# Patient Record
Sex: Female | Born: 1975 | Race: Black or African American | Hispanic: No | Marital: Single | Smoking: Never smoker
Health system: Southern US, Community
[De-identification: ages and names within clinical notes are randomized; demographics above are authoritative.]

## PROBLEM LIST (undated history)

## (undated) DIAGNOSIS — D219 Benign neoplasm of connective and other soft tissue, unspecified: Secondary | ICD-10-CM

## (undated) DIAGNOSIS — D649 Anemia, unspecified: Secondary | ICD-10-CM

## (undated) DIAGNOSIS — N920 Excessive and frequent menstruation with regular cycle: Secondary | ICD-10-CM

## (undated) DIAGNOSIS — G43909 Migraine, unspecified, not intractable, without status migrainosus: Secondary | ICD-10-CM

## (undated) DIAGNOSIS — R87629 Unspecified abnormal cytological findings in specimens from vagina: Secondary | ICD-10-CM

## (undated) HISTORY — PX: WISDOM TOOTH EXTRACTION: SHX21

## (undated) HISTORY — DX: Excessive and frequent menstruation with regular cycle: N92.0

## (undated) HISTORY — DX: Migraine, unspecified, not intractable, without status migrainosus: G43.909

## (undated) HISTORY — DX: Benign neoplasm of connective and other soft tissue, unspecified: D21.9

## (undated) HISTORY — PX: OTHER SURGICAL HISTORY: SHX169

---

## 1998-05-11 ENCOUNTER — Inpatient Hospital Stay (HOSPITAL_COMMUNITY): Admission: AD | Admit: 1998-05-11 | Discharge: 1998-05-11 | Payer: Self-pay | Admitting: *Deleted

## 1998-06-02 ENCOUNTER — Ambulatory Visit (HOSPITAL_COMMUNITY): Admission: RE | Admit: 1998-06-02 | Discharge: 1998-06-02 | Payer: Self-pay | Admitting: Obstetrics

## 1998-06-12 ENCOUNTER — Encounter: Admission: RE | Admit: 1998-06-12 | Discharge: 1998-09-10 | Payer: Self-pay | Admitting: Obstetrics

## 1998-07-03 ENCOUNTER — Ambulatory Visit (HOSPITAL_COMMUNITY): Admission: RE | Admit: 1998-07-03 | Discharge: 1998-07-03 | Payer: Self-pay | Admitting: Obstetrics & Gynecology

## 1998-08-12 ENCOUNTER — Ambulatory Visit (HOSPITAL_COMMUNITY): Admission: RE | Admit: 1998-08-12 | Discharge: 1998-08-12 | Payer: Self-pay | Admitting: Obstetrics & Gynecology

## 1998-09-01 ENCOUNTER — Ambulatory Visit (HOSPITAL_COMMUNITY): Admission: RE | Admit: 1998-09-01 | Discharge: 1998-09-01 | Payer: Self-pay | Admitting: Obstetrics & Gynecology

## 1998-09-18 ENCOUNTER — Encounter: Admission: RE | Admit: 1998-09-18 | Discharge: 1998-09-18 | Payer: Self-pay | Admitting: Obstetrics

## 1998-09-22 ENCOUNTER — Ambulatory Visit (HOSPITAL_COMMUNITY): Admission: RE | Admit: 1998-09-22 | Discharge: 1998-09-22 | Payer: Self-pay | Admitting: Obstetrics & Gynecology

## 1998-09-25 ENCOUNTER — Encounter: Admission: RE | Admit: 1998-09-25 | Discharge: 1998-09-25 | Payer: Self-pay | Admitting: Obstetrics

## 1998-09-29 ENCOUNTER — Encounter (HOSPITAL_COMMUNITY): Admission: RE | Admit: 1998-09-29 | Discharge: 1998-10-22 | Payer: Self-pay | Admitting: Obstetrics

## 1998-09-30 ENCOUNTER — Inpatient Hospital Stay (HOSPITAL_COMMUNITY): Admission: AD | Admit: 1998-09-30 | Discharge: 1998-09-30 | Payer: Self-pay | Admitting: *Deleted

## 1998-10-02 ENCOUNTER — Encounter: Admission: RE | Admit: 1998-10-02 | Discharge: 1998-10-02 | Payer: Self-pay | Admitting: Obstetrics

## 1998-10-02 ENCOUNTER — Inpatient Hospital Stay (HOSPITAL_COMMUNITY): Admission: AD | Admit: 1998-10-02 | Discharge: 1998-10-02 | Payer: Self-pay | Admitting: Obstetrics & Gynecology

## 1998-10-09 ENCOUNTER — Encounter: Admission: RE | Admit: 1998-10-09 | Discharge: 1998-10-09 | Payer: Self-pay | Admitting: Obstetrics

## 1998-10-16 ENCOUNTER — Encounter: Admission: RE | Admit: 1998-10-16 | Discharge: 1998-10-16 | Payer: Self-pay | Admitting: Obstetrics

## 1998-10-20 ENCOUNTER — Inpatient Hospital Stay (HOSPITAL_COMMUNITY): Admission: AD | Admit: 1998-10-20 | Discharge: 1998-10-24 | Payer: Self-pay | Admitting: *Deleted

## 1999-01-19 ENCOUNTER — Emergency Department (HOSPITAL_COMMUNITY): Admission: EM | Admit: 1999-01-19 | Discharge: 1999-01-19 | Payer: Self-pay | Admitting: Emergency Medicine

## 1999-06-29 ENCOUNTER — Inpatient Hospital Stay (HOSPITAL_COMMUNITY): Admission: AD | Admit: 1999-06-29 | Discharge: 1999-07-02 | Payer: Self-pay | Admitting: Obstetrics

## 1999-06-30 ENCOUNTER — Encounter: Payer: Self-pay | Admitting: Obstetrics

## 1999-07-16 ENCOUNTER — Encounter: Admission: RE | Admit: 1999-07-16 | Discharge: 1999-07-16 | Payer: Self-pay | Admitting: Obstetrics

## 2000-02-19 ENCOUNTER — Emergency Department (HOSPITAL_COMMUNITY): Admission: EM | Admit: 2000-02-19 | Discharge: 2000-02-19 | Payer: Self-pay | Admitting: Emergency Medicine

## 2000-06-16 ENCOUNTER — Inpatient Hospital Stay (HOSPITAL_COMMUNITY): Admission: AD | Admit: 2000-06-16 | Discharge: 2000-06-16 | Payer: Self-pay | Admitting: *Deleted

## 2000-12-21 ENCOUNTER — Inpatient Hospital Stay (HOSPITAL_COMMUNITY): Admission: AD | Admit: 2000-12-21 | Discharge: 2000-12-21 | Payer: Self-pay | Admitting: Obstetrics

## 2000-12-27 ENCOUNTER — Other Ambulatory Visit: Admission: RE | Admit: 2000-12-27 | Discharge: 2000-12-27 | Payer: Self-pay | Admitting: Obstetrics and Gynecology

## 2001-02-22 ENCOUNTER — Other Ambulatory Visit: Admission: RE | Admit: 2001-02-22 | Discharge: 2001-02-22 | Payer: Self-pay | Admitting: Obstetrics and Gynecology

## 2001-02-22 ENCOUNTER — Encounter (INDEPENDENT_AMBULATORY_CARE_PROVIDER_SITE_OTHER): Payer: Self-pay

## 2001-05-30 ENCOUNTER — Encounter (INDEPENDENT_AMBULATORY_CARE_PROVIDER_SITE_OTHER): Payer: Self-pay | Admitting: Specialist

## 2001-05-30 ENCOUNTER — Ambulatory Visit (HOSPITAL_COMMUNITY): Admission: RE | Admit: 2001-05-30 | Discharge: 2001-05-30 | Payer: Self-pay | Admitting: Obstetrics and Gynecology

## 2001-06-19 ENCOUNTER — Emergency Department (HOSPITAL_COMMUNITY): Admission: EM | Admit: 2001-06-19 | Discharge: 2001-06-19 | Payer: Self-pay | Admitting: Emergency Medicine

## 2001-09-06 ENCOUNTER — Other Ambulatory Visit: Admission: RE | Admit: 2001-09-06 | Discharge: 2001-09-06 | Payer: Self-pay | Admitting: Obstetrics and Gynecology

## 2002-03-16 ENCOUNTER — Emergency Department (HOSPITAL_COMMUNITY): Admission: EM | Admit: 2002-03-16 | Discharge: 2002-03-17 | Payer: Self-pay | Admitting: Emergency Medicine

## 2002-08-27 ENCOUNTER — Inpatient Hospital Stay (HOSPITAL_COMMUNITY): Admission: AD | Admit: 2002-08-27 | Discharge: 2002-08-27 | Payer: Self-pay | Admitting: Obstetrics and Gynecology

## 2002-08-27 ENCOUNTER — Encounter: Payer: Self-pay | Admitting: Obstetrics and Gynecology

## 2002-08-31 ENCOUNTER — Encounter (INDEPENDENT_AMBULATORY_CARE_PROVIDER_SITE_OTHER): Payer: Self-pay | Admitting: Specialist

## 2002-08-31 ENCOUNTER — Ambulatory Visit (HOSPITAL_COMMUNITY): Admission: RE | Admit: 2002-08-31 | Discharge: 2002-08-31 | Payer: Self-pay | Admitting: Obstetrics and Gynecology

## 2002-10-12 ENCOUNTER — Other Ambulatory Visit: Admission: RE | Admit: 2002-10-12 | Discharge: 2002-10-12 | Payer: Self-pay | Admitting: Obstetrics and Gynecology

## 2003-02-19 ENCOUNTER — Encounter: Admission: RE | Admit: 2003-02-19 | Discharge: 2003-02-19 | Payer: Self-pay | Admitting: Family Medicine

## 2003-03-12 ENCOUNTER — Encounter: Admission: RE | Admit: 2003-03-12 | Discharge: 2003-03-12 | Payer: Self-pay | Admitting: Sports Medicine

## 2003-04-02 ENCOUNTER — Encounter: Admission: RE | Admit: 2003-04-02 | Discharge: 2003-04-02 | Payer: Self-pay | Admitting: Family Medicine

## 2003-04-10 ENCOUNTER — Emergency Department (HOSPITAL_COMMUNITY): Admission: EM | Admit: 2003-04-10 | Discharge: 2003-04-10 | Payer: Self-pay | Admitting: Emergency Medicine

## 2003-05-12 ENCOUNTER — Inpatient Hospital Stay (HOSPITAL_COMMUNITY): Admission: AD | Admit: 2003-05-12 | Discharge: 2003-05-12 | Payer: Self-pay | Admitting: Obstetrics and Gynecology

## 2003-05-28 ENCOUNTER — Encounter: Admission: RE | Admit: 2003-05-28 | Discharge: 2003-05-28 | Payer: Self-pay | Admitting: Family Medicine

## 2003-07-17 ENCOUNTER — Emergency Department (HOSPITAL_COMMUNITY): Admission: EM | Admit: 2003-07-17 | Discharge: 2003-07-17 | Payer: Self-pay | Admitting: Emergency Medicine

## 2003-09-03 ENCOUNTER — Encounter: Admission: RE | Admit: 2003-09-03 | Discharge: 2003-09-03 | Payer: Self-pay | Admitting: Family Medicine

## 2003-09-03 ENCOUNTER — Other Ambulatory Visit: Admission: RE | Admit: 2003-09-03 | Discharge: 2003-09-03 | Payer: Self-pay | Admitting: Family Medicine

## 2003-10-10 ENCOUNTER — Encounter: Admission: RE | Admit: 2003-10-10 | Discharge: 2003-10-10 | Payer: Self-pay | Admitting: Family Medicine

## 2003-10-29 ENCOUNTER — Encounter: Admission: RE | Admit: 2003-10-29 | Discharge: 2003-10-29 | Payer: Self-pay | Admitting: Family Medicine

## 2004-01-01 ENCOUNTER — Encounter: Admission: RE | Admit: 2004-01-01 | Discharge: 2004-01-01 | Payer: Self-pay | Admitting: Family Medicine

## 2004-02-22 ENCOUNTER — Emergency Department (HOSPITAL_COMMUNITY): Admission: AD | Admit: 2004-02-22 | Discharge: 2004-02-22 | Payer: Self-pay

## 2004-02-25 ENCOUNTER — Encounter: Admission: RE | Admit: 2004-02-25 | Discharge: 2004-02-25 | Payer: Self-pay | Admitting: Family Medicine

## 2004-03-24 ENCOUNTER — Encounter: Admission: RE | Admit: 2004-03-24 | Discharge: 2004-03-24 | Payer: Self-pay | Admitting: Sports Medicine

## 2004-05-04 ENCOUNTER — Encounter: Admission: RE | Admit: 2004-05-04 | Discharge: 2004-05-04 | Payer: Self-pay | Admitting: Family Medicine

## 2004-06-08 ENCOUNTER — Encounter: Admission: RE | Admit: 2004-06-08 | Discharge: 2004-06-08 | Payer: Self-pay | Admitting: Family Medicine

## 2004-12-15 ENCOUNTER — Ambulatory Visit: Payer: Self-pay | Admitting: Family Medicine

## 2004-12-29 ENCOUNTER — Ambulatory Visit: Payer: Self-pay | Admitting: Family Medicine

## 2005-02-19 ENCOUNTER — Other Ambulatory Visit: Admission: RE | Admit: 2005-02-19 | Discharge: 2005-02-19 | Payer: Self-pay | Admitting: Gynecology

## 2005-08-12 ENCOUNTER — Ambulatory Visit: Payer: Self-pay | Admitting: Family Medicine

## 2005-11-20 ENCOUNTER — Emergency Department (HOSPITAL_COMMUNITY): Admission: AD | Admit: 2005-11-20 | Discharge: 2005-11-20 | Payer: Self-pay | Admitting: Family Medicine

## 2006-02-02 ENCOUNTER — Other Ambulatory Visit: Admission: RE | Admit: 2006-02-02 | Discharge: 2006-02-02 | Payer: Self-pay | Admitting: Gynecology

## 2006-03-10 ENCOUNTER — Emergency Department (HOSPITAL_COMMUNITY): Admission: EM | Admit: 2006-03-10 | Discharge: 2006-03-10 | Payer: Self-pay | Admitting: Emergency Medicine

## 2006-09-14 ENCOUNTER — Inpatient Hospital Stay (HOSPITAL_COMMUNITY): Admission: AD | Admit: 2006-09-14 | Discharge: 2006-09-15 | Payer: Self-pay | Admitting: Gynecology

## 2006-10-24 ENCOUNTER — Inpatient Hospital Stay (HOSPITAL_COMMUNITY): Admission: AD | Admit: 2006-10-24 | Discharge: 2006-10-24 | Payer: Self-pay | Admitting: Gynecology

## 2007-02-09 DIAGNOSIS — E669 Obesity, unspecified: Secondary | ICD-10-CM

## 2007-02-09 DIAGNOSIS — G44209 Tension-type headache, unspecified, not intractable: Secondary | ICD-10-CM

## 2007-02-13 ENCOUNTER — Emergency Department (HOSPITAL_COMMUNITY): Admission: EM | Admit: 2007-02-13 | Discharge: 2007-02-13 | Payer: Self-pay | Admitting: Family Medicine

## 2007-05-05 ENCOUNTER — Other Ambulatory Visit: Admission: RE | Admit: 2007-05-05 | Discharge: 2007-05-05 | Payer: Self-pay | Admitting: Gynecology

## 2007-09-27 ENCOUNTER — Inpatient Hospital Stay (HOSPITAL_COMMUNITY): Admission: AD | Admit: 2007-09-27 | Discharge: 2007-09-27 | Payer: Self-pay | Admitting: Gynecology

## 2008-02-01 ENCOUNTER — Emergency Department (HOSPITAL_COMMUNITY): Admission: EM | Admit: 2008-02-01 | Discharge: 2008-02-01 | Payer: Self-pay | Admitting: Emergency Medicine

## 2008-05-08 ENCOUNTER — Other Ambulatory Visit: Admission: RE | Admit: 2008-05-08 | Discharge: 2008-05-08 | Payer: Self-pay | Admitting: Gynecology

## 2008-10-17 ENCOUNTER — Emergency Department (HOSPITAL_COMMUNITY): Admission: EM | Admit: 2008-10-17 | Discharge: 2008-10-17 | Payer: Self-pay | Admitting: Emergency Medicine

## 2009-11-24 ENCOUNTER — Emergency Department (HOSPITAL_COMMUNITY): Admission: EM | Admit: 2009-11-24 | Discharge: 2009-11-24 | Payer: Self-pay | Admitting: Emergency Medicine

## 2010-03-09 ENCOUNTER — Ambulatory Visit: Payer: Self-pay | Admitting: Gynecology

## 2010-05-20 ENCOUNTER — Other Ambulatory Visit: Admission: RE | Admit: 2010-05-20 | Discharge: 2010-05-20 | Payer: Self-pay | Admitting: Gynecology

## 2010-05-20 ENCOUNTER — Ambulatory Visit: Payer: Self-pay | Admitting: Gynecology

## 2010-11-10 ENCOUNTER — Ambulatory Visit: Payer: Self-pay | Admitting: Gynecology

## 2010-12-30 ENCOUNTER — Emergency Department (HOSPITAL_BASED_OUTPATIENT_CLINIC_OR_DEPARTMENT_OTHER)
Admission: EM | Admit: 2010-12-30 | Discharge: 2010-12-30 | Payer: Self-pay | Source: Home / Self Care | Admitting: Emergency Medicine

## 2011-03-16 LAB — BASIC METABOLIC PANEL
CO2: 26 mEq/L (ref 19–32)
Chloride: 102 mEq/L (ref 96–112)
GFR calc Af Amer: >60 mL/min (ref >60)
Potassium: 4.2 mEq/L (ref 3.5–5.1)
Sodium: 135 mEq/L (ref 135–145)

## 2011-03-16 LAB — CBC
HCT: 39.8 % (ref 36.0–46.0)
Hemoglobin: 13.7 g/dL (ref 12.0–15.0)
MCV: 93.2 fL (ref 78.0–100.0)
RBC: 4.27 MIL/uL (ref 3.87–5.11)
WBC: 7.1 10*3/uL (ref 4.0–10.5)

## 2011-03-16 LAB — D-DIMER, QUANTITATIVE: D-Dimer, Quant: 0.45 ug/mL-FEU (ref 0.00–0.48)

## 2011-04-30 NOTE — Op Note (Signed)
   NAME:  Kathy Werner, Kathy Werner                          ACCOUNT NO.:  [id number]   MEDICAL RECORD NO.:  [id number]                   PATIENT TYPE:  AMB   LOCATION:  SDC                                  FACILITY:  [facility]   PHYSICIAN:  Naima A. Dillard, M.D.              DATE OF BIRTH:  Oct 03, 1976   DATE OF PROCEDURE:  08/31/2002  DATE OF DISCHARGE:  08/31/2002                                 OPERATIVE REPORT   PREOPERATIVE DIAGNOSIS:  Retained products of conception first trimester.   POSTOPERATIVE DIAGNOSIS:  Incomplete abortion.   PROCEDURE:  Dilation and evacuation.   SURGEON:  Naima A. Normand Sloop, M.D.   ANESTHESIA:  MAC and cervical block.   INTRAVENOUS FLUIDS:  1000 cc crystalloid.   COMPLICATIONS:  None.   FINDINGS:  Eight week size uterus.  The cervix was found to be fingertip  dilated.  There were scant products of conception obtained.  The patient is  AB positive.  The patient went to the recovery room in stable condition.   DESCRIPTION OF PROCEDURE:  The patient was taken to the operating room where  she was given MAC anesthesia, placed in the dorsal lithotomy position and  prepped and draped in a normal sterile fashion.  A bivalve speculum was  placed into the vagina.  The anterior lip of the cervix was grasped with a  single-tooth tenaculum and 10 cc of 1% lidocaine was placed in the  paracervical block.  The cervix was then dilated further with Platt dilators  up to 21-5/7.  Suction curet was then placed in the uterus.  Suction  curettage was done until a gritty texture was noted.  There was only a scant  amount of products of conception in the uterus obtained.  The suction  curettage was then removed.  Sharp curet was placed in the uterus, and there  was a gritty texture noted in the uterus with a sharp curet.  The suction  curettage was then placed back into the uterus to remove any more products  and blood.  All instruments were then removed from the uterus and vagina.  The tenaculum was removed.  Hemostasis was noted at the cervix.  Sponge and  instrument counts were correct x2.  The patient was taken to the recovery  room in stable condition.                                               Naima A. Normand Sloop, M.D.    NAD/MEDQ  D:  08/31/2002  T:  09/02/2002  Job:  [id number]

## 2011-04-30 NOTE — H&P (Signed)
NAME:  Kathy Werner, Kathy Werner                          ACCOUNT NO.:  [id number]   MEDICAL RECORD NO.:  [id number]                   PATIENT TYPE:  AMB   LOCATION:  SDC                                  [facility]:  [facility]   PHYSICIAN:  Naima A. Dillard, M.D.              DATE OF BIRTH:  1976-11-20   DATE OF ADMISSION:  08/31/2002  DATE OF DISCHARGE:                                HISTORY & PHYSICAL   HISTORY OF PRESENT ILLNESS:  The patient is a 35 year old G 7, P 2-0-4-2.  The patient's last menstrual period was 07/13/02.  She presented on the 15th  to [facility] with some heavy vaginal bleeding.  The patient  denied having any severe pain.  The patient was found to have moderate  vaginal bleeding, had a beta hCG of 8762, and she had an ultrasound which  showed the endometrium being 14.4 mm in thickness with a heterogeneous  appearance.  There was a normal right ovary and a normal left ovary with a  collapsed corpus luteum cyst.  The patient was given the option of having a  D&C that night versus returning for another beta quant. 2 days later.  She  did return for another beta quant. 2 days later, which was found to be  1358.8.  However, the patient continued to have heavy vaginal bleeding and  stated that she desired to have a D&C if possible.   PAST MEDICAL HISTORY:  Significant for sciatica.   PAST GYNECOLOGICAL HISTORY:  She had menarche at age 61, occurring every  month, lasting for 5 days.  She does have a history of both trichomonas and  PID.  She does have a history of an abnormal  Pap smear, when she had a  colposcopy and LEEP.   PAST OBSTETRICAL HISTORY:  Significant for elective abortion x3 in the first  trimester without any complication.  She  had a vaginal delivery of twins  without complication.   PAST SURGICAL HISTORY:  Significant for a LEEP procedure of the cervix, and  wisdom teeth.  Also, elective abortion x3.   ALLERGIES:  The patient has no known drug  allergies.   MEDICATIONS:  Motrin p.r.n.   REVIEW OF SYSTEMS:  The patient has no endocrine abnormalities.  The patient  has no cardiovascular or GI abnormalities.  Her GU abnormalities as listed  above.  The patient has no musculoskeletal or neurological weakness or  abnormalities.   PHYSICAL EXAMINATION:  VITAL SIGNS:  She weighs 221 pounds.  Her blood  pressure is 100/62.  HEENT:  Her head is normocephalic and atraumatic.  Her pupils are equal.  Her hearing is normal.  Her throat is clear.  Her thyroid is not enlarged.  HEART:  Regular rate and rhythm.  CHEST:  Clear to auscultation bilaterally.  BREASTS:  Exam deferred as per patient.  BACK:  No CVA tenderness.  ABDOMEN:  Obese,  soft, and nontender.  EXTREMITIES:  No cyanosis, clubbing or edema.  NEUROLOGICAL:  Within normal limits.  PELVIC:  Her vulva and vaginal exams were both within normal limits.  Her  cervix was nontender without any lesions, it was long and closed, but noted  to have moderate vaginal bleeding.  Internal os was closed.  External os was  slightly open.  The uterus was about 8 weeks size and nontender.  The adnexa  had no masses.   The patient's thickened endometrial stripe following beta hCG quants  consistent with SAB.  Due to her thickened endometrial stripe and heavy  vaginal bleeding, patient asked for a D&C.  She may have probable retained  products of conception.   ASSESSMENT:  Probable retained products of conception with moderate vaginal  bleeding after spontaneous abortion.   PLAN:  The patient was given the option of observation with serial beta  quants every week versus a D&C.  The patient has decided to proceed with a  D&C.  The risks of bleeding, infection, damage to internal organs by  perforation and Asherman syndrome were reviewed with the patient.  The  patient understands and still decided to proceed with the D&C.                                               Naima A. Normand Sloop,  M.D.    NAD/MEDQ  D:  08/31/2002  T:  09/01/2002  Job:  [id number]

## 2011-04-30 NOTE — Op Note (Signed)
[facility]  Patient:    Kathy, Werner                       MRN: [id number] Proc. Date: 05/30/01 Adm. Date:  [id number] Attending:  Leonard Schwartz                           Operative Report  PREOPERATIVE DIAGNOSES:       1. Cervical intraepithelial neoplasia I.                               2. 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                               3. Lesion within the endocervical canal.  OPERATION:                    Loop electrosurgical excision procedure.  SURGEON:                      Janine Limbo, M.D.  ANESTHESIA:                   Local Marcaine.  ESTIMATED BLOOD LOSS:         Less than 2 cc.  INDICATIONS:                  Ms. Bracco is a 35 year old female, para 1-0-3-2, who presents with CIN-I and high-risk HPV.  Her cervical lesion was noted to extend into the endocervical canal.  The patient understands the indications for her procedure, and she accepts the risks of, but not limited, anesthetic complications, bleeding, infection, and possible damage to the surrounding organs.  FINDINGS:                     The patients uterus was normal size and consistency.  No abnormal lesions were noted on the exocervix.  DESCRIPTION OF PROCEDURE:     The patient was taken to the operating room, where she was placed in a lithotomy position.  The perineum was sterilely draped.  A speculum was placed in the vagina.  The vagina and the cervix were coated with acetic acid.  Lugols solution was placed on the cervix.  The cervix was injected with 20 cc of a diluted solution of Pitressin, saline, and Marcaine.  Using the LEEP apparatus, we were able to obtain a cone-like specimen.  The specimen was opened at the 12 oclock position.  The patient tolerated her procedure well.  Hemostasis was achieved using the LEEP apparatus.  The patient tolerated that procedure well.  The estimated blood loss was noted to be less than 2 cc.   Hemostasis was noted to be adequate. All instruments were removed.  Prior to removing the instruments, however, the endocervical canal was sounded, and it was noted to be patent.  The patient was taken to the recovery room in stable condition.  The specimen was sent to pathology for official evaluation.  Sponge and instrument counts were correct.  FOLLOW-UP INSTRUCTIONS:  The patient will return to see Dr. Stefano Gaul in two to three weeks for follow-up examination.  She was given a prescription for Vicodin one to two p.o. q.4h. p.r.n. pain.  She was also given a copy of  the postoperative instruction sheet as prepared by the [facility] for patients who have undergone a dilatation and curettage (modified for a LEEP procedure). DD:  05/30/01 TD:  05/30/01 Job: [id number]

## 2011-05-20 ENCOUNTER — Ambulatory Visit: Payer: Self-pay | Admitting: Family Medicine

## 2011-05-27 ENCOUNTER — Ambulatory Visit: Payer: Self-pay | Admitting: Family Medicine

## 2011-05-27 ENCOUNTER — Other Ambulatory Visit: Payer: Self-pay | Admitting: Women's Health

## 2011-05-27 ENCOUNTER — Encounter (INDEPENDENT_AMBULATORY_CARE_PROVIDER_SITE_OTHER): Payer: Managed Care, Other (non HMO) | Admitting: Women's Health

## 2011-05-27 ENCOUNTER — Other Ambulatory Visit (HOSPITAL_COMMUNITY)
Admission: RE | Admit: 2011-05-27 | Discharge: 2011-05-27 | Disposition: A | Discharge status: Home / Self Care | Payer: Managed Care, Other (non HMO) | Source: Ambulatory Visit | Attending: Gynecology | Admitting: Gynecology

## 2011-05-27 DIAGNOSIS — Z01419 Encounter for gynecological examination (general) (routine) without abnormal findings: Secondary | ICD-10-CM

## 2011-05-27 DIAGNOSIS — Z3041 Encounter for surveillance of contraceptive pills: Secondary | ICD-10-CM

## 2011-05-27 DIAGNOSIS — R823 Hemoglobinuria: Secondary | ICD-10-CM

## 2011-05-27 DIAGNOSIS — Z124 Encounter for screening for malignant neoplasm of cervix: Secondary | ICD-10-CM | POA: Insufficient documentation

## 2011-05-27 DIAGNOSIS — Z833 Family history of diabetes mellitus: Secondary | ICD-10-CM

## 2011-08-04 ENCOUNTER — Ambulatory Visit: Payer: Managed Care, Other (non HMO) | Admitting: Women's Health

## 2011-09-15 LAB — POCT I-STAT, CHEM 8
BUN: 9
Calcium, Ion: 1.24
Chloride: 104
Creatinine, Ser: 1.2
Glucose, Bld: 82

## 2011-09-15 LAB — POCT CARDIAC MARKERS: Myoglobin, poc: 30.6

## 2011-09-22 LAB — URINALYSIS, ROUTINE W REFLEX MICROSCOPIC
Glucose, UA: NEGATIVE
Leukocytes, UA: NEGATIVE
Protein, ur: NEGATIVE
Specific Gravity, Urine: <1.005 — ABNORMAL LOW
Urobilinogen, UA: 0.2

## 2011-09-22 LAB — DIFFERENTIAL
Basophils Relative: 1
Eosinophils Absolute: 0
Monocytes Absolute: 0.5
Monocytes Relative: 6

## 2011-09-22 LAB — CBC
Hemoglobin: 12.8
MCHC: 35
MCV: 91.6
RBC: 4

## 2011-09-22 LAB — URINE MICROSCOPIC-ADD ON

## 2011-09-22 LAB — GC/CHLAMYDIA PROBE AMP, GENITAL: Chlamydia, DNA Probe: NEGATIVE

## 2011-10-01 ENCOUNTER — Ambulatory Visit (INDEPENDENT_AMBULATORY_CARE_PROVIDER_SITE_OTHER): Payer: Managed Care, Other (non HMO) | Admitting: Family Medicine

## 2011-10-01 ENCOUNTER — Encounter: Payer: Self-pay | Admitting: Family Medicine

## 2011-10-01 VITALS — BP 109/75 | HR 75 | Temp 99.0°F | Ht 63.0 in | Wt 238.0 lb

## 2011-10-01 DIAGNOSIS — M79673 Pain in unspecified foot: Secondary | ICD-10-CM

## 2011-10-01 DIAGNOSIS — M79672 Pain in left foot: Secondary | ICD-10-CM

## 2011-10-01 DIAGNOSIS — M79609 Pain in unspecified limb: Secondary | ICD-10-CM

## 2011-10-01 MED ORDER — MELOXICAM 15 MG PO TABS: 15.0000 mg | ORAL_TABLET | Freq: Every day | ORAL | Status: AC

## 2011-10-01 MED ORDER — TRAMADOL HCL 50 MG PO TABS: 50.0000 mg | ORAL_TABLET | Freq: Four times a day (QID) | ORAL | Status: AC | PRN

## 2011-10-01 NOTE — Patient Instructions (Signed)
You have plantar fasciitis Take meloxicam 15mg  daily with food for pain and inflammation. Take tramadol 50mg  every 6 hours as needed for severe pain but no driving on this medicine. Plantar fascia stretch for 20-30 seconds (do 3 of these) in morning Lowering/raise on a step exercises 3 x 15 once or twice a day - this is very important for long term recovery (start with 3 x 6 on a book). Start physical therapy AFTER 10 days. Can add heel walks, toe walks forward and backward as well Ice bucket 10-15 minutes at end of day Avoid flat shoes/barefoot walking as much as possible. Arch straps have been shown to help with pain. Heel lifts also help with pain by avoiding fully stretching the plantar fascia except when doing home exercises. Orthotics with heel lift may be helpful. Steroid injection is a consideration for short term pain relief if you are struggling. Follow up with me in 1 month for a recheck.

## 2011-10-03 ENCOUNTER — Encounter: Payer: Self-pay | Admitting: Family Medicine

## 2011-10-03 DIAGNOSIS — M79673 Pain in unspecified foot: Secondary | ICD-10-CM

## 2011-10-03 HISTORY — DX: Pain in unspecified foot: M79.673

## 2011-10-03 NOTE — Progress Notes (Signed)
  Subjective:    Patient ID: Kathy Werner, female    DOB: May 20, 1976, 35 y.o.   MRN: [id number]  PCP: Dr. Nicholos Johns  HPI 35 yo F here for left foot pain.  Patient reports she has had pain in plantar aspect of left foot for several months. Became severe in February - saw her physician as well as [facility] Diagnosed with plantar fasciitis She had cortisone injection which lasted 1 month. Has not had PT. Iced, taken motrin and elevated. Had arch wrapped and has tried several over the counter inserts. Went to [facility] ED. Had x-rays showing calcaneal bone spur. Pain has severely worsened over past month.  Past Medical History  Diagnosis Date  . Migraines     Current Outpatient Prescriptions on File Prior to Visit  Medication Sig Dispense Refill  . BIOTIN PO Take by mouth.        . etonogestrel-ethinyl estradiol (NUVARING) 0.12-0.015 MG/24HR vaginal ring Place 1 each vaginally every 28 (twenty-eight) days. Insert vaginally and leave in place for 3 consecutive weeks, then remove for 1 week.       . SUMAtriptan (IMITREX) 50 MG tablet Take 50 mg by mouth every 2 (two) hours as needed.          History reviewed. No pertinent past surgical history.  No Known Allergies  History   Social History  . Marital Status: Married    Spouse Name: N/A    Number of Children: N/A  . Years of Education: N/A   Occupational History  . Not on file.   Social History Main Topics  . Smoking status: Never Smoker   . Smokeless tobacco: Not on file  . Alcohol Use: No  . Drug Use: Not on file  . Sexually Active: Not on file   Other Topics Concern  . Not on file   Social History Narrative  . No narrative on file    Family History  Problem Relation Age of Onset  . Hypertension Paternal Grandmother   . Sudden death Neg Hx   . Hyperlipidemia Neg Hx   . Heart attack Neg Hx   . Diabetes Neg Hx     BP 109/75  Pulse 75  Temp(Src) 99 F (37.2 C) (Oral)  Ht 5\' 3"  (1.6 m)  Wt 238  lb (107.956 kg)  BMI 42.16 kg/m2  Review of Systems See HPI above.    Objective:   Physical Exam Gen: NAD  L foot: No gross deformity, swelling, or bruising. Mod pes planus. TTP proximal plantar fascia, distal achilles reproducing her pain.  No other TTP about foot/ankle. FROM ankle - pain with dorsiflexion in these areas. Strength 5/5 all motions. Negative ant drawer/talar tilt. NVI distally.  MSK u/s: Small calcaneal spur.  No evidence of thickening, neovascularity, partial tears of plantar fascia or achilles.    Assessment & Plan:  1. Left plantar fasciitis/achilles tendinopathy - Attempted to rest due to severe pain with cam walker and 2 heel lifts but didn't improve her pain.  Start home exercises after 7-10 days of rest on crutches - also start physical therapy.  Icing, heel lifts, arch straps.  Offered repeat cortisone injection but she declined.  See instructions for further.

## 2011-10-03 NOTE — Assessment & Plan Note (Signed)
Left plantar fasciitis/achilles tendinopathy - Attempted to rest due to severe pain with cam walker and 2 heel lifts but didn't improve her pain.  Start home exercises after 7-10 days of rest on crutches - also start physical therapy.  Icing, heel lifts, arch straps.  Offered repeat cortisone injection but she declined.  See instructions for further.

## 2011-10-12 ENCOUNTER — Ambulatory Visit: Payer: Managed Care, Other (non HMO) | Attending: Family Medicine | Admitting: Physical Therapy

## 2011-10-12 DIAGNOSIS — R262 Difficulty in walking, not elsewhere classified: Secondary | ICD-10-CM | POA: Insufficient documentation

## 2011-10-12 DIAGNOSIS — M25676 Stiffness of unspecified foot, not elsewhere classified: Secondary | ICD-10-CM | POA: Insufficient documentation

## 2011-10-12 DIAGNOSIS — M25579 Pain in unspecified ankle and joints of unspecified foot: Secondary | ICD-10-CM | POA: Insufficient documentation

## 2011-10-12 DIAGNOSIS — M25673 Stiffness of unspecified ankle, not elsewhere classified: Secondary | ICD-10-CM | POA: Insufficient documentation

## 2011-10-12 DIAGNOSIS — IMO0001 Reserved for inherently not codable concepts without codable children: Secondary | ICD-10-CM | POA: Insufficient documentation

## 2011-10-26 ENCOUNTER — Ambulatory Visit: Payer: Managed Care, Other (non HMO) | Admitting: Physical Therapy

## 2011-10-28 ENCOUNTER — Ambulatory Visit: Payer: Managed Care, Other (non HMO) | Attending: Family Medicine | Admitting: Physical Therapy

## 2011-10-28 DIAGNOSIS — M25676 Stiffness of unspecified foot, not elsewhere classified: Secondary | ICD-10-CM | POA: Insufficient documentation

## 2011-10-28 DIAGNOSIS — M25579 Pain in unspecified ankle and joints of unspecified foot: Secondary | ICD-10-CM | POA: Insufficient documentation

## 2011-10-28 DIAGNOSIS — M25673 Stiffness of unspecified ankle, not elsewhere classified: Secondary | ICD-10-CM | POA: Insufficient documentation

## 2011-10-28 DIAGNOSIS — R262 Difficulty in walking, not elsewhere classified: Secondary | ICD-10-CM | POA: Insufficient documentation

## 2011-10-28 DIAGNOSIS — IMO0001 Reserved for inherently not codable concepts without codable children: Secondary | ICD-10-CM | POA: Insufficient documentation

## 2011-11-01 ENCOUNTER — Ambulatory Visit: Payer: Managed Care, Other (non HMO) | Admitting: Physical Therapy

## 2011-11-02 ENCOUNTER — Ambulatory Visit: Payer: Managed Care, Other (non HMO) | Admitting: Physical Therapy

## 2011-11-02 ENCOUNTER — Ambulatory Visit: Payer: Managed Care, Other (non HMO) | Admitting: Family Medicine

## 2011-11-08 ENCOUNTER — Encounter: Payer: Managed Care, Other (non HMO) | Admitting: Physical Therapy

## 2011-11-10 ENCOUNTER — Encounter: Payer: Managed Care, Other (non HMO) | Admitting: Physical Therapy

## 2011-12-17 ENCOUNTER — Telehealth: Payer: Self-pay | Admitting: *Deleted

## 2011-12-17 DIAGNOSIS — Z139 Encounter for screening, unspecified: Secondary | ICD-10-CM

## 2011-12-17 NOTE — Telephone Encounter (Signed)
Did do dermatologist check a thyroid and CBC? If not patient should have checked.

## 2011-12-17 NOTE — Telephone Encounter (Signed)
Pt called stating at her annual in June 2012 you call her and told her that her hgb was low(see lab in paper chart)  and you were going to write rx for iron pills? Pt said that she eating a lot of ice and her hair is falling out in patches. She did see her dermatologist and he told her it could be her iron or thyroid. Pt is wanting rx, I told her she can take OTC if needed but I did what to check with you first. Please advise

## 2011-12-17 NOTE — Telephone Encounter (Signed)
Per nancy pt is to take OTC iron daily and eat dark leafy vegetables. Pt was okay with this and will do so.

## 2011-12-20 NOTE — Telephone Encounter (Signed)
Order in computer, pt will make appointment to have labs drawn.

## 2011-12-20 NOTE — Telephone Encounter (Signed)
Addended by: Aura Camps on: 12/20/2011 12:46 PM   Modules accepted: Orders

## 2011-12-23 ENCOUNTER — Other Ambulatory Visit: Payer: Managed Care, Other (non HMO)

## 2011-12-23 DIAGNOSIS — Z139 Encounter for screening, unspecified: Secondary | ICD-10-CM

## 2011-12-23 LAB — CBC
HCT: 34.4 % — ABNORMAL LOW (ref 36.0–46.0)
Hemoglobin: 10.8 g/dL — ABNORMAL LOW (ref 12.0–15.0)
RBC: 3.84 MIL/uL — ABNORMAL LOW (ref 3.87–5.11)
WBC: 7.1 10*3/uL (ref 4.0–10.5)

## 2012-02-08 ENCOUNTER — Encounter (HOSPITAL_COMMUNITY): Payer: Self-pay

## 2012-02-08 ENCOUNTER — Emergency Department (INDEPENDENT_AMBULATORY_CARE_PROVIDER_SITE_OTHER)
Admission: EM | Admit: 2012-02-08 | Discharge: 2012-02-08 | Disposition: A | Discharge status: Home Health | Payer: Managed Care, Other (non HMO) | Source: Home / Self Care | Attending: Emergency Medicine | Admitting: Emergency Medicine

## 2012-02-08 DIAGNOSIS — J069 Acute upper respiratory infection, unspecified: Secondary | ICD-10-CM

## 2012-02-08 MED ORDER — AMOXICILLIN 500 MG PO CAPS: 1000.0000 mg | ORAL_CAPSULE | Freq: Three times a day (TID) | ORAL | Status: AC

## 2012-02-08 MED ORDER — BENZONATATE 200 MG PO CAPS: 200.0000 mg | ORAL_CAPSULE | Freq: Three times a day (TID) | ORAL | Status: AC | PRN

## 2012-02-08 MED ORDER — PREDNISONE 5 MG PO KIT: 1.0000 | PACK | Freq: Every day | ORAL | Status: DC

## 2012-02-08 NOTE — Discharge Instructions (Signed)

## 2012-02-08 NOTE — ED Notes (Signed)
C/o body aches, dental pain, cough, runny nose, head congestion and dental pain since Friday.

## 2012-02-08 NOTE — ED Provider Notes (Signed)
Chief Complaint  Patient presents with  . Sinusitis  . Generalized Body Aches    History of Present Illness:   The patient has had a five-day history of aching, chills, sore throat, hoarseness, nasal congestion, sneezing, rhinorrhea with green drainage, cough productive of small amounts of green sputum, and aching in the chest. She denies any fever.  Review of Systems:  Other than noted above, the patient denies any of the following symptoms. Systemic:  No fever, chills, sweats, fatigue, myalgias, headache, or anorexia. Eye:  No redness, pain or drainage. ENT:  No earache, nasal congestion, rhinorrhea, sinus pressure, or sore throat. Lungs:  No cough, sputum production, wheezing, shortness of breath. Or chest pain. GI:  No nausea, vomiting, abdominal pain or diarrhea. Skin:  No rash or itching.  PMFSH:  Past medical history, family history, social history, meds, and allergies were reviewed.  Physical Exam:   Vital signs:  BP 115/76  Pulse 85  Temp(Src) 98.6 F (37 C) (Oral)  Resp 12  SpO2 99%  LMP 01/28/2012 General:  Alert, in no distress. Eye:  No conjunctival injection or drainage. ENT:  TMs and canals were normal, without erythema or inflammation.  Nasal mucosa was clear and uncongested, without drainage.  Mucous membranes were moist.  Pharynx was clear, without exudate or drainage.  There were no oral ulcerations or lesions. Neck:  Supple, no adenopathy, tenderness or mass. Lungs:  No respiratory distress.  Lungs were clear to auscultation, without wheezes, rales or rhonchi.  Breath sounds were clear and equal bilaterally. Heart:  Regular rhythm, without gallops, murmers or rubs. Skin:  Clear, warm, and dry, without rash or lesions.   Radiology:  No results found.  Assessment:   Diagnoses that have been ruled out:  None  Diagnoses that are still under consideration:  None  Final diagnoses:  Viral upper respiratory infection      Plan:   1.  The following meds were  prescribed:   New Prescriptions   AMOXICILLIN (AMOXIL) 500 MG CAPSULE    Take 2 capsules (1,000 mg total) by mouth 3 (three) times daily.   BENZONATATE (TESSALON) 200 MG CAPSULE    Take 1 capsule (200 mg total) by mouth 3 (three) times daily as needed for cough.   PREDNISONE 5 MG KIT    Take 1 kit (5 mg total) by mouth daily after breakfast. Prednisone 5 mg 6 day dosepack.  Take as directed.   2.  The patient was instructed in symptomatic care and handouts were given. 3.  The patient was told to return if becoming worse in any way, if no better in 3 or 4 days, and given some red flag symptoms that would indicate earlier return.   Roque Lias, MD 02/08/12 [phone]

## 2012-06-29 ENCOUNTER — Telehealth: Payer: Self-pay | Admitting: Women's Health

## 2012-06-29 ENCOUNTER — Ambulatory Visit (INDEPENDENT_AMBULATORY_CARE_PROVIDER_SITE_OTHER): Payer: Managed Care, Other (non HMO) | Admitting: Women's Health

## 2012-06-29 ENCOUNTER — Encounter: Payer: Self-pay | Admitting: Women's Health

## 2012-06-29 VITALS — BP 110/70 | Ht 65.0 in | Wt 237.0 lb

## 2012-06-29 DIAGNOSIS — E079 Disorder of thyroid, unspecified: Secondary | ICD-10-CM

## 2012-06-29 DIAGNOSIS — Z01419 Encounter for gynecological examination (general) (routine) without abnormal findings: Secondary | ICD-10-CM

## 2012-06-29 DIAGNOSIS — G43909 Migraine, unspecified, not intractable, without status migrainosus: Secondary | ICD-10-CM | POA: Insufficient documentation

## 2012-06-29 DIAGNOSIS — Z833 Family history of diabetes mellitus: Secondary | ICD-10-CM

## 2012-06-29 DIAGNOSIS — Z13220 Encounter for screening for lipoid disorders: Secondary | ICD-10-CM

## 2012-06-29 LAB — CBC WITH DIFFERENTIAL/PLATELET
Eosinophils Relative: 1 % (ref 0–5)
HCT: 31.2 % — ABNORMAL LOW (ref 36.0–46.0)
Lymphocytes Relative: 47 % — ABNORMAL HIGH (ref 12–46)
Lymphs Abs: 2.8 10*3/uL (ref 0.7–4.0)
MCV: 84.1 fL (ref 78.0–100.0)
Monocytes Absolute: 0.5 10*3/uL (ref 0.1–1.0)
Platelets: 258 10*3/uL (ref 150–400)
RBC: 3.71 MIL/uL — ABNORMAL LOW (ref 3.87–5.11)
WBC: 6 10*3/uL (ref 4.0–10.5)

## 2012-06-29 LAB — LIPID PANEL
HDL: 53 mg/dL (ref >39)
LDL Cholesterol: 112 mg/dL — ABNORMAL HIGH (ref 0–99)
Triglycerides: 108 mg/dL (ref <150)

## 2012-06-29 LAB — TSH: TSH: 3.18 u[IU]/mL (ref 0.350–4.500)

## 2012-06-29 MED ORDER — SUMATRIPTAN SUCCINATE 50 MG PO TABS: 50.0000 mg | ORAL_TABLET | ORAL | Status: DC | PRN

## 2012-06-29 NOTE — Telephone Encounter (Signed)
Patient informed that per Vonna Kotyk at [facility] that her insurance covers Mirena IUD, insertion and removal at 100%. She does want to proceed with this so she was instructed to call with menses to schedule appointment for insertion.

## 2012-06-29 NOTE — Progress Notes (Signed)
Kathy Werner 06/05/1976 [id number]    History:    The patient presents for annual exam.  Monthly cycles lasting 4 days/heavy flow first 2 days of cycle/condoms. Hx normal Paps. Complains of hair loss X 1-2 years, TSH 3.2 at last visit, has seen dermatologist, has been taking Biotin and prenatal vitamins X 6 weeks. Requesting placement of Mirena IUD, had in past, worked well, had removed due to questionable relationship with hair loss. Currently doing boot camp for weight loss.   Past medical history, past surgical history, family history and social history were all reviewed and documented in the EPIC chart. Hx migraines without aura, has seen headache specialist in past, last migraine 2 weeks ago, relieved with Imitrex. Twins age 60, doing well. Works at [employer] [place].    ROS:  A  ROS was performed and pertinent positives and negatives are included in the history.  Exam:  Filed Vitals:   06/29/12 0917  BP: 110/70    General appearance:  Normal Head/Neck:  Normal, without cervical or supraclavicular adenopathy. Thyroid:  Symmetrical, normal in size, without palpable masses or nodularity. Respiratory  Effort:  Normal  Auscultation:  Clear without wheezing or rhonchi Cardiovascular  Auscultation:  Regular rate, without rubs, murmurs or gallops  Edema/varicosities:  Not grossly evident Abdominal  Soft,nontender, without masses, guarding or rebound.  Liver/spleen:  No organomegaly noted  Hernia:  None appreciated  Skin  Inspection:  Grossly normal  Palpation:  Grossly normal Neurologic/psychiatric  Orientation:  Normal with appropriate conversation.  Mood/affect:  Normal  Genitourinary    Breasts: Examined lying and sitting.     Right: Without masses, retractions, discharge or axillary adenopathy.     Left: Without masses, retractions, discharge or axillary adenopathy.   Inguinal/mons:  Normal without inguinal adenopathy  External genitalia:  Normal  BUS/Urethra/Skene's  glands:  Normal  Bladder:  Normal  Vagina:  Normal  Cervix:  Normal  Uterus: Normal in size, shape and contour.  Midline and mobile  Adnexa/parametria:     Rt: Without masses or tenderness.   Lt: Without masses or tenderness.  Anus and perineum: Normal  Digital rectal exam: Normal sphincter tone without palpated masses or tenderness  Assessment/Plan:  36 y.o.  MBF G3P2 (twins)  for annual exam with complaints of hair loss with no change after Mirena IUD removed.  Normal gyn exam Hair Loss Contraception counseling Obesity Hx migraines without aura  Plan: CBC, Glu, U/A, lipid panel, TSH. No pap, new screening guidelines reviewed. Contraception options reviewed, will check coverage of Mirena IUD, will schedule with Dr. Audie Box with next cycle. SBE's, Ca rich diet, cutting calories for weight loss, exercise, and daily MVI encouraged. Imitrex 50 mg prn, instructions, proper use, prescription given with refill.     Harrington Challenger [facility], 10:13 AM 06/29/2012

## 2012-06-29 NOTE — Patient Instructions (Addendum)

## 2012-06-30 ENCOUNTER — Other Ambulatory Visit: Payer: Self-pay | Admitting: Women's Health

## 2012-06-30 DIAGNOSIS — D649 Anemia, unspecified: Secondary | ICD-10-CM

## 2012-06-30 LAB — URINALYSIS W MICROSCOPIC + REFLEX CULTURE
Bacteria, UA: NONE SEEN
Bilirubin Urine: NEGATIVE
Casts: NONE SEEN
Hgb urine dipstick: NEGATIVE
Ketones, ur: NEGATIVE mg/dL
Nitrite: NEGATIVE
pH: 6 (ref 5.0–8.0)

## 2012-07-04 ENCOUNTER — Other Ambulatory Visit: Payer: Self-pay | Admitting: Gynecology

## 2012-07-04 DIAGNOSIS — Z3049 Encounter for surveillance of other contraceptives: Secondary | ICD-10-CM

## 2012-07-04 MED ORDER — LEVONORGESTREL 20 MCG/24HR IU IUD: INTRAUTERINE_SYSTEM | Freq: Once | INTRAUTERINE | Status: DC

## 2012-07-12 ENCOUNTER — Telehealth: Payer: Self-pay | Admitting: *Deleted

## 2012-07-12 MED ORDER — IBUPROFEN 600 MG PO TABS: 600.0000 mg | ORAL_TABLET | Freq: Three times a day (TID) | ORAL | Status: AC | PRN

## 2012-07-12 NOTE — Telephone Encounter (Signed)
Pt calling requesting Rx for bad menstrual cramps, pt took OTC Midol but no relief. Please advise

## 2012-07-12 NOTE — Telephone Encounter (Signed)
Left on voicemail rx sent and to call if not relief.

## 2012-07-12 NOTE — Telephone Encounter (Signed)
Please call, and call in Motrin [phone] every 8 hours when necessary #[phone] with no refills. Have her call if no relief of dysmenorrhea.

## 2012-07-19 ENCOUNTER — Ambulatory Visit (INDEPENDENT_AMBULATORY_CARE_PROVIDER_SITE_OTHER): Payer: Managed Care, Other (non HMO) | Admitting: Women's Health

## 2012-07-19 ENCOUNTER — Encounter: Payer: Self-pay | Admitting: Women's Health

## 2012-07-19 DIAGNOSIS — N949 Unspecified condition associated with female genital organs and menstrual cycle: Secondary | ICD-10-CM

## 2012-07-19 DIAGNOSIS — N926 Irregular menstruation, unspecified: Secondary | ICD-10-CM

## 2012-07-19 DIAGNOSIS — R110 Nausea: Secondary | ICD-10-CM

## 2012-07-19 DIAGNOSIS — R102 Pelvic and perineal pain: Secondary | ICD-10-CM

## 2012-07-19 LAB — URINALYSIS W MICROSCOPIC + REFLEX CULTURE
Casts: NONE SEEN
Ketones, ur: NEGATIVE mg/dL
Nitrite: NEGATIVE
Protein, ur: NEGATIVE mg/dL
WBC, UA: NONE SEEN WBC/hpf (ref <3)
pH: 7 (ref 5.0–8.0)

## 2012-07-19 LAB — PREGNANCY, URINE: Preg Test, Ur: NEGATIVE

## 2012-07-19 LAB — WET PREP FOR TRICH, YEAST, CLUE: Yeast Wet Prep HPF POC: NONE SEEN

## 2012-07-19 MED ORDER — PROMETHAZINE HCL 25 MG PO TABS: 25.0000 mg | ORAL_TABLET | Freq: Four times a day (QID) | ORAL | Status: DC | PRN

## 2012-07-19 NOTE — Patient Instructions (Addendum)
Viral Gastroenteritis Viral gastroenteritis is also known as stomach flu. This condition affects the stomach and intestinal tract. It can cause sudden diarrhea and vomiting. The illness typically lasts 3 to 8 days. Most people develop an immune response that eventually gets rid of the virus. While this natural response develops, the virus can make you quite ill. CAUSES  Many different viruses can cause gastroenteritis, such as rotavirus or noroviruses. You can catch one of these viruses by consuming contaminated food or water. You may also catch a virus by sharing utensils or other personal items with an infected person or by touching a contaminated surface. SYMPTOMS  The most common symptoms are diarrhea and vomiting. These problems can cause a severe loss of body fluids (dehydration) and a body salt (electrolyte) imbalance. Other symptoms may include:  Fever.   Headache.   Fatigue.   Abdominal pain.  DIAGNOSIS  Your caregiver can usually diagnose viral gastroenteritis based on your symptoms and a physical exam. A stool sample may also be taken to test for the presence of viruses or other infections. TREATMENT  This illness typically goes away on its own. Treatments are aimed at rehydration. The most serious cases of viral gastroenteritis involve vomiting so severely that you are not able to keep fluids down. In these cases, fluids must be given through an intravenous line (IV). HOME CARE INSTRUCTIONS   Drink enough fluids to keep your urine clear or pale yellow. Drink small amounts of fluids frequently and increase the amounts as tolerated.   Ask your caregiver for specific rehydration instructions.   Avoid:   Foods high in sugar.   Alcohol.   Carbonated drinks.   Tobacco.   Juice.   Caffeine drinks.   Extremely hot or cold fluids.   Fatty, greasy foods.   Too much intake of anything at one time.   Dairy products until 24 to 48 hours after diarrhea stops.   You may  consume probiotics. Probiotics are active cultures of beneficial bacteria. They may lessen the amount and number of diarrheal stools in adults. Probiotics can be found in yogurt with active cultures and in supplements.   Wash your hands well to avoid spreading the virus.   Only take over-the-counter or prescription medicines for pain, discomfort, or fever as directed by your caregiver. Do not give aspirin to children. Antidiarrheal medicines are not recommended.   Ask your caregiver if you should continue to take your regular prescribed and over-the-counter medicines.   Keep all follow-up appointments as directed by your caregiver.  SEEK IMMEDIATE MEDICAL CARE IF:   You are unable to keep fluids down.   You do not urinate at least once every 6 to 8 hours.   You develop shortness of breath.   You notice blood in your stool or vomit. This may look like coffee grounds.   You have abdominal pain that increases or is concentrated in one small area (localized).   You have persistent vomiting or diarrhea.   You have a fever.   The patient is a child younger than 3 months, and he or she has a fever.   The patient is a child older than 3 months, and he or she has a fever and persistent symptoms.   The patient is a child older than 3 months, and he or she has a fever and symptoms suddenly get worse.   The patient is a baby, and he or she has no tears when crying.  MAKE SURE YOU:     Understand these instructions.   Will watch your condition.   Will get help right away if you are not doing well or get worse.  Document Released: 11/29/2005 Document Revised: 11/18/2011 Document Reviewed: 09/15/2011 ExitCare Patient Information 2012 [employer]. 

## 2012-07-19 NOTE — Progress Notes (Signed)
Patient ID: GABRIEL PAULDING, female   DOB: December 13, 1976, 36 y.o.   MRN: [id number] Presents with the complaint of nausea with stomach pain for 24 hours, states had to leave work. Has had no vomiting, denies a fever, cold symptoms, headache or vaginal discharge. Denies eating any unusual food, no other family members ill.  Normal cycle on July 30 for 4 days and then spotting several days later. Uses condoms but not consistently for contraception. Is planning to get Mirena IUD, was away with last cycle.  Exam: UA: WBC none, RBCs 3-6, few bacteria, urine culture pending. U PT negative. External genitalia within normal limits, speculum exam no blood noted, scant white discharge wet prep negative. Bimanual no CMT, tenderness at the suprapubic area causing increased nausea.  Possible viral illness with nausea  Plan: Phenergan 25 every 6 hours for nausea. Reviewed will make drowsy, encouraged light foods, increase rest, clear fluids. Work excuse note written. Return to office with next cycle for Dr. Audie Box to place Mirena IUD, which has been ordered. Condoms until placement. Urine culture pending.

## 2012-07-20 ENCOUNTER — Telehealth: Payer: Self-pay | Admitting: Gynecology

## 2012-07-20 ENCOUNTER — Other Ambulatory Visit: Payer: Self-pay | Admitting: Gynecology

## 2012-07-20 DIAGNOSIS — Z3049 Encounter for surveillance of other contraceptives: Secondary | ICD-10-CM

## 2012-07-20 MED ORDER — LEVONORGESTREL 20 MCG/24HR IU IUD: INTRAUTERINE_SYSTEM | Freq: Once | INTRAUTERINE | Status: DC

## 2012-07-20 NOTE — Telephone Encounter (Signed)
Patient informed that Mirena IUD, insertion and removal covered 100%.

## 2012-07-21 ENCOUNTER — Encounter: Payer: Self-pay | Admitting: *Deleted

## 2012-07-21 ENCOUNTER — Telehealth: Payer: Self-pay | Admitting: *Deleted

## 2012-07-21 NOTE — Telephone Encounter (Signed)
Pt was seen 07/19/12 complaint of nausea with stomach pain for 24 hours note was written for missed worked that day, but pt also missed worked on 07/20/12 as well. She asked if you could write another note for work for her missing that day as well? Please advise

## 2012-07-21 NOTE — Telephone Encounter (Signed)
Left message on pt voicemail this has been done letter up front for pick up.

## 2012-07-21 NOTE — Telephone Encounter (Signed)
Yes, please write a note/excuse for missing work on 07/20/12. Also inform urine culture negative no need for a medication. Ask if she is feeling better.

## 2012-12-10 ENCOUNTER — Emergency Department (HOSPITAL_BASED_OUTPATIENT_CLINIC_OR_DEPARTMENT_OTHER)
Admission: EM | Admit: 2012-12-10 | Discharge: 2012-12-10 | Disposition: A | Discharge status: Home / Self Care | Payer: No Typology Code available for payment source | Attending: Emergency Medicine | Admitting: Emergency Medicine

## 2012-12-10 ENCOUNTER — Emergency Department (HOSPITAL_BASED_OUTPATIENT_CLINIC_OR_DEPARTMENT_OTHER): Payer: No Typology Code available for payment source

## 2012-12-10 ENCOUNTER — Encounter (HOSPITAL_BASED_OUTPATIENT_CLINIC_OR_DEPARTMENT_OTHER): Payer: Self-pay | Admitting: *Deleted

## 2012-12-10 DIAGNOSIS — Y9389 Activity, other specified: Secondary | ICD-10-CM | POA: Insufficient documentation

## 2012-12-10 DIAGNOSIS — M25559 Pain in unspecified hip: Secondary | ICD-10-CM

## 2012-12-10 DIAGNOSIS — Z79899 Other long term (current) drug therapy: Secondary | ICD-10-CM | POA: Insufficient documentation

## 2012-12-10 DIAGNOSIS — G43909 Migraine, unspecified, not intractable, without status migrainosus: Secondary | ICD-10-CM | POA: Insufficient documentation

## 2012-12-10 DIAGNOSIS — S79919A Unspecified injury of unspecified hip, initial encounter: Secondary | ICD-10-CM | POA: Insufficient documentation

## 2012-12-10 DIAGNOSIS — Y9289 Other specified places as the place of occurrence of the external cause: Secondary | ICD-10-CM | POA: Insufficient documentation

## 2012-12-10 MED ORDER — IBUPROFEN 800 MG PO TABS: 800.0000 mg | ORAL_TABLET | Freq: Three times a day (TID) | ORAL | Status: DC

## 2012-12-10 MED ORDER — IBUPROFEN 800 MG PO TABS
800.0000 mg | ORAL_TABLET | Freq: Once | ORAL | Status: AC
  Administered 2012-12-10: 800 mg via ORAL
  Filled 2012-12-10: qty 1

## 2012-12-10 NOTE — Discharge Instructions (Signed)
Kathy Werner he did not have a fracture in your hip. Take ibuprofen 800 mg for pain every 6 hours with food x24 hours then as needed. Use ice on the area as well. Followup with her primary care physician of your choice or one from the list below.  RESOURCE GUIDE  Chronic Pain Problems: Contact [facility]  [phone] Patients need to be referred by their primary care doctor.  Insufficient Money for Medicine: Contact United Way:  call "211."   No Primary Care Doctor: - Call [facility]  [phone] - can help you locate a primary care doctor that  accepts your insurance, provides certain services, etc. - Physician Referral Service- [phone]  Agencies that provide inexpensive medical care: - [facility]  [phone] - [facility]  [phone] - [facility]  [phone] - Women's Clinic  [phone] - Planned Parenthood  [phone] - [facility]  [phone]  Medicaid-accepting [facility] Providers: - [facility]- [address]  [phone], Mon-Fri 9am-7pm, Sat 9am-1pm - [facility]- [address] [place], Suite [place]  [phone] - [facility]- [address] Suite [place]  [phone] [facility]- [address]  [phone] - Renaye Rakers- [address] [place], [address], [phone]  Only accepts [place] patients after they have their name  applied to their card  Self Pay (no insurance) in [place]: - Sickle Cell Patients: Dr Willey Blade, [facility]  [address] [place], [phone] - [facility]- [address] [place]  [phone]       [facility] [place]- [address]       -     [facility]- see information above (Speak to [employer] if you do not have insurance)       -  [facility]- [address],  [phone]       -  [facility]- [address] [phone]       -  Dr Julio Sicks-  [address], [place], [phone]       -  [facility] - [address] [phone]       -  [facility]- [address] [phone], also [address] [phone]       -    [facility]- [address] [place], [phone], 1st & 3rd Saturday        every month, 10am-1pm  1) Find a Doctor and Pay Out of Pocket Although you won't have to find out who is covered by your insurance plan, it is a good idea to ask around and get recommendations. You will then need to call the office and see if the doctor you have chosen will accept you as a new patient and what types of options they offer for patients who are self-pay. Some doctors offer discounts or will set up payment plans for their patients who do not have insurance, but you will need to ask so you aren't surprised when you get to your appointment.  2) Contact Your Local Health Department Not all health departments have doctors that can see patients for sick visits, but many do, so it is worth a call to see if yours does. If  you don't know where your local health department is, you can check in your phone book. The CDC also has a tool to help you locate your state's health department, and many state websites also have listings of all of their local health departments.  3) Find a Walk-in Clinic If your illness is not likely to be very severe or complicated, you may want to try a walk in clinic. These are popping up all over the country in pharmacies, drugstores, and shopping centers. They're usually staffed by nurse practitioners or physician assistants that have been trained to treat common illnesses and complaints. They're usually fairly quick and inexpensive. However, if you have serious medical issues or chronic medical problems, these are probably not your best option  STD Testing - [facility] Department of [facility] [place], STD Clinic, [address] [place], phone [phone] or [phone].  Monday - Friday, call for an appointment. [facility] Department of [employer], STD Clinic, [address], [place], phone [phone] or [phone].  Monday - Friday, call for an appointment.  Abuse/Neglect: [facility] Child Abuse Hotline 519-246-2428 [facility] Child Abuse Hotline 289-073-5667 (After Hours)  Emergency Shelter:  [facility] 9731442817  Maternity Homes: - Room at the [facility] 450-697-7648 - [facility] [phone]  MRSA Hotline #:   (203)638-7160  [facility] Resources [facility]  United Way [facility] Dept. [address]                 [address]         [address] [place] [address]  Blondell Reveal Phone:  [phone]                                  Phone:  [phone]                   Phone:  [phone]  [facility], [phone] - [facility] - [facility]- [phone]       -     [facility] in [place], [address]             [phone], Insurance  [place] Child Abuse Hotline 316-617-6129 or 270-341-9211 (After Hours)  Dental Assistance  If unable to pay or uninsured, contact:  [facility] to become qualified for the adult dental clinic.  Patients with Medicaid: [facility] [phone] [address], [phone] [address] [phone]  If unable to pay, or uninsured, contact [facility] [phone] in [place], [phone] in [facility]) to become qualified for the adult dental clinic  Other Low-Cost Community Dental Services: - [facility]- [address] [place], [place], [place], [address], [phone],  [address], 2nd and 4th Thursday of the month at 6:30am.  10 clients each day  by appointment, can sometimes see walk-in patients if someone does not show for an appointment. [facility]- [address] [place], [place], [address], [phone] - [facility] [address], [place], [place], [address], [phone] - [facility]- [phone] [facility]- [phone] [facility] Health [phone]       Behavioral Health Resources in the [facility]  Intensive Outpatient Programs: [facility]      [address] [place], [place] [phone] Both a day and evening program       [facility] Outpatient     [address]        [place], [place] [address] [phone]         ADS: [facility] [address] [place] [phone]  [facility] Mental Health ACCESS LINE: (279)822-3912 or 4060056223 [address] [place], [place] [address] [email or web address]  Behavioral Health Services  Substance Abuse Resources: - [facility] Drug [facility]  (425)[phone] - [facility] [phone] - [facility] [phone] Floydene Flock [phone] - Residential & Outpatient Substance Abuse Program  [phone]  Psychological Services: [facility]  ([phone] [facility] Services  [phone] - [facility], [phone] [place]. [address], [place], ACCESS LINE: (707) 367-7362 or 917-745-1751, [email or web address]  Mobile Crisis Teams:                                        Therapeutic Alternatives         Mobile Crisis Care Unit [phone]             [facility] [address] [place] [phone]                                         Interventionist [address] [place] [address] [place]  [place] [phone]  Self-Help/Support Groups: Mental Health Assoc. of [employer] of support groups 410-548-7291 (call for more info)   Narcotics Anonymous (NA) Caring Services [address] [place] - 2 meetings at this location  Residential Treatment Programs:  [facility]      [address]        [place]       [phone]         [facility] [address], [place] [address] [place], [place]  [address] [phone]  [facility]  [address] [place], [place] [address] [phone] Admissions: 8am-3pm M-F  [facility]     [address]        [place], [place] [address]       [phone]         [facility] [address] [place], [place] [phone]  [facility] [address] [place], [place] [phone]  [facility] - Intensive Outpatient      [address]     [place], [place]       [phone]         [facility] ([facility]     [address] [place], [place] [phone] or [phone]  Residential Treatment Services (RTS), Medicaid [address] [place], [place] [phone]  [facility] [address]                                               [address] [place] [phone]  [facility] Resources: [place]- [phone]               General Therapy                                                Angie Fava, PhD        [address] [place], [place] [address]         [phone]   Insurance  [facility]   [address] [place], [place] [address] [phone]  [facility] Recovery [address] [place], [place] [address] [phone] Insurance/Medicaid/sponsorship through [facility]                                              [address]                                         [place], [place] [address]    Therapy/tele-psych/case         [phone]          [facility] [address], [place]  [address]  Adolescent/group home/case management [phone]                                           Creola Corn PhD       General therapy       Insurance   [phone]         Dr. Lolly Mustache, Insurance, M-F [phone]
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
## 2012-12-12 NOTE — ED Provider Notes (Signed)
Medical screening examination/treatment/procedure(s) were performed by non-physician practitioner and as supervising physician I was immediately available for consultation/collaboration.  Damaso Laday, MD 12/12/12 0953 

## 2013-01-24 ENCOUNTER — Ambulatory Visit (INDEPENDENT_AMBULATORY_CARE_PROVIDER_SITE_OTHER): Payer: Managed Care, Other (non HMO) | Admitting: Gynecology

## 2013-01-24 ENCOUNTER — Encounter: Payer: Self-pay | Admitting: Gynecology

## 2013-01-24 DIAGNOSIS — Z3049 Encounter for surveillance of other contraceptives: Secondary | ICD-10-CM

## 2013-01-24 DIAGNOSIS — N920 Excessive and frequent menstruation with regular cycle: Secondary | ICD-10-CM

## 2013-01-24 MED ORDER — NEOMYCIN-POLYMYXIN-HC 3.5-10000-1 OP SUSP: 1.0000 [drp] | Freq: Four times a day (QID) | OPHTHALMIC | Status: DC

## 2013-01-24 NOTE — Progress Notes (Addendum)
Patient presents for Mirena IUD placement. She had one previously and did well with that. Did have some hair loss which led to removing it but now they feel that it was due to low iron and not the IUD and she wants to go ahead and have the IUD replaced. Her menses are very heavy without the IUD and she was amenorrheic with the IUD.  She has read through the booklet, has no contraindications and signed the consent form. She currently is on a normal menses.  I reviewed the insertional process with her as well as the risks to include infection either immediate or long-term, uterine perforation or migration requiring surgery to remove, other complications such as pain, hormonal side effects and possibilities of failure with subsequent pregnancy.    Exam with Kim assistant Pelvic: External BUS vagina normal. Cervix normal with heavy menses flow. Uterus axial to anteverted normal size shape contour midline mobile nontender. Adnexa without masses or tenderness.  Procedure: The cervix was cleansed with Betadine, anterior lip grasped with a single-tooth tenaculum, the uterus was sounded and a Mirena IUD was placed according to manufacturer's recommendations without difficulty. The strings were trimmed. The patient tolerated well and will follow up in one month for a postinsertional check.  Lot number: [id number]  Addendum: After visit patient asked me about her left eye which is red. On exam she does have erythematous conjunctiva consistent with a conjunctivitis. Did note that the symptoms started several days ago then cleared and now have returned. She's not having any pain drainage or visual changes. We'll treat with Neosporin ophthalmic drops every 6 hours. If her symptoms do not clear within 1-2 days she knows to follow up with her primary physician.

## 2013-01-24 NOTE — Patient Instructions (Signed)
Intrauterine Device Insertion Most often, an intrauterine device (IUD) is inserted into the uterus to prevent pregnancy. There are 2 types of IUDs available:  Copper IUD. This type of IUD creates an environment that is not favorable to sperm survival. The mechanism of action of the copper IUD is not known for certain. It can stay in place for 10 years.  Hormone IUD. This type of IUD contains the hormone progestin (synthetic progesterone). The progestin thickens the cervical mucus and prevents sperm from entering the uterus, and it also thins the uterine lining. There is no evidence that the hormone IUD prevents implantation. The hormone IUD can stay in place for up to 5 years. An IUD is the most cost-effective birth control if left in place for the full duration. It may be removed at any time. LET YOUR CAREGIVER KNOW ABOUT:  Sensitivity to metals.  Medicines taken including herbs, eyedrops, over-the-counter medicines, and creams.  Use of steroids (by mouth or creams).  Previous problems with anesthetics or numbing medicine.  Previous gynecological surgery.  History of blood clots or clotting disorders.  Possibility of pregnancy.  Menstrual irregularities.  Concerns regarding unusual vaginal discharge or odors.  Previous experience with an IUD.  Other health problems. RISKS AND COMPLICATIONS  Accidental puncture (perforation) of the uterus.  Accidental placement of the IUD either in the muscle layer of the uterus (myometrium) or outside the uterus. If this happen, the IUD can be found essentially floating around the bowels. When this happens, the IUD must be taken out surgically.  The IUD may fall out of the uterus (expulsion). This is more common in women who have recently had a child.   Pregnancy in the fallopian tube (ectopic). BEFORE THE PROCEDURE  Schedule the IUD insertion for when you will have your menstrual period or right after, to make sure you are not pregnant.  Placement of the IUD is better tolerated shortly after a menstrual cycle.  You may need to take tests or be examined to make sure you are not pregnant.  You may be required to take a pregnancy test.  You may be required to get checked for sexually transmitted infections (STIs) prior to placement. Placing an IUD in someone who has an infection can make an infection worse.  You may be given a pain reliever to take 1 or 2 hours before the procedure.  An exam will be performed to determine the size and position of your uterus.  Ask your caregiver about changing or stopping your regular medicines. PROCEDURE   A tool (speculum) is placed in the vagina. This allows your caregiver to see the lower part of the uterus (cervix).  The cervix is prepped with a medicine that lowers the risk of infection.  You may be given a medicine to numb each side of the cervix (intracervical or paracervical block). This is used to block and control any discomfort with insertion.  A tool (uterine sound) is inserted into the uterus to determine the length of the uterine cavity and the direction the uterus may be tilted.  A slim instrument (IUD inserter) is inserted through the cervical canal and into your uterus.  The IUD is placed in the uterine cavity and the insertion device is removed.  The nylon string that is attached to the IUD, and used for eventual IUD removal, is trimmed. It is trimmed so that it lays high in the vagina, just outside the cervix. AFTER THE PROCEDURE  You may have bleeding after the  attached to the IUD, and used for eventual IUD removal, is trimmed. It is trimmed so that it lays high in the vagina, just outside the cervix.  AFTER THE PROCEDURE  · You may have bleeding after the procedure. This is normal. It varies from light spotting for a few days to menstrual-like bleeding.   · You may have mild cramping.  · Practice checking the string coming out of the cervix to make sure the IUD remains in the uterus. If you cannot feel the string, you should schedule a "string check" with your caregiver.  · If you had a hormone IUD inserted, expect that your period may be lighter or nonexistent  within a year's time (though this is not always the case). There may be delayed fertility with the hormone IUD as a result of its progesterone effect. When you are ready to become pregnant, it is suggested to have the IUD removed up to 1 year in advance.  · Yearly exams are advised.  Document Released: 07/28/2011 Document Revised: 02/21/2012 Document Reviewed: 07/28/2011  ExitCare® Patient Information ©2013 [employer].

## 2013-01-24 NOTE — Addendum Note (Signed)
Addended by: Dara Lords on: 01/24/2013 09:07 AM   Modules accepted: Orders

## 2013-02-20 ENCOUNTER — Encounter: Payer: Self-pay | Admitting: Gynecology

## 2013-02-20 ENCOUNTER — Ambulatory Visit (INDEPENDENT_AMBULATORY_CARE_PROVIDER_SITE_OTHER): Payer: Managed Care, Other (non HMO) | Admitting: Gynecology

## 2013-02-20 DIAGNOSIS — N949 Unspecified condition associated with female genital organs and menstrual cycle: Secondary | ICD-10-CM

## 2013-02-20 DIAGNOSIS — N938 Other specified abnormal uterine and vaginal bleeding: Secondary | ICD-10-CM

## 2013-02-20 DIAGNOSIS — Z30431 Encounter for routine checking of intrauterine contraceptive device: Secondary | ICD-10-CM

## 2013-02-20 NOTE — Patient Instructions (Signed)
Follow up for annual exam in July

## 2013-02-20 NOTE — Progress Notes (Signed)
Patient presents for IUD followup exam.  Had a Mirena placed 01/2013. Has done well other than spotting on and off since placement. Had intercourse without difficulty and is finishing up a normal menses.  Exam with Selena Batten assistant External BUS vagina with light menses flow. Cervix normal with IUD string visualized appropriate length. Uterus normal size midline mobile nontender. Adnexa without masses or tenderness.  Assessment and plan: Normal IUD check. Some spotting since placement. We'll monitor and assuming it resolves we'll follow. If continues she knows to call me. Due for annual exam in July and I reminded her to schedule this.

## 2013-02-21 ENCOUNTER — Ambulatory Visit: Payer: Managed Care, Other (non HMO) | Admitting: Gynecology

## 2013-05-23 ENCOUNTER — Ambulatory Visit (INDEPENDENT_AMBULATORY_CARE_PROVIDER_SITE_OTHER): Payer: Managed Care, Other (non HMO)

## 2013-05-23 ENCOUNTER — Encounter: Payer: Self-pay | Admitting: Women's Health

## 2013-05-23 ENCOUNTER — Ambulatory Visit (INDEPENDENT_AMBULATORY_CARE_PROVIDER_SITE_OTHER): Payer: Managed Care, Other (non HMO) | Admitting: Women's Health

## 2013-05-23 ENCOUNTER — Other Ambulatory Visit: Payer: Self-pay | Admitting: Women's Health

## 2013-05-23 VITALS — Wt 236.0 lb

## 2013-05-23 DIAGNOSIS — N938 Other specified abnormal uterine and vaginal bleeding: Secondary | ICD-10-CM

## 2013-05-23 DIAGNOSIS — D259 Leiomyoma of uterus, unspecified: Secondary | ICD-10-CM

## 2013-05-23 DIAGNOSIS — N852 Hypertrophy of uterus: Secondary | ICD-10-CM

## 2013-05-23 DIAGNOSIS — D252 Subserosal leiomyoma of uterus: Secondary | ICD-10-CM

## 2013-05-23 DIAGNOSIS — D251 Intramural leiomyoma of uterus: Secondary | ICD-10-CM

## 2013-05-23 DIAGNOSIS — Z975 Presence of (intrauterine) contraceptive device: Secondary | ICD-10-CM

## 2013-05-23 DIAGNOSIS — N8300 Follicular cyst of ovary, unspecified side: Secondary | ICD-10-CM

## 2013-05-23 DIAGNOSIS — N949 Unspecified condition associated with female genital organs and menstrual cycle: Secondary | ICD-10-CM

## 2013-05-23 DIAGNOSIS — T8389XA Other specified complication of genitourinary prosthetic devices, implants and grafts, initial encounter: Secondary | ICD-10-CM

## 2013-05-23 DIAGNOSIS — N921 Excessive and frequent menstruation with irregular cycle: Secondary | ICD-10-CM

## 2013-05-23 MED ORDER — MEGESTROL ACETATE 20 MG PO TABS: ORAL_TABLET | ORAL | Status: DC

## 2013-05-23 NOTE — Progress Notes (Signed)
Patient ID: Kathy Werner, female   DOB: 13-Aug-1976, 37 y.o.   MRN: [id number] Presents with irregular heavy bleeding with clots for 2 weeks of most months since IUD placed February 2014. Had a Mirena IUD in the past and was amenorrheic. Has 35 year old twins does not desire future pregnancies. Denies urinary symptoms, vaginal discharge, odor, abdominal pain or fever.  Appears well, abdomen obese,nontender, external genitalia within normal limits, speculum exam menses type blood with no odor or erythema noted,  IUD strings visible, no CMT limited exam due to abdominal girth.  Ultrasound: IUD in normal position. Transvaginal and abdominal bandages enlarged uterus with subserous and intramural fibroids 50 x 40 mm, 41 x 35 mm 23 x 30 mm 14 x 12 mm 19 x 15 mm 29 x 33 mm 16 x 12 mm 18 x 60 mm. Fibroids encroach cavity anterior and posterior wall. Endometrium 10.3. Right and left ovaries normal.  DUB with Mirena IUD Multiple fibroids  Plan: Megace 20 mg twice daily until bleeding stops. Options reviewed, discussed hysterectomy versus surveillance. Instructed to call if bleeding does not stop.

## 2013-05-23 NOTE — Patient Instructions (Addendum)
Uterine Fibroid A uterine fibroid is a growth (tumor) that occurs in a woman's uterus. This type of tumor is not cancerous and does not spread out of the uterus. A woman can have one or many fibroids, and the fiboid(s) can become quite large. A fibroid can vary in size, weight, and where it grows in the uterus. Most fibroids do not require medical treatment, but some can cause pain or heavy bleeding during and between periods. CAUSES  A fibroid is the result of a single uterine cell that keeps growing (unregulated), which is different than most cells in the human body. Most cells have a control mechanism that keeps them from reproducing without control.  SYMPTOMS   Bleeding.  Pelvic pain and pressure.  Bladder problems due to the size of the fibroid.  Infertility and miscarriages depending on the size and location of the fibroid. DIAGNOSIS  A diagnosis is made by physical exam. Your caregiver may feel the lumpy tumors during a pelvic exam. Important information regarding size, location, and number of tumors can be gained by having an ultrasound. It is rare that other tests, such as a CT scan or MRI, are needed. TREATMENT   Your caregiver may recommend watchful waiting. This involves getting the fibroid checked by your caregiver to see if the fibroids grow or shrink.   Hormonal treatment or an intrauterine device (IUD) may be prescribed.   Surgery may be needed to remove the fibroids (myomectomy) or the uterus (hysterectomy). This depends on your situation. When fibroids interfere with fertility and a woman wants to become pregnant, a caregiver may recommend having the fibroids removed.  HOME CARE INSTRUCTIONS  Home care depends on how you were treated. In general:   Keep all follow-up appointments with your caregiver.   Only take medicine as told by your caregiver. Do not take aspirin. It can cause bleeding.   If you have excessive periods and soak tampons or pads in a half hour or  less, contact your caregiver immediately. If your periods are troublesome but not so heavy, lie down with your feet raised slightly above your heart. Place cold packs on your lower abdomen.   If your periods are heavy, write down the number of pads or tampons you use per month. Bring this information to your caregiver.   Talk to your caregiver about taking iron pills.   Include green vegetables in your diet.   If you were prescribed a hormonal treatment, take the hormonal medicines as directed.   If you need surgery, ask your caregiver for information on your specific surgery.  SEEK IMMEDIATE MEDICAL CARE IF:  You have pelvic pain or cramps not controlled with medicines.   You have a sudden increase in pelvic pain.   You have an increase of bleeding between and during periods.   You feel lightheaded or have fainting episodes.  MAKE SURE YOU:  Understand these instructions.  Will watch your condition.  Will get help right away if you are not doing well or get worse. Document Released: 11/26/2000 Document Revised: 02/21/2012 Document Reviewed: 12/20/2011 ExitCare Patient Information 2014 [employer].  

## 2013-06-12 HISTORY — PX: IUD REMOVAL: SHX5392

## 2013-07-02 ENCOUNTER — Encounter: Payer: Self-pay | Admitting: Women's Health

## 2013-07-02 ENCOUNTER — Ambulatory Visit (INDEPENDENT_AMBULATORY_CARE_PROVIDER_SITE_OTHER): Payer: Managed Care, Other (non HMO) | Admitting: Women's Health

## 2013-07-02 ENCOUNTER — Other Ambulatory Visit (HOSPITAL_COMMUNITY)
Admission: RE | Admit: 2013-07-02 | Discharge: 2013-07-02 | Disposition: A | Discharge status: Home / Self Care | Payer: Managed Care, Other (non HMO) | Source: Ambulatory Visit | Attending: Gynecology | Admitting: Gynecology

## 2013-07-02 VITALS — BP 118/70 | Ht 64.75 in | Wt 232.0 lb

## 2013-07-02 DIAGNOSIS — Z01419 Encounter for gynecological examination (general) (routine) without abnormal findings: Secondary | ICD-10-CM | POA: Insufficient documentation

## 2013-07-02 DIAGNOSIS — Z309 Encounter for contraceptive management, unspecified: Secondary | ICD-10-CM

## 2013-07-02 DIAGNOSIS — IMO0001 Reserved for inherently not codable concepts without codable children: Secondary | ICD-10-CM

## 2013-07-02 DIAGNOSIS — D259 Leiomyoma of uterus, unspecified: Secondary | ICD-10-CM

## 2013-07-02 DIAGNOSIS — D219 Benign neoplasm of connective and other soft tissue, unspecified: Secondary | ICD-10-CM

## 2013-07-02 DIAGNOSIS — Z833 Family history of diabetes mellitus: Secondary | ICD-10-CM

## 2013-07-02 LAB — CBC WITH DIFFERENTIAL/PLATELET
Basophils Relative: 0 % (ref 0–1)
Eosinophils Absolute: 0.1 10*3/uL (ref 0.0–0.7)
MCH: 27.3 pg (ref 26.0–34.0)
MCHC: 32.7 g/dL (ref 30.0–36.0)
Monocytes Relative: 9 % (ref 3–12)
Neutrophils Relative %: 40 % — ABNORMAL LOW (ref 43–77)
Platelets: 269 10*3/uL (ref 150–400)
RDW: 15.5 % (ref 11.5–15.5)

## 2013-07-02 LAB — GLUCOSE, RANDOM: Glucose, Bld: 95 mg/dL (ref 70–99)

## 2013-07-02 MED ORDER — NORETHIN ACE-ETH ESTRAD-FE 1-20 MG-MCG PO TABS: 1.0000 | ORAL_TABLET | Freq: Every day | ORAL | Status: DC

## 2013-07-02 NOTE — Progress Notes (Signed)
Kathy Werner Apr 26, 1976 [id number]    History:    The patient presents for annual exam.  Irregular bleeding with spotting lasting up to 2 weeks Mirena IUD placed February 2014. States has had increased cramping since Mirena placed. Had in the past with minimal bleeding and cramping. Twins are 18, desires no more children. Multiple fibroids largest 5 cm. Normal Pap history. History of mild anemia. Normal lipid panel 2013.  Past medical history, past surgical history, family history and social history were all reviewed and documented in the EPIC chart. Works for [employer] [place]. Healthy parents. Twins [place].   ROS:  A  ROS was performed and pertinent positives and negatives are included in the history.  Exam:  Filed Vitals:   07/02/13 0816  BP: 118/70    General appearance:  Normal Head/Neck:  Normal, without cervical or supraclavicular adenopathy. Thyroid:  Symmetrical, normal in size, without palpable masses or nodularity. Respiratory  Effort:  Normal  Auscultation:  Clear without wheezing or rhonchi Cardiovascular  Auscultation:  Regular rate, without rubs, murmurs or gallops  Edema/varicosities:  Not grossly evident Abdominal  Soft,nontender, without masses, guarding or rebound.  Liver/spleen:  No organomegaly noted  Hernia:  None appreciated  Skin  Inspection:  Grossly normal  Palpation:  Grossly normal Neurologic/psychiatric  Orientation:  Normal with appropriate conversation.  Mood/affect:  Normal  Genitourinary    Breasts: Examined lying and sitting.     Right: Without masses, retractions, discharge or axillary adenopathy.     Left: Without masses, retractions, discharge or axillary adenopathy.   Inguinal/mons:  Normal without inguinal adenopathy  External genitalia:  Normal  BUS/Urethra/Skene's glands:  Normal  Bladder:  Normal  Vagina:  Normal  Cervix:  Normal IUD strings visible,  Uterus:   normal in size, shape and contour.  Midline and  mobile  Adnexa/parametria:     Rt: Without masses or tenderness.   Lt: Without masses or tenderness.  Anus and perineum: Normal  Digital rectal exam: Normal sphincter tone without palpated masses or tenderness  Assessment/Plan:  37 y.o. MBF G1 P2 for annual exam.    DUB and dysmenorrhea with Mirena IUD Fibroids Obesity  Plan: IUD removed intact shown to patient and discarded. Options reviewed, Loestrin 1/20 prescription, proper use given and reviewed slight risk for blood clots and strokes. Has used in the past. Condoms first month. Start up instructions reviewed. Instructed to call if no relief of DUB and dysmenorrhea. Hysterectomy discussed do to age and multiple fibroids. SBE's, increase regular exercise and decrease calories for weight loss, calcium rich diet, vitamin D 1000 daily And MVI with iron encouraged. CBC, glucose, UA, Pap. Pap normal 2012, new screening guidelines reviewed.    Harrington Challenger WHNP, 1:15 PM 07/02/2013

## 2013-07-02 NOTE — Patient Instructions (Signed)

## 2013-07-03 LAB — URINALYSIS W MICROSCOPIC + REFLEX CULTURE
Casts: NONE SEEN
Ketones, ur: NEGATIVE mg/dL
Nitrite: NEGATIVE
pH: 6 (ref 5.0–8.0)

## 2013-07-04 ENCOUNTER — Other Ambulatory Visit: Payer: Self-pay | Admitting: Women's Health

## 2013-07-04 DIAGNOSIS — D649 Anemia, unspecified: Secondary | ICD-10-CM

## 2013-07-04 LAB — URINE CULTURE: Colony Count: 8000

## 2013-12-18 ENCOUNTER — Emergency Department (INDEPENDENT_AMBULATORY_CARE_PROVIDER_SITE_OTHER): Payer: Managed Care, Other (non HMO)

## 2013-12-18 ENCOUNTER — Emergency Department (HOSPITAL_COMMUNITY)
Admission: EM | Admit: 2013-12-18 | Discharge: 2013-12-18 | Disposition: A | Discharge status: Home / Self Care | Payer: Managed Care, Other (non HMO) | Source: Home / Self Care | Attending: Family Medicine | Admitting: Family Medicine

## 2013-12-18 ENCOUNTER — Encounter (HOSPITAL_COMMUNITY): Payer: Self-pay | Admitting: Emergency Medicine

## 2013-12-18 ENCOUNTER — Emergency Department (HOSPITAL_COMMUNITY): Payer: Managed Care, Other (non HMO)

## 2013-12-18 DIAGNOSIS — B9789 Other viral agents as the cause of diseases classified elsewhere: Principal | ICD-10-CM

## 2013-12-18 DIAGNOSIS — J069 Acute upper respiratory infection, unspecified: Secondary | ICD-10-CM

## 2013-12-18 MED ORDER — HYDROCOD POLST-CHLORPHEN POLST 10-8 MG/5ML PO LQCR: 5.0000 mL | Freq: Two times a day (BID) | ORAL | Status: DC | PRN

## 2013-12-18 MED ORDER — BENZONATATE 200 MG PO CAPS: 200.0000 mg | ORAL_CAPSULE | Freq: Three times a day (TID) | ORAL | Status: DC | PRN

## 2013-12-18 MED ORDER — AEROCHAMBER PLUS FLO-VU LARGE MISC: 1.0000 | Freq: Once | Status: DC

## 2013-12-18 MED ORDER — ALBUTEROL SULFATE HFA 108 (90 BASE) MCG/ACT IN AERS: 1.0000 | INHALATION_SPRAY | Freq: Four times a day (QID) | RESPIRATORY_TRACT | Status: DC | PRN

## 2013-12-18 NOTE — Discharge Instructions (Signed)

## 2013-12-18 NOTE — ED Notes (Signed)
Pt c/o cold sxs onset 4 days w/sxs that include: HA, chest d/c due to productive cough, BA, SOB Denies: f/v/n/d, wheezing... Has taken Nyquil and Mucinex OTC w/no relief She is alert w/no signs of acute distress.

## 2013-12-18 NOTE — ED Provider Notes (Signed)
CSN: [id number]     Arrival date & time 12/18/13  1017 History   First MD Initiated Contact with Patient 12/18/13 1054     Chief Complaint  Patient presents with  . URI   (Consider location/radiation/quality/duration/timing/severity/associated sxs/prior Treatment) HPI Comments: 38 year old female presents complaining of cough and cold symptoms for 4 days. She is experiencing headache, chest pain with coughing, shortness of breath, loss of her voice. She's been taking over-the-counter medications but her cough has been severe, keeping her awake most of the night for the past couple of nights. Initially, the chest pain was only with coughing but has become constant. It is substernal, nonradiating. There is no nausea, vomiting, diaphoresis. She does have very mild shortness of breath, described as a sense of breathlessness.   Past Medical History  Diagnosis Date  . Migraines   . Fibroid    Past Surgical History  Procedure Laterality Date  . Mirena      Inserted 01-24-13   Family History  Problem Relation Age of Onset  . Hypertension Paternal Grandmother   . Sudden death Neg Hx   . Hyperlipidemia Neg Hx   . Heart attack Neg Hx   . Diabetes Neg Hx    History  Substance Use Topics  . Smoking status: Never Smoker   . Smokeless tobacco: Never Used  . Alcohol Use: No   OB History   Grav Para Term Preterm Abortions TAB SAB Ect Mult Living   3    1  1  1 2      Review of Systems  Constitutional: Negative for fever and chills.  HENT: Positive for sore throat.   Eyes: Negative for visual disturbance.  Respiratory: Positive for cough, chest tightness and shortness of breath.   Cardiovascular: Positive for chest pain. Negative for palpitations and leg swelling.  Gastrointestinal: Negative for nausea, vomiting and abdominal pain.  Endocrine: Negative for polydipsia and polyuria.  Genitourinary: Negative for dysuria, urgency and frequency.  Musculoskeletal: Positive for arthralgias and  myalgias.  Skin: Negative for rash.  Neurological: Positive for headaches. Negative for dizziness, weakness and light-headedness.    Allergies  Review of patient's allergies indicates no known allergies.  Home Medications   Current Outpatient Rx  Name  Route  Sig  Dispense  Refill  . albuterol (PROVENTIL HFA;VENTOLIN HFA) 108 (90 BASE) MCG/ACT inhaler   Inhalation   Inhale 1-2 puffs into the lungs every 6 (six) hours as needed for wheezing or shortness of breath.   1 Inhaler   0   . benzonatate (TESSALON) 200 MG capsule   Oral   Take 1 capsule (200 mg total) by mouth 3 (three) times daily as needed for cough.   20 capsule   0   . chlorpheniramine-HYDROcodone (TUSSIONEX PENNKINETIC ER) 10-8 MG/5ML LQCR   Oral   Take 5 mLs by mouth every 12 (twelve) hours as needed for cough.   115 mL   0   . ibuprofen (ADVIL,MOTRIN) 800 MG tablet   Oral   Take 1 tablet (800 mg total) by mouth 3 (three) times daily.   21 tablet   0   . megestrol (MEGACE) 20 MG tablet      Take twice daily until bleeding stops   30 tablet   0   . norethindrone-ethinyl estradiol (JUNEL FE,GILDESS FE,LOESTRIN FE) 1-20 MG-MCG tablet   Oral   Take 1 tablet by mouth daily.   1 Package   12   . Spacer/Aero-Holding Chambers (AEROCHAMBER PLUS FLO-VU  LARGE) MISC   Other   1 each by Other route once.   1 each   0   . SUMAtriptan (IMITREX) 50 MG tablet   Oral   Take 1 tablet (50 mg total) by mouth every 2 (two) hours as needed.   10 tablet   6    BP 115/69  Pulse 86  Temp(Src) 98.6 F (37 C) (Oral)  Resp 18  SpO2 98%  LMP 12/06/2013 Physical Exam  Nursing note and vitals reviewed. Constitutional: She is oriented to person, place, and time. Vital signs are normal. She appears well-developed and well-nourished. No distress.  HENT:  Head: Normocephalic and atraumatic.  Nose: Nose normal.  Mouth/Throat: Oropharynx is clear and moist. No oropharyngeal exudate.  Eyes: Conjunctivae are normal.  Right eye exhibits no discharge. Left eye exhibits no discharge.  Neck: Normal range of motion.  Cardiovascular: Normal rate, regular rhythm and normal heart sounds.  Exam reveals no gallop.   No murmur heard. Pulmonary/Chest: Effort normal and breath sounds normal. No respiratory distress. She has no wheezes. She has no rales.  Lymphadenopathy:    She has no cervical adenopathy.  Neurological: She is alert and oriented to person, place, and time. She has normal strength. Coordination normal.  Skin: Skin is warm and dry. No rash noted. She is not diaphoretic.  Psychiatric: She has a normal mood and affect. Judgment normal.    ED Course  Procedures (including critical care time) Labs Review Labs Reviewed - No data to display Imaging Review Dg Chest 2 View  12/18/2013   CLINICAL DATA:  Cough.  Mid chest pain.  EXAM: CHEST  2 VIEW  COMPARISON:  DG CHEST 2 VIEW dated 11/24/2009; DG CHEST 2 VIEW dated 10/17/2008; DG CHEST 2 VIEW dated 02/01/2008  FINDINGS: The heart size and mediastinal contours are within normal limits. Both lungs are clear. The visualized skeletal structures are unremarkable.  IMPRESSION: No active cardiopulmonary disease.   Electronically Signed   By: Sherryl Barters M.D.   On: 12/18/2013 12:17      MDM   1. Viral URI with cough    CXR normal.  Treat symptomatically, f/u if any worsening    Meds ordered this encounter  Medications  . chlorpheniramine-HYDROcodone (TUSSIONEX PENNKINETIC ER) 10-8 MG/5ML LQCR    Sig: Take 5 mLs by mouth every 12 (twelve) hours as needed for cough.    Dispense:  115 mL    Refill:  0    Order Specific Question:  Supervising Provider    Answer:  Jake Michaelis, DAVID C [id number]  . benzonatate (TESSALON) 200 MG capsule    Sig: Take 1 capsule (200 mg total) by mouth 3 (three) times daily as needed for cough.    Dispense:  20 capsule    Refill:  0    Order Specific Question:  Supervising Provider    Answer:  Jake Michaelis, DAVID C [id number]  . albuterol  (PROVENTIL HFA;VENTOLIN HFA) 108 (90 BASE) MCG/ACT inhaler    Sig: Inhale 1-2 puffs into the lungs every 6 (six) hours as needed for wheezing or shortness of breath.    Dispense:  1 Inhaler    Refill:  0    Order Specific Question:  Supervising Provider    Answer:  Jake Michaelis, DAVID C [id number]  . Spacer/Aero-Holding Chambers (AEROCHAMBER PLUS FLO-VU LARGE) MISC    Sig: 1 each by Other route once.    Dispense:  1 each    Refill:  0    Order Specific  Question:  Supervising Provider    Answer:  Harden Mo [[id number]]   \    Liam Graham, PA-C 12/18/13 1231

## 2013-12-19 NOTE — ED Provider Notes (Signed)
Medical screening examination/treatment/procedure(s) were performed by a resident physician or non-physician practitioner and as the supervising physician I was immediately available for consultation/collaboration.  Lynne Leader, MD    Gregor Hams, MD 12/19/13 [id number]

## 2014-03-13 DIAGNOSIS — N920 Excessive and frequent menstruation with regular cycle: Secondary | ICD-10-CM

## 2014-03-13 HISTORY — DX: Excessive and frequent menstruation with regular cycle: N92.0

## 2014-04-16 ENCOUNTER — Ambulatory Visit (INDEPENDENT_AMBULATORY_CARE_PROVIDER_SITE_OTHER): Payer: Managed Care, Other (non HMO) | Admitting: Women's Health

## 2014-04-16 ENCOUNTER — Encounter: Payer: Self-pay | Admitting: Women's Health

## 2014-04-16 DIAGNOSIS — B3731 Acute candidiasis of vulva and vagina: Secondary | ICD-10-CM

## 2014-04-16 DIAGNOSIS — D649 Anemia, unspecified: Secondary | ICD-10-CM

## 2014-04-16 DIAGNOSIS — B373 Candidiasis of vulva and vagina: Secondary | ICD-10-CM

## 2014-04-16 MED ORDER — FLUCONAZOLE 100 MG PO TABS: ORAL_TABLET | ORAL | Status: DC

## 2014-04-16 MED ORDER — FERROUS FUMARATE-FOLIC ACID 324-1 MG PO TABS: ORAL_TABLET | ORAL | Status: DC

## 2014-04-16 NOTE — Patient Instructions (Signed)

## 2014-04-16 NOTE — Progress Notes (Signed)
Patient ID: Kathy Werner, female   DOB: 1976-08-14, 38 y.o.   MRN: [id number] Presents with complaint of burning and itching most months at the onset of menses with minimal discharge. Cycle started today, heavy. Denies vaginal odor, pain or burning with urination, or fever. Monthly cycle for 5 days 3 of which are heavy, history of numerous fibroids ranging from 2-5 cm. Mother and numerous other family members history of fibroids.  Had Mirena IUD  February 2014 removed 06/2013 - menorrhagia and dysmenorrhea. Tried Loestrin, reported nausea and stopped. Condoms. States craves ice all day, history anemia. Twins are 24 doing well.  Exam: Appears well. External genitalia slightly erythematous, speculum exam copious menses, no odor noted, wet prep not done.  Symptomatic yeast Fibroid uterus with menorrhagia  Plan: Diflucan 100  2 tablets today and one tablet monthly with cycle if needed. Instructed to call if no relief of symptoms. Options reviewed, declines OC's or NuvaRing, will continue condoms. Motrin 600 every 8 hours today. Discussed ablation, her option information given, reviewed sonohysterogram with Dr. Phineas Real prior to procedure if would like to proceed. CBC, iron supplement daily, increase iron rich foods and diet.

## 2014-04-17 ENCOUNTER — Telehealth: Payer: Self-pay

## 2014-04-17 ENCOUNTER — Other Ambulatory Visit: Payer: Self-pay | Admitting: Women's Health

## 2014-04-17 DIAGNOSIS — D500 Iron deficiency anemia secondary to blood loss (chronic): Secondary | ICD-10-CM

## 2014-04-17 LAB — CBC WITH DIFFERENTIAL/PLATELET
Basophils Absolute: 0 10*3/uL (ref 0.0–0.1)
Basophils Relative: 0 % (ref 0–1)
Eosinophils Absolute: 0.1 10*3/uL (ref 0.0–0.7)
Eosinophils Relative: 1 % (ref 0–5)
HCT: 24.9 % — ABNORMAL LOW (ref 36.0–46.0)
HEMOGLOBIN: 7.4 g/dL — AB (ref 12.0–15.0)
Lymphocytes Relative: 43 % (ref 12–46)
Lymphs Abs: 3 10*3/uL (ref 0.7–4.0)
MCH: 20.1 pg — ABNORMAL LOW (ref 26.0–34.0)
MCHC: 29.7 g/dL — AB (ref 30.0–36.0)
MCV: 67.7 fL — ABNORMAL LOW (ref 78.0–100.0)
MONOS PCT: 7 % (ref 3–12)
Monocytes Absolute: 0.5 10*3/uL (ref 0.1–1.0)
NEUTROS PCT: 49 % (ref 43–77)
Neutro Abs: 3.4 10*3/uL (ref 1.7–7.7)
PLATELETS: 282 10*3/uL (ref 150–400)
RBC: 3.68 MIL/uL — ABNORMAL LOW (ref 3.87–5.11)
RDW: 19 % — ABNORMAL HIGH (ref 11.5–15.5)
WBC: 7 10*3/uL (ref 4.0–10.5)

## 2014-04-17 MED ORDER — NORETHIN ACE-ETH ESTRAD-FE 1-20 MG-MCG PO TABS: 1.0000 | ORAL_TABLET | Freq: Every day | ORAL | Status: DC

## 2014-04-17 NOTE — Telephone Encounter (Signed)
Patient was very alarmed by her low hgb.  She asked if you thought OC's might help.  She said she is willing to take them and deal with side affects (nausea) in order to keep her hgb from dropping any lower.

## 2014-04-17 NOTE — Telephone Encounter (Signed)
Patient advised of all below.  Rx sent. She was reminded will still need to recheck CBC in 8 weeks.

## 2014-04-17 NOTE — Telephone Encounter (Signed)
Message copied by Ramond Craver on Wed Apr 17, 2014 11:01 AM ------      Message from: [place], [place] J      Created: Wed Apr 17, 2014  8:08 AM       Please call and inform CBC is dangerously low, hemoglobin and hematocrit 7.4 and 24.9. She had suspected anemia has had in the past. Iron supplements twice daily, increase iron rich foods, most likely for menorrhagia, fibroid uterus. We need to think about treatment to prevent, either ablation or hysterectomy. Unable to tolerate IUD or OCs. Recheck CBC in 2 months to make sure coming up. I have given her iron supplement prescription yesterday. ------

## 2014-04-17 NOTE — Telephone Encounter (Signed)
Ok, loestrin 1/20 daily, start today, cycle started yesterday. Review sl risk for clots/strokes, has been on in the past. Take daily with food, maybe dinner time best time to try to take. Call if problems  Thanks, still need to recheck CBC in 2 months.

## 2014-07-11 ENCOUNTER — Other Ambulatory Visit: Payer: Self-pay

## 2014-07-11 MED ORDER — NORETHIN ACE-ETH ESTRAD-FE 1-20 MG-MCG PO TABS: 1.0000 | ORAL_TABLET | Freq: Every day | ORAL | Status: DC

## 2014-07-11 NOTE — Telephone Encounter (Signed)
Pharmacy requesting 90 day supply per ins.

## 2014-08-12 ENCOUNTER — Telehealth: Payer: Self-pay | Admitting: Oncology

## 2014-08-12 NOTE — Telephone Encounter (Signed)
LEFT MESSAGE FOR PATIENT TO RETURN CALL TO SCHEDULE NP APPT.  °

## 2014-08-13 ENCOUNTER — Telehealth: Payer: Self-pay | Admitting: Oncology

## 2014-08-13 NOTE — Telephone Encounter (Signed)
LEFT MESSAGE FOR PATIENT TO RETURN CALL TO SCHEDULE NP APPT.  °

## 2014-08-13 NOTE — Telephone Encounter (Signed)
C/D 08/13/14 for appt. 08/22/14

## 2014-08-13 NOTE — Telephone Encounter (Signed)
S/W PATIENT AND GAVE NP APPT FOR 09/10 @ 1:30 W/DR. SHADAD.  REFERRING DR. Anderson Malta BROWN DX- IDA

## 2014-08-19 ENCOUNTER — Other Ambulatory Visit: Payer: Self-pay | Admitting: Oncology

## 2014-08-19 DIAGNOSIS — D539 Nutritional anemia, unspecified: Secondary | ICD-10-CM

## 2014-08-21 ENCOUNTER — Telehealth: Payer: Self-pay | Admitting: Medical Oncology

## 2014-08-21 NOTE — Telephone Encounter (Signed)
Confirmation call to patient regarding tomorrow's appts. Directions to [facility] given. Patient states not taking any medications except Iron pills. Denies further questions at this time.

## 2014-08-22 ENCOUNTER — Telehealth: Payer: Self-pay | Admitting: Oncology

## 2014-08-22 ENCOUNTER — Encounter: Payer: Self-pay | Admitting: Oncology

## 2014-08-22 ENCOUNTER — Other Ambulatory Visit (HOSPITAL_BASED_OUTPATIENT_CLINIC_OR_DEPARTMENT_OTHER): Payer: Managed Care, Other (non HMO)

## 2014-08-22 ENCOUNTER — Ambulatory Visit (HOSPITAL_BASED_OUTPATIENT_CLINIC_OR_DEPARTMENT_OTHER): Payer: Managed Care, Other (non HMO) | Admitting: Oncology

## 2014-08-22 ENCOUNTER — Ambulatory Visit: Payer: Managed Care, Other (non HMO)

## 2014-08-22 VITALS — BP 103/43 | HR 69 | Temp 98.6°F | Resp 19 | Ht 64.75 in | Wt 242.0 lb

## 2014-08-22 DIAGNOSIS — N920 Excessive and frequent menstruation with regular cycle: Secondary | ICD-10-CM

## 2014-08-22 DIAGNOSIS — D259 Leiomyoma of uterus, unspecified: Secondary | ICD-10-CM

## 2014-08-22 DIAGNOSIS — D539 Nutritional anemia, unspecified: Secondary | ICD-10-CM

## 2014-08-22 DIAGNOSIS — D509 Iron deficiency anemia, unspecified: Secondary | ICD-10-CM

## 2014-08-22 LAB — CBC WITH DIFFERENTIAL/PLATELET
BASO%: 1 % (ref 0.0–2.0)
Basophils Absolute: 0.1 10*3/uL (ref 0.0–0.1)
EOS%: 1.3 % (ref 0.0–7.0)
Eosinophils Absolute: 0.1 10*3/uL (ref 0.0–0.5)
HCT: 25.2 % — ABNORMAL LOW (ref 34.8–46.6)
HGB: 7.3 g/dL — ABNORMAL LOW (ref 11.6–15.9)
LYMPH%: 35.8 % (ref 14.0–49.7)
MCH: 20.9 pg — ABNORMAL LOW (ref 25.1–34.0)
MCHC: 29.2 g/dL — ABNORMAL LOW (ref 31.5–36.0)
MCV: 71.6 fL — AB (ref 79.5–101.0)
MONO#: 0.7 10*3/uL (ref 0.1–0.9)
MONO%: 9.4 % (ref 0.0–14.0)
NEUT#: 4.2 10*3/uL (ref 1.5–6.5)
NEUT%: 52.5 % (ref 38.4–76.8)
Platelets: 400 10*3/uL (ref 145–400)
RBC: 3.52 10*6/uL — ABNORMAL LOW (ref 3.70–5.45)
RDW: 23 % — ABNORMAL HIGH (ref 11.2–14.5)
WBC: 7.9 10*3/uL (ref 3.9–10.3)
lymph#: 2.8 10*3/uL (ref 0.9–3.3)

## 2014-08-22 LAB — COMPREHENSIVE METABOLIC PANEL (CC13)
ALT: 16 U/L (ref 0–55)
ANION GAP: 7 meq/L (ref 3–11)
AST: 19 U/L (ref 5–34)
Albumin: 3.4 g/dL — ABNORMAL LOW (ref 3.5–5.0)
Alkaline Phosphatase: 83 U/L (ref 40–150)
BILIRUBIN TOTAL: 0.57 mg/dL (ref 0.20–1.20)
BUN: 10.6 mg/dL (ref 7.0–26.0)
CO2: 25 mEq/L (ref 22–29)
CREATININE: 1 mg/dL (ref 0.6–1.1)
Calcium: 9 mg/dL (ref 8.4–10.4)
Chloride: 109 mEq/L (ref 98–109)
GLUCOSE: 82 mg/dL (ref 70–140)
Potassium: 4.1 mEq/L (ref 3.5–5.1)
Sodium: 141 mEq/L (ref 136–145)
Total Protein: 7.3 g/dL (ref 6.4–8.3)

## 2014-08-22 LAB — IRON AND TIBC CHCC
%SAT: 31 % (ref 21–57)
IRON: 125 ug/dL (ref 41–142)
TIBC: 404 ug/dL (ref 236–444)
UIBC: 279 ug/dL (ref 120–384)

## 2014-08-22 LAB — FERRITIN CHCC: FERRITIN: 13 ng/mL (ref 9–269)

## 2014-08-22 NOTE — Telephone Encounter (Signed)
gv pt appt schedule for dec.  °

## 2014-08-22 NOTE — Progress Notes (Signed)
Please see consult note.  

## 2014-08-22 NOTE — Consult Note (Signed)
Reason for Referral: Iron deficiency anemia.   HPI: 38 year old African American woman native of [place] currently living in this area for the last 16 years. She is rather healthy woman without any significant comorbid conditions. She was diagnosed with a uterine fibroids and followed by OB/GYN regarding that. She developed a menorrhagia that led to the diagnosis of fibroids. Most recently, she started developing symptoms of excessive fatigue and tiredness as well as headache and dizziness. She started developing ice cravings and most recently had a CBC done in May of 2015 showed a hemoglobin of 7.4 MCV 67 and platelet count of 282. She was started on over-the-counter iron without any significant improvement in her hemoglobin and most recently was started on prescription iron 3 times a day. She tolerated it well with some occasional nausea and abdominal pain. No significant improvement in her symptoms at this point. She denies any hematochezia or melena. Clinically, she does not report any blurred vision or syncope. She did not report any seizures. She did not report any chest pain or palpitation but does report exertional dyspnea. She did not report any cough or wheezing. He did not report any nausea or vomiting but does report some occasional abdominal pain and dyspepsia related to the iron. She has not reported any frequency, urgency or hematuria. Did not report any skeletal complaints of arthralgias or myalgias. Despite anemia, continues to work full-time and attends to her activities of daily living. Rest of the review of systems unremarkable.   Past Medical History  Diagnosis Date  . Migraines   . Fibroid   . Menorrhagia 03/2014  :  Past Surgical History  Procedure Laterality Date  . Mirena      Inserted 01-24-13  :  Current outpatient prescriptions:Ferrous Fumarate-Folic Acid (HEMOCYTE-F) 324-1 MG TABS, Take 1 daily, Disp: 90 each, Rfl: 4:  No Known Allergies:  Family History  Problem  Relation Age of Onset  . Hypertension Paternal Grandmother   . Sudden death Neg Hx   . Hyperlipidemia Neg Hx   . Heart attack Neg Hx   . Diabetes Neg Hx   : No history of any blood disorders or thalassemia.   History   Social History  . Marital Status: Married    Spouse Name: N/A    Number of Children: N/A  . Years of Education: N/A   Occupational History  . Not on file.   Social History Main Topics  . Smoking status: Never Smoker   . Smokeless tobacco: Never Used  . Alcohol Use: No  . Drug Use: No  . Sexual Activity: Yes    Partners: Male    Birth Control/ Protection: Condom     Comment: Mirena inserted 01-24-13 removed 2014   Other Topics Concern  . Not on file   Social History Narrative  . No narrative on file  :  Pertinent items are noted in HPI.  Exam: ECOG 0 Blood pressure 103/43, pulse 69, temperature 98.6 F (37 C), temperature source Oral, resp. rate 19, height 5' 4.75" (1.645 m), weight 242 lb (109.77 kg). General appearance: alert and cooperative Head: Normocephalic, without obvious abnormality Neck: no adenopathy Back: symmetric, no curvature. ROM normal. No CVA tenderness. Resp: clear to auscultation bilaterally Chest wall: no tenderness Cardio: regular rate and rhythm, S1, S2 normal, no murmur, click, rub or gallop GI: soft, non-tender; bowel sounds normal; no masses,  no organomegaly Extremities: extremities normal, atraumatic, no cyanosis or edema Pulses: 2+ and symmetric Skin: Skin color, texture,  turgor normal. No rashes or lesions Lymph nodes: Cervical, supraclavicular, and axillary nodes normal.   Recent Labs  08/22/14 1353  WBC 7.9  HGB 7.3*  HCT 25.2*  PLT 400    Recent Labs  08/22/14 1353  NA 141  K 4.1  CO2 25  GLUCOSE 82  BUN 10.6  CREATININE 1.0  CALCIUM 9.0       Assessment and Plan:    38 year old woman with the following issues:  1. Microcytic anemia presented with a hemoglobin of 7.3 and an MCV of 71.6.  Differential diagnosis was discussed with the patient today extensively. Most likely she has iron deficiency related to menorrhagia. This is related to her uterine fibroids at this time. She did also have a hemoglobinopathy such as sickle cell or thalassemia. She did have a completely normal hemoglobin on December 2010 with a perfectly normal MCV which goes against having a hemoglobinopathy.  Options of treatment were discussed today these including continuing oral iron as she is doing right now. I believe this will improve her iron stores over time but might take up to 3 months for full replacement of her iron stores. Alternatively, she may benefit from IV iron in the form of Feraheme. Risks and benefits of this medication was discussed. Complications include infusion related toxicities, arthralgias, myalgias and rarely anaphylaxis was discussed and the benefit would be rapid improvement in her iron stores, hemoglobin level and resolution of her symptoms.  For the time being she will continue oral iron and she will consider IV iron for the time being. If she agrees to proceed with IV iron, she will contact us directly and will set that up for her in the near future.  2. Menorrhagia related to uterine fibroids: I explained to her the reason she has developed iron deficiency is related to that. Unless this is fixed permanently, she will continue to have recurrent iron deficiency and she will require continuous iron supplements. She is following up with her OB/GYN regarding this issue to address different ways to permanently resolve this issue. She understands that she might end up needing a hysterectomy as a definitive treatment modality.  3. Followup: Will be in 3 months to recheck her iron stores in any case.

## 2014-08-22 NOTE — Progress Notes (Signed)
Checked in new pt with no financial concerns.  Pt is here for a hematology concern so no financial assistance is available at this time.  I gave pt my card for any questions or concerns.

## 2014-08-23 ENCOUNTER — Telehealth: Payer: Self-pay | Admitting: Medical Oncology

## 2014-08-23 ENCOUNTER — Telehealth: Payer: Self-pay | Admitting: Oncology

## 2014-08-23 ENCOUNTER — Other Ambulatory Visit: Payer: Self-pay | Admitting: Oncology

## 2014-08-23 DIAGNOSIS — D509 Iron deficiency anemia, unspecified: Secondary | ICD-10-CM | POA: Insufficient documentation

## 2014-08-23 NOTE — Telephone Encounter (Signed)
VMOM: Patient had appt with MD 09/10 and is has decided to have IV iron and asking to set appt for this.  Mssg routed to MD for orders.

## 2014-08-23 NOTE — Telephone Encounter (Signed)
Ordered POF sent.

## 2014-08-23 NOTE — Telephone Encounter (Signed)
s.w. pt and advised on Sept appt....ok adn aware

## 2014-08-28 ENCOUNTER — Ambulatory Visit (HOSPITAL_BASED_OUTPATIENT_CLINIC_OR_DEPARTMENT_OTHER): Payer: Managed Care, Other (non HMO)

## 2014-08-28 VITALS — BP 95/57 | HR 69 | Temp 98.5°F | Resp 20

## 2014-08-28 DIAGNOSIS — D509 Iron deficiency anemia, unspecified: Secondary | ICD-10-CM

## 2014-08-28 MED ORDER — SODIUM CHLORIDE 0.9 % IV SOLN
1020.0000 mg | Freq: Once | INTRAVENOUS | Status: AC
  Administered 2014-08-28: 1020 mg via INTRAVENOUS
  Filled 2014-08-28: qty 34

## 2014-08-28 MED ORDER — SODIUM CHLORIDE 0.9 % IV SOLN
Freq: Once | INTRAVENOUS | Status: AC
  Administered 2014-08-28: 09:00:00 via INTRAVENOUS

## 2014-08-28 NOTE — Patient Instructions (Signed)

## 2014-08-28 NOTE — Progress Notes (Signed)
Late entry: 1000: Pt tolerated first time feraheme infusion without any difficulty.

## 2014-10-14 ENCOUNTER — Encounter: Payer: Self-pay | Admitting: Oncology

## 2014-11-12 ENCOUNTER — Ambulatory Visit (INDEPENDENT_AMBULATORY_CARE_PROVIDER_SITE_OTHER): Payer: Managed Care, Other (non HMO) | Admitting: Women's Health

## 2014-11-12 ENCOUNTER — Encounter: Payer: Self-pay | Admitting: Women's Health

## 2014-11-12 VITALS — BP 128/80 | Ht 63.0 in | Wt 237.0 lb

## 2014-11-12 DIAGNOSIS — N920 Excessive and frequent menstruation with regular cycle: Secondary | ICD-10-CM

## 2014-11-12 DIAGNOSIS — N898 Other specified noninflammatory disorders of vagina: Secondary | ICD-10-CM

## 2014-11-12 DIAGNOSIS — N946 Dysmenorrhea, unspecified: Secondary | ICD-10-CM

## 2014-11-12 DIAGNOSIS — D259 Leiomyoma of uterus, unspecified: Secondary | ICD-10-CM

## 2014-11-12 LAB — WET PREP FOR TRICH, YEAST, CLUE
Clue Cells Wet Prep HPF POC: NONE SEEN
Trich, Wet Prep: NONE SEEN

## 2014-11-12 MED ORDER — IBUPROFEN 600 MG PO TABS: 600.0000 mg | ORAL_TABLET | Freq: Three times a day (TID) | ORAL | Status: DC | PRN

## 2014-11-12 NOTE — Patient Instructions (Signed)
Hysterectomy Information  A hysterectomy is a surgery in which your uterus is removed. This surgery may be done to treat various medical problems. After the surgery, you will no longer have menstrual periods. The surgery will also make you unable to become pregnant (sterile). The fallopian tubes and ovaries can be removed (bilateral salpingo-oophorectomy) during this surgery as well.  REASONS FOR A HYSTERECTOMY  Persistent, abnormal bleeding.  Lasting (chronic) pelvic pain or infection.  The lining of the uterus (endometrium) starts growing outside the uterus (endometriosis).  The endometrium starts growing in the muscle of the uterus (adenomyosis).  The uterus falls down into the vagina (pelvic organ prolapse).  Noncancerous growths in the uterus (uterine fibroids) that cause symptoms.  Precancerous cells.  Cervical cancer or uterine cancer. TYPES OF HYSTERECTOMIES  Supracervical hysterectomy--In this type, the top part of the uterus is removed, but not the cervix.  Total hysterectomy--The uterus and cervix are removed.  Radical hysterectomy--The uterus, the cervix, and the fibrous tissue that holds the uterus in place in the pelvis (parametrium) are removed. WAYS A HYSTERECTOMY CAN BE PERFORMED  Abdominal hysterectomy--A large surgical cut (incision) is made in the abdomen. The uterus is removed through this incision.  Vaginal hysterectomy--An incision is made in the vagina. The uterus is removed through this incision. There are no abdominal incisions.  Conventional laparoscopic hysterectomy--Three or four small incisions are made in the abdomen. A thin, lighted tube with a camera (laparoscope) is inserted into one of the incisions. Other tools are put through the other incisions. The uterus is cut into small pieces. The small pieces are removed through the incisions, or they are removed through the vagina.  Laparoscopically assisted vaginal hysterectomy (LAVH)--Three or four  small incisions are made in the abdomen. Part of the surgery is performed laparoscopically and part vaginally. The uterus is removed through the vagina.  Robot-assisted laparoscopic hysterectomy--A laparoscope and other tools are inserted into 3 or 4 small incisions in the abdomen. A computer-controlled device is used to give the surgeon a 3D image and to help control the surgical instruments. This allows for more precise movements of surgical instruments. The uterus is cut into small pieces and removed through the incisions or removed through the vagina. RISKS AND COMPLICATIONS  Possible complications associated with this procedure include:  Bleeding and risk of blood transfusion. Tell your health care provider if you do not want to receive any blood products.  Blood clots in the legs or lung.  Infection.  Injury to surrounding organs.  Problems or side effects related to anesthesia.  Conversion to an abdominal hysterectomy from one of the other techniques. WHAT TO EXPECT AFTER A HYSTERECTOMY  You will be given pain medicine.  You will need to have someone with you for the first 3-5 days after you go home.  You will need to follow up with your surgeon in 2-4 weeks after surgery to evaluate your progress.  You may have early menopause symptoms such as hot flashes, night sweats, and insomnia.  If you had a hysterectomy for a problem that was not cancer or not a condition that could lead to cancer, then you no longer need Pap tests. However, even if you no longer need a Pap test, a regular exam is a good idea to make sure no other problems are starting. Document Released: 05/25/2001 Document Revised: 09/19/2013 Document Reviewed: 08/06/2013 Exit[employer] Patient Information 2015 [employer]. This information is not intended to replace advice given to you by your health care   provider. Make sure you discuss any questions you have with your health care provider.

## 2014-11-12 NOTE — Addendum Note (Signed)
Addended by: Huel Cote on: 11/12/2014 11:08 AM   Modules accepted: Level of Service

## 2014-11-12 NOTE — Progress Notes (Signed)
Patient ID: Kathy Werner, female   DOB: 12-10-1976, 38 y.o.   MRN: [id number] Presents with complaint of heavy, long cycles bleeding through more than 1 pad an hour with severe cramping. Had to leave work due to pressure/cramping/back pain. Cycles monthly for usually 4-5 days, past cycle lasted almost 2 weeks. History of anemia, (H/H 7.3/25.2) had an iron infusion October 2015. History of multiple fibroids largest being greater than 5 cm. Contraceptives with condoms. Twins are 34 both are doing well, delivered vaginally. Had an IUD in 2014 removed shortly after painful.  Exam: Appears well, obese. External genitalia +1 cystocele, speculum exam scant clear discharge no erythema noted. Wet prep positive for few yeast. Bimanual uterus enlarged 16 week size.  Menorrhagia Fibroid uterus Yeast  Plan: Repeat ultrasound, will schedule, Diflucan 150 by mouth 1 dose, options reviewed, ablation, reviewed may not be as successful due to fibroids. Hysterectomy, will schedule ultrasound with Dr. Phineas Real and discuss possible surgical intervention. Keep scheduled follow-up with hematologist. Motrin 600 every 8 hours as needed for pain, prescription, proper use given and reviewed.

## 2014-11-26 ENCOUNTER — Ambulatory Visit: Payer: Managed Care, Other (non HMO) | Admitting: Oncology

## 2014-11-26 ENCOUNTER — Other Ambulatory Visit: Payer: Managed Care, Other (non HMO)

## 2014-11-27 ENCOUNTER — Encounter: Payer: Managed Care, Other (non HMO) | Admitting: Women's Health

## 2014-12-16 ENCOUNTER — Other Ambulatory Visit: Payer: Self-pay | Admitting: Gynecology

## 2014-12-16 DIAGNOSIS — D259 Leiomyoma of uterus, unspecified: Secondary | ICD-10-CM

## 2014-12-18 ENCOUNTER — Other Ambulatory Visit: Payer: Self-pay | Admitting: Gynecology

## 2014-12-18 ENCOUNTER — Ambulatory Visit (INDEPENDENT_AMBULATORY_CARE_PROVIDER_SITE_OTHER): Payer: Managed Care, Other (non HMO) | Admitting: Gynecology

## 2014-12-18 ENCOUNTER — Ambulatory Visit (INDEPENDENT_AMBULATORY_CARE_PROVIDER_SITE_OTHER): Payer: Managed Care, Other (non HMO)

## 2014-12-18 ENCOUNTER — Encounter: Payer: Self-pay | Admitting: Gynecology

## 2014-12-18 DIAGNOSIS — N852 Hypertrophy of uterus: Secondary | ICD-10-CM

## 2014-12-18 DIAGNOSIS — D251 Intramural leiomyoma of uterus: Secondary | ICD-10-CM

## 2014-12-18 DIAGNOSIS — N920 Excessive and frequent menstruation with regular cycle: Secondary | ICD-10-CM

## 2014-12-18 DIAGNOSIS — D252 Subserosal leiomyoma of uterus: Secondary | ICD-10-CM

## 2014-12-18 DIAGNOSIS — D259 Leiomyoma of uterus, unspecified: Secondary | ICD-10-CM

## 2014-12-18 LAB — CBC WITH DIFFERENTIAL/PLATELET
Basophils Absolute: 0 10*3/uL (ref 0.0–0.1)
Basophils Relative: 0 % (ref 0–1)
EOS ABS: 0.1 10*3/uL (ref 0.0–0.7)
Eosinophils Relative: 1 % (ref 0–5)
HEMATOCRIT: 27.9 % — AB (ref 36.0–46.0)
HEMOGLOBIN: 8.9 g/dL — AB (ref 12.0–15.0)
LYMPHS ABS: 2.5 10*3/uL (ref 0.7–4.0)
Lymphocytes Relative: 39 % (ref 12–46)
MCH: 26.1 pg (ref 26.0–34.0)
MCHC: 31.9 g/dL (ref 30.0–36.0)
MCV: 81.8 fL (ref 78.0–100.0)
MPV: 10.1 fL (ref 8.6–12.4)
Monocytes Absolute: 0.6 10*3/uL (ref 0.1–1.0)
Monocytes Relative: 9 % (ref 3–12)
NEUTROS PCT: 51 % (ref 43–77)
Neutro Abs: 3.2 10*3/uL (ref 1.7–7.7)
PLATELETS: 274 10*3/uL (ref 150–400)
RBC: 3.41 MIL/uL — ABNORMAL LOW (ref 3.87–5.11)
RDW: 14.8 % (ref 11.5–15.5)
WBC: 6.3 10*3/uL (ref 4.0–10.5)

## 2014-12-18 NOTE — Patient Instructions (Signed)
Follow up for sonohysterogram as scheduled.

## 2014-12-18 NOTE — Progress Notes (Signed)
Kathy Werner 1976-10-23 [id number]        39 y.o.  [id number] follows up for ultrasound was scheduled by Izora Gala due to menorrhagia and leiomyoma. Received IV iron through hematology due to significant anemia with hemoglobin in the 7 range. Reports being told that her last hemoglobin check was normal but I do not have a record of this. Having regular heavy menses. Trial of Mirena IUD unsuccessful being removed last year due to worsening dysmenorrhea.  Past medical history,surgical history, problem list, medications, allergies, family history and social history were all reviewed and documented in the EPIC chart.  Directed ROS with pertinent positives and negatives documented in the history of present illness/assessment and plan.  Ultrasound shows uterus enlarged with multiple myomas the largest measuring 66 mm. Endometrial echo 6.1 mm displaced by the leiomyoma. Right and left ovaries normal. Cul-de-sac negative.  Assessment/Plan:  39 y.o. [id number] with menorrhagia, iron deficiency anemia and multiple myomas. Patient states that she is not interested in future childbearing. Using condom contraception at this point. Has no aversion to transfusion. Reviewed management options to include observation, hormonal suppression such as oral contraceptives, retrial of Mirena IUD, endometrial ablation, myomectomy, hysterectomy. Feel endometrial assessment warranted to assess for submucous myomas. If present possible hysteroscopic resection to lighten her bleeding also reviewed. We'll start with sonohysterogram for better cavity assessment. She will think about all the other options above. As she is not interested in future childbearing I think hysterectomy would probably be her best choice as far as long term relief of her bleeding given the number of myomas that she has.  Possible vaginal/LAVH depending on exam and size of the uterus. I did not examine her today as I discussed my preference if we are moving toward surgery  to suppress her with Depo-Lupron for 3 months to maximize hemoglobin recovery and possibly shrink her uterus to allow for vaginal delivery. She does have a history of delivering twins vaginally in the past. Patient will follow up for the sonohysterogram and her ultimate decision as far as management. Will go ahead and check a baseline CBC today.     Anastasio Auerbach MD, 12:28 PM 12/18/2014

## 2015-01-06 ENCOUNTER — Telehealth: Payer: Self-pay | Admitting: Gynecology

## 2015-01-06 NOTE — Telephone Encounter (Signed)
01/06/15-I LM VM for pt that her [employer] ins will cover the sonohystogram with a $15.00 copay.wl

## 2015-01-08 ENCOUNTER — Ambulatory Visit: Payer: Managed Care, Other (non HMO) | Admitting: Gynecology

## 2015-01-08 ENCOUNTER — Other Ambulatory Visit: Payer: Managed Care, Other (non HMO)

## 2015-02-07 ENCOUNTER — Encounter (HOSPITAL_COMMUNITY): Payer: Self-pay

## 2015-02-12 ENCOUNTER — Other Ambulatory Visit: Payer: Self-pay | Admitting: Obstetrics and Gynecology

## 2015-02-16 ENCOUNTER — Encounter (HOSPITAL_COMMUNITY): Payer: Self-pay | Admitting: Obstetrics and Gynecology

## 2015-02-16 DIAGNOSIS — N920 Excessive and frequent menstruation with regular cycle: Secondary | ICD-10-CM | POA: Diagnosis present

## 2015-02-16 NOTE — H&P (Signed)
Kathy Werner is an 39 y.o. female. Who presents for management of menorrhagia with fibroids, one of which appreas to be almost completely intracavitary.  She has significant anemia and has had no improvement in menorrhagia withwith birth control pills or Mirena IUD.  She wants to avoid hysterectomy and would prefer not to use Lupron.  Pertinent Gynecological History: Menses: flow is excessive with use of 24 pads or tampons on heaviest days Bleeding: heavy for 7 days Contraception: condoms DES exposure: unknown Blood transfusions: none Sexually transmitted diseases: no past history Previous GYN Procedures: Mirena IUD placed and removed a few mos later  Last mammogram: n/a  Last pap: normal Date: 2014 OB History: G3, P2   Menstrual History: Menarche age: 57 Patient's last menstrual period was 01/29/2015 (exact date).    Past Medical History  Diagnosis Date  . Migraines   . Fibroid   . Menorrhagia 03/2014  . Anemia     Past Surgical History  Procedure Laterality Date  . Mirena      Inserted 01-24-13  . Iud removal  06/2013    due to worsening dysmenorrhea  . Wisdom tooth extraction      Family History  Problem Relation Age of Onset  . Hypertension Paternal Grandmother   . Sudden death Neg Hx   . Hyperlipidemia Neg Hx   . Heart attack Neg Hx   . Diabetes Neg Hx     Social History:  reports that she has never smoked. She has never used smokeless tobacco. She reports that she does not drink alcohol or use illicit drugs.  Allergies: No Known Allergies  Prescriptions prior to admission  Medication Sig Dispense Refill Last Dose  . acetaminophen (TYLENOL) 325 MG tablet Take 650 mg by mouth every 6 (six) hours as needed for moderate pain or headache.   Past Month at Unknown time  . ibuprofen (ADVIL,MOTRIN) 600 MG tablet Take 1 tablet (600 mg total) by mouth every 8 (eight) hours as needed. 60 tablet 1 Past Month at Unknown time  . oseltamivir (TAMIFLU) 30 MG capsule Take 30  mg by mouth.   Past Month at Unknown time    Review of Systems  Constitutional: Negative.   HENT: Negative.   Eyes: Negative.   Respiratory: Negative.        Had influenza about 3 weeks ago treated with Tamiflu and no sx for abouut 2 weeks now.  Cardiovascular: Negative.   Gastrointestinal: Positive for abdominal pain.       Related to menses  Genitourinary: Negative.   Musculoskeletal: Negative.   Skin: Negative.   Neurological: Negative.   Endo/Heme/Allergies: Negative.   Psychiatric/Behavioral: Negative.     Blood pressure 110/54, pulse 85, temperature 97.5 F (36.4 C), temperature source Oral, resp. rate 18, height 5\' 3"  (1.6 m), weight 236 lb (107.049 kg), last menstrual period 01/29/2015, SpO2 99 %. Physical Exam  Constitutional: She is oriented to person, place, and time.  Morbidly obese with BMI=41  HENT:  Head: Normocephalic and atraumatic.  Eyes: EOM are normal.  Neck: Normal range of motion. Neck supple.  Cardiovascular: Normal rate and regular rhythm.   Respiratory: Effort normal and breath sounds normal.  GI: Soft. Bowel sounds are normal. She exhibits mass.  Suprapubic mass to umbilicus  Genitourinary:  Pelvic exam:  VULVA: normal appearing vulva with no masses, tenderness or lesions,  VAGINA: normal appearing vagina with normal color and discharge, no lesions,  CERVIX: normal appearing cervix without discharge or lesions,  UTERUS:  enlarged to 20 week's size, mobile, ADNEXA: no masses, exam compromised by enlarged uterus,  RECTAL: normal rectal, no masses.  Musculoskeletal: Normal range of motion.  Neurological: She is alert and oriented to person, place, and time.  Skin: Skin is warm and dry.  Psychiatric: She has a normal mood and affect.   Office sonohysterogram:  Numerous intramural fibroids.  Probable intracavitary fibroid measuring 4 cm  Results for orders placed or performed during the hospital encounter of 02/17/15 (from the past 24 hour(s))   Pregnancy, urine     Status: None   Collection Time: 02/17/15  7:30 AM  Result Value Ref Range   Preg Test, Ur NEGATIVE NEGATIVE  CBC     Status: Abnormal   Collection Time: 02/17/15  7:33 AM  Result Value Ref Range   WBC 6.2 4.0 - 10.5 K/uL   RBC 3.52 (L) 3.87 - 5.11 MIL/uL   Hemoglobin 7.7 (L) 12.0 - 15.0 g/dL   HCT 26.2 (L) 36.0 - 46.0 %   MCV 74.4 (L) 78.0 - 100.0 fL   MCH 21.9 (L) 26.0 - 34.0 pg   MCHC 29.4 (L) 30.0 - 36.0 g/dL   RDW 15.4 11.5 - 15.5 %   Platelets 274 150 - 400 K/uL    Assessment/Plan: Options for management of menorrhagia in the setting of large fibroids were reviewed.  Hysteroscopy with resection of intra-cavity fibroid was recommended and accepted.  The risks of anesthesia, bleeding, infection, and damage to adjacent organs were reviewed.  The specific risk of uterine perforation was reviewed.  I also reviewed the need to discontinue the procedure immediately should perforation occur.  Perforation in rare cases may require hysterectomy to control bleeding. The large size of the intracavitary fibroid also makes the risk of fluid overload a concern.  The patient understands that should the safe limit of fluid deficit be reached prior to complete resection , that the procedure would be discontinued at that point, and that she still may benefit from the procedure even in the absence of complete resection of the intracavitary lesion.  Kathy Werner P 02/17/2015, 8:59 AM

## 2015-02-17 ENCOUNTER — Encounter (HOSPITAL_COMMUNITY)
Admission: RE | Disposition: A | Discharge status: Home / Self Care | Payer: Self-pay | Source: Ambulatory Visit | Attending: Obstetrics and Gynecology

## 2015-02-17 ENCOUNTER — Ambulatory Visit (HOSPITAL_COMMUNITY): Payer: Managed Care, Other (non HMO) | Admitting: Anesthesiology

## 2015-02-17 ENCOUNTER — Encounter (HOSPITAL_COMMUNITY): Payer: Self-pay | Admitting: Anesthesiology

## 2015-02-17 ENCOUNTER — Ambulatory Visit (HOSPITAL_COMMUNITY)
Admission: RE | Admit: 2015-02-17 | Discharge: 2015-02-17 | Disposition: A | Discharge status: Home / Self Care | Payer: Managed Care, Other (non HMO) | Source: Ambulatory Visit | Attending: Obstetrics and Gynecology | Admitting: Obstetrics and Gynecology

## 2015-02-17 DIAGNOSIS — D259 Leiomyoma of uterus, unspecified: Secondary | ICD-10-CM | POA: Diagnosis present

## 2015-02-17 DIAGNOSIS — N946 Dysmenorrhea, unspecified: Secondary | ICD-10-CM

## 2015-02-17 DIAGNOSIS — Z79899 Other long term (current) drug therapy: Secondary | ICD-10-CM | POA: Diagnosis not present

## 2015-02-17 DIAGNOSIS — Z6841 Body Mass Index (BMI) 40.0 and over, adult: Secondary | ICD-10-CM | POA: Diagnosis not present

## 2015-02-17 DIAGNOSIS — Z862 Personal history of diseases of the blood and blood-forming organs and certain disorders involving the immune mechanism: Secondary | ICD-10-CM | POA: Insufficient documentation

## 2015-02-17 DIAGNOSIS — N920 Excessive and frequent menstruation with regular cycle: Secondary | ICD-10-CM | POA: Diagnosis present

## 2015-02-17 DIAGNOSIS — Z791 Long term (current) use of non-steroidal anti-inflammatories (NSAID): Secondary | ICD-10-CM | POA: Diagnosis not present

## 2015-02-17 DIAGNOSIS — G43909 Migraine, unspecified, not intractable, without status migrainosus: Secondary | ICD-10-CM | POA: Diagnosis not present

## 2015-02-17 DIAGNOSIS — D250 Submucous leiomyoma of uterus: Secondary | ICD-10-CM | POA: Diagnosis not present

## 2015-02-17 DIAGNOSIS — D509 Iron deficiency anemia, unspecified: Secondary | ICD-10-CM | POA: Diagnosis present

## 2015-02-17 HISTORY — DX: Anemia, unspecified: D64.9

## 2015-02-17 HISTORY — PX: DILATATION & CURETTAGE/HYSTEROSCOPY WITH MYOSURE: SHX6511

## 2015-02-17 LAB — COMPREHENSIVE METABOLIC PANEL
ALT: 13 U/L (ref 0–35)
AST: 14 U/L (ref 0–37)
Albumin: 3 g/dL — ABNORMAL LOW (ref 3.5–5.2)
Alkaline Phosphatase: 54 U/L (ref 39–117)
Anion gap: 6 (ref 5–15)
BUN: 12 mg/dL (ref 6–23)
CHLORIDE: 110 mmol/L (ref 96–112)
CO2: 23 mmol/L (ref 19–32)
Calcium: 8 mg/dL — ABNORMAL LOW (ref 8.4–10.5)
Creatinine, Ser: 0.85 mg/dL (ref 0.50–1.10)
GFR, EST NON AFRICAN AMERICAN: 87 mL/min — AB (ref >90)
Glucose, Bld: 100 mg/dL — ABNORMAL HIGH (ref 70–99)
POTASSIUM: 4.2 mmol/L (ref 3.5–5.1)
SODIUM: 139 mmol/L (ref 135–145)
Total Bilirubin: 0.5 mg/dL (ref 0.3–1.2)
Total Protein: 5.8 g/dL — ABNORMAL LOW (ref 6.0–8.3)

## 2015-02-17 LAB — CBC
HCT: 26.2 % — ABNORMAL LOW (ref 36.0–46.0)
Hemoglobin: 7.7 g/dL — ABNORMAL LOW (ref 12.0–15.0)
MCH: 21.9 pg — AB (ref 26.0–34.0)
MCHC: 29.4 g/dL — AB (ref 30.0–36.0)
MCV: 74.4 fL — ABNORMAL LOW (ref 78.0–100.0)
Platelets: 274 10*3/uL (ref 150–400)
RBC: 3.52 MIL/uL — ABNORMAL LOW (ref 3.87–5.11)
RDW: 15.4 % (ref 11.5–15.5)
WBC: 6.2 10*3/uL (ref 4.0–10.5)

## 2015-02-17 LAB — TYPE AND SCREEN
ABO/RH(D): AB POS
Antibody Screen: NEGATIVE

## 2015-02-17 LAB — ABO/RH: ABO/RH(D): AB POS

## 2015-02-17 LAB — PREGNANCY, URINE: Preg Test, Ur: NEGATIVE

## 2015-02-17 SURGERY — DILATATION & CURETTAGE/HYSTEROSCOPY WITH MYOSURE
Anesthesia: General | Site: Uterus

## 2015-02-17 MED ORDER — FENTANYL CITRATE 0.05 MG/ML IJ SOLN
25.0000 ug | INTRAMUSCULAR | Status: DC | PRN
  Administered 2015-02-17: 50 ug via INTRAVENOUS

## 2015-02-17 MED ORDER — ACETAMINOPHEN 160 MG/5ML PO SOLN: 325.0000 mg | ORAL | Status: DC | PRN

## 2015-02-17 MED ORDER — SCOPOLAMINE 1 MG/3DAYS TD PT72
MEDICATED_PATCH | TRANSDERMAL | Status: AC
  Administered 2015-02-17: 1.5 mg via TRANSDERMAL
  Filled 2015-02-17: qty 1

## 2015-02-17 MED ORDER — FENTANYL CITRATE 0.05 MG/ML IJ SOLN
INTRAMUSCULAR | Status: AC
  Filled 2015-02-17: qty 2

## 2015-02-17 MED ORDER — LIDOCAINE HCL 2 % IJ SOLN
INTRAMUSCULAR | Status: AC
  Filled 2015-02-17: qty 20

## 2015-02-17 MED ORDER — ONDANSETRON HCL 4 MG/2ML IJ SOLN
INTRAMUSCULAR | Status: DC | PRN
  Administered 2015-02-17: 4 mg via INTRAVENOUS

## 2015-02-17 MED ORDER — DEXAMETHASONE SODIUM PHOSPHATE 10 MG/ML IJ SOLN
INTRAMUSCULAR | Status: DC | PRN
  Administered 2015-02-17: 4 mg via INTRAVENOUS

## 2015-02-17 MED ORDER — LIDOCAINE HCL 2 % IJ SOLN
INTRAMUSCULAR | Status: DC | PRN
  Administered 2015-02-17: 10 mL

## 2015-02-17 MED ORDER — MIDAZOLAM HCL 2 MG/2ML IJ SOLN: 0.5000 mg | Freq: Once | INTRAMUSCULAR | Status: DC | PRN

## 2015-02-17 MED ORDER — ACETAMINOPHEN 325 MG PO TABS: 325.0000 mg | ORAL_TABLET | ORAL | Status: DC | PRN

## 2015-02-17 MED ORDER — KETOROLAC TROMETHAMINE 30 MG/ML IJ SOLN
INTRAMUSCULAR | Status: AC
  Filled 2015-02-17: qty 1

## 2015-02-17 MED ORDER — FENTANYL CITRATE 0.05 MG/ML IJ SOLN
INTRAMUSCULAR | Status: DC | PRN
  Administered 2015-02-17 (×2): 50 ug via INTRAVENOUS
  Administered 2015-02-17 (×3): 25 ug via INTRAVENOUS

## 2015-02-17 MED ORDER — IBUPROFEN 600 MG PO TABS: ORAL_TABLET | ORAL | Status: DC

## 2015-02-17 MED ORDER — CEFAZOLIN SODIUM-DEXTROSE 2-3 GM-% IV SOLR
INTRAVENOUS | Status: AC
  Filled 2015-02-17: qty 50

## 2015-02-17 MED ORDER — PROMETHAZINE HCL 25 MG/ML IJ SOLN: 6.2500 mg | INTRAMUSCULAR | Status: DC | PRN

## 2015-02-17 MED ORDER — PROPOFOL 10 MG/ML IV BOLUS
INTRAVENOUS | Status: DC | PRN
  Administered 2015-02-17: 200 mg via INTRAVENOUS

## 2015-02-17 MED ORDER — LIDOCAINE HCL (CARDIAC) 20 MG/ML IV SOLN
INTRAVENOUS | Status: DC | PRN
  Administered 2015-02-17: 80 mg via INTRAVENOUS

## 2015-02-17 MED ORDER — MIDAZOLAM HCL 2 MG/2ML IJ SOLN
INTRAMUSCULAR | Status: AC
  Filled 2015-02-17: qty 2

## 2015-02-17 MED ORDER — KETOROLAC TROMETHAMINE 30 MG/ML IJ SOLN
INTRAMUSCULAR | Status: DC | PRN
  Administered 2015-02-17: 30 mg via INTRAMUSCULAR
  Administered 2015-02-17: 30 mg via INTRAVENOUS

## 2015-02-17 MED ORDER — CEFAZOLIN SODIUM-DEXTROSE 2-3 GM-% IV SOLR
INTRAVENOUS | Status: DC | PRN
  Administered 2015-02-17: 2 g via INTRAVENOUS

## 2015-02-17 MED ORDER — SODIUM CHLORIDE 0.9 % IR SOLN
Status: DC | PRN
  Administered 2015-02-17 (×2): 3000 mL

## 2015-02-17 MED ORDER — ONDANSETRON HCL 4 MG/2ML IJ SOLN
INTRAMUSCULAR | Status: AC
  Filled 2015-02-17: qty 2

## 2015-02-17 MED ORDER — PROPOFOL 10 MG/ML IV BOLUS
INTRAVENOUS | Status: AC
  Filled 2015-02-17: qty 20

## 2015-02-17 MED ORDER — DEXAMETHASONE SODIUM PHOSPHATE 4 MG/ML IJ SOLN
INTRAMUSCULAR | Status: AC
  Filled 2015-02-17: qty 1

## 2015-02-17 MED ORDER — MEPERIDINE HCL 25 MG/ML IJ SOLN: 6.2500 mg | INTRAMUSCULAR | Status: DC | PRN

## 2015-02-17 MED ORDER — KETOROLAC TROMETHAMINE 30 MG/ML IJ SOLN: 30.0000 mg | Freq: Once | INTRAMUSCULAR | Status: DC | PRN

## 2015-02-17 MED ORDER — LACTATED RINGERS IV SOLN
INTRAVENOUS | Status: DC
  Administered 2015-02-17 (×2): via INTRAVENOUS

## 2015-02-17 MED ORDER — HYDROCODONE-ACETAMINOPHEN 5-325 MG PO TABS: 1.0000 | ORAL_TABLET | Freq: Four times a day (QID) | ORAL | Status: DC | PRN

## 2015-02-17 MED ORDER — SCOPOLAMINE 1 MG/3DAYS TD PT72
1.0000 | MEDICATED_PATCH | Freq: Once | TRANSDERMAL | Status: DC
  Administered 2015-02-17: 1.5 mg via TRANSDERMAL

## 2015-02-17 MED ORDER — MIDAZOLAM HCL 2 MG/2ML IJ SOLN
INTRAMUSCULAR | Status: DC | PRN
  Administered 2015-02-17: 2 mg via INTRAVENOUS

## 2015-02-17 SURGICAL SUPPLY — 24 items
BOOTIES KNEE HIGH SLOAN (MISCELLANEOUS) ×6 IMPLANT
CANISTER SUCT 3000ML (MISCELLANEOUS) ×6 IMPLANT
CATH ROBINSON RED A/P 16FR (CATHETERS) ×3 IMPLANT
CLOTH BEACON ORANGE TIMEOUT ST (SAFETY) ×3 IMPLANT
CONTAINER PREFILL 10% NBF 60ML (FORM) ×6 IMPLANT
DEVICE MYOSURE CLASSIC (MISCELLANEOUS) IMPLANT
DEVICE MYOSURE LITE (MISCELLANEOUS) IMPLANT
DILATOR CANAL MILEX (MISCELLANEOUS) IMPLANT
ELECT REM PT RETURN 9FT ADLT (ELECTROSURGICAL) ×3
ELECTRODE REM PT RTRN 9FT ADLT (ELECTROSURGICAL) ×1 IMPLANT
FILTER ARTHROSCOPY CONVERTOR (FILTER) IMPLANT
GLOVE BIOGEL PI IND STRL 7.0 (GLOVE) ×3 IMPLANT
GLOVE BIOGEL PI INDICATOR 7.0 (GLOVE) ×6
GLOVE ECLIPSE 7.0 STRL STRAW (GLOVE) ×3 IMPLANT
GLOVE SURG SS PI 6.5 STRL IVOR (GLOVE) ×6 IMPLANT
GOWN STRL REUS W/TWL LRG LVL3 (GOWN DISPOSABLE) ×6 IMPLANT
MYOSURE XL FIBROID REM (MISCELLANEOUS) ×3
PACK VAGINAL MINOR WOMEN LF (CUSTOM PROCEDURE TRAY) ×3 IMPLANT
PAD OB MATERNITY 4.3X12.25 (PERSONAL CARE ITEMS) ×3 IMPLANT
SEAL ROD LENS SCOPE MYOSURE (ABLATOR) ×3 IMPLANT
SYSTEM TISS REMOVAL MYSR XL RM (MISCELLANEOUS) ×1 IMPLANT
TOWEL OR 17X24 6PK STRL BLUE (TOWEL DISPOSABLE) ×6 IMPLANT
TUBING AQUILEX INFLOW (TUBING) ×3 IMPLANT
TUBING AQUILEX OUTFLOW (TUBING) ×3 IMPLANT

## 2015-02-17 NOTE — Anesthesia Procedure Notes (Signed)
Procedure Name: LMA Insertion Date/Time: 02/17/2015 9:19 AM Performed by: Raenette Rover Pre-anesthesia Checklist: Patient identified, Patient being monitored, Emergency Drugs available and Suction available Patient Re-evaluated:Patient Re-evaluated prior to inductionOxygen Delivery Method: Circle system utilized Preoxygenation: Pre-oxygenation with 100% oxygen Intubation Type: IV induction Ventilation: Mask ventilation without difficulty LMA: LMA inserted LMA Size: 4.0 Number of attempts: 1 Placement Confirmation: breath sounds checked- equal and bilateral,  positive ETCO2 and CO2 detector Tube secured with: Tape Dental Injury: Teeth and Oropharynx as per pre-operative assessment

## 2015-02-17 NOTE — Discharge Instructions (Signed)
Hysteroscopy Hysteroscopy is a procedure used for looking inside the womb (uterus). It may be done for various reasons, including:  To evaluate abnormal bleeding, fibroid (benign, noncancerous) tumors, polyps, scar tissue (adhesions), and possibly cancer of the uterus.  To look for lumps (tumors) and other uterine growths.  To look for causes of why a woman cannot get pregnant (infertility), causes of recurrent loss of pregnancy (miscarriages), or a lost intrauterine device (IUD).  To perform a sterilization by blocking the fallopian tubes from inside the uterus. In this procedure, a thin, flexible tube with a tiny light and camera on the end of it (hysteroscope) is used to look inside the uterus. A hysteroscopy should be done right after a menstrual period to be sure you are not pregnant. LET [facility] CARE PROVIDER KNOW ABOUT:   Any allergies you have.  All medicines you are taking, including vitamins, herbs, eye drops, creams, and over-the-counter medicines.  Previous problems you or members of your family have had with the use of anesthetics.  Any blood disorders you have.  Previous surgeries you have had.  Medical conditions you have. RISKS AND COMPLICATIONS  Generally, this is a safe procedure. However, as with any procedure, complications can occur. Possible complications include:  Putting a hole in the uterus.  Excessive bleeding.  Infection.  Damage to the cervix.  Injury to other organs.  Allergic reaction to medicines.  Too much fluid used in the uterus for the procedure. BEFORE THE PROCEDURE   Ask your health care provider about changing or stopping any regular medicines.  Do not take aspirin or blood thinners for 1 week before the procedure, or as directed by your health care provider. These can cause bleeding.  If you smoke, do not smoke for 2 weeks before the procedure.  In some cases, a medicine is placed in the cervix the day before the procedure.  This medicine makes the cervix have a larger opening (dilate). This makes it easier for the instrument to be inserted into the uterus during the procedure.  Do not eat or drink anything for at least 8 hours before the surgery.  Arrange for someone to take you home after the procedure. PROCEDURE   You may be given a medicine to relax you (sedative). You may also be given one of the following:  A medicine that numbs the area around the cervix (local anesthetic).  A medicine that makes you sleep through the procedure (general anesthetic).  The hysteroscope is inserted through the vagina into the uterus. The camera on the hysteroscope sends a picture to a TV screen. This gives the surgeon a good view inside the uterus.  During the procedure, air or a liquid is put into the uterus, which allows the surgeon to see better.  Sometimes, tissue is gently scraped from inside the uterus. These tissue samples are sent to a lab for testing. AFTER THE PROCEDURE   If you had a general anesthetic, you may be groggy for a couple hours after the procedure.  If you had a local anesthetic, you will be able to go home as soon as you are stable and feel ready.  You may have some cramping. This normally lasts for a couple days.  You may have bleeding, which varies from light spotting for a few days to menstrual-like bleeding for 3-7 days. This is normal.  Your post op appointment is on 03/05/15 at 315 pm    Hysteroscopy, Care After Refer to this sheet in the  next few weeks. These instructions provide you with information on caring for yourself after your procedure. Your health care provider may also give you more specific instructions. Your treatment has been planned according to current medical practices, but problems sometimes occur. Call your health care provider if you have any problems or questions after your procedure.  WHAT TO EXPECT AFTER THE PROCEDURE After your procedure, it is typical to have  the following:  You may have some cramping. This normally lasts for a couple days.  You may have bleeding. This can vary from light spotting for a few days to menstrual-like bleeding for 3-7 days. HOME CARE INSTRUCTIONS  Rest for the first 1-2 days after the procedure.  Only take over-the-counter or prescription medicines as directed by your health care provider. Do not take aspirin. It can increase the chances of bleeding.  Take showers instead of baths for 2 weeks or as directed by your health care provider.  Do not drive for 24 hours or as directed.  Do not drink alcohol while taking pain medicine.  Do not use tampons, douche, or have sexual intercourse for 2 weeks or until your health care provider says it is okay.  Take your temperature twice a day for 4-5 days. Write it down each time.  Follow your health care provider's advice about diet, exercise, and lifting.  If you develop constipation, you may:  Take a mild laxative if your health care provider approves.  Add bran foods to your diet.  Drink enough fluids to keep your urine clear or pale yellow.  Try to have someone with you or available to you for the first 24-48 hours, especially if you were given a general anesthetic.  Follow up with your health care provider as directed:  Your post op appointment is on 03/05/15 at 315 pm   Agency IF:  You feel dizzy or lightheaded.  You feel sick to your stomach (nauseous).  You have abnormal vaginal discharge.  You have a rash.  You have pain that is not controlled with medicine. SEEK IMMEDIATE MEDICAL CARE IF:  You have bleeding that is heavier than a normal menstrual period.  You have a fever.  You have increasing cramps or pain, not controlled with medicine.  You have new belly (abdominal) pain.  You pass out.  You have pain in the tops of your shoulders (shoulder strap areas).  You have shortness of breath.   Document Released: 09/19/2013  Document Reviewed: 09/19/2013 [facility] Patient Information 2015 [place], [place]. This information is not intended to replace advice given to you by your health care provider. Make sure you discuss any questions you have with your health care provider.  Document Released: 03/07/2001 Document Revised: 09/19/2013 Document Reviewed: 06/28/2013 [facility] Patient Information 2015 [place], [place]. This information is not intended to replace advice given to you by your health care provider. Make sure you discuss any questions you have with your health care provider.    DISCHARGE INSTRUCTIONS: D&C  The following instructions have been prepared to help you care for yourself upon your return home.  MAY TAKE IBUPROFEN (MOTRIN, ADVIL) OR ALEVE AFTER 4:00 PM FOR CRAMPS!! MAY TAKE OFF THE PATCH BEHIND YOUR EAR IN 48 HOURS!!   Personal hygiene:  Use sanitary pads for vaginal drainage, not tampons.  Shower the day after your procedure.  NO tub baths, pools or Jacuzzis for 2-3 weeks.  Wipe front to back after using the bathroom.  Activity and limitations:  Do NOT  drive or operate any equipment for 24 hours. The effects of anesthesia are still present and drowsiness may result.  Do NOT rest in bed all day.  Walking is encouraged.  Walk up and down stairs slowly.  You may resume your normal activity in one to two days or as indicated by your physician.  Sexual activity: NO intercourse for at least 2 weeks after the procedure, or as indicated by your physician.  Diet: Eat a light meal as desired this evening. You may resume your usual diet tomorrow.  Return to work: You may resume your work activities in one to two days or as indicated by your doctor.  What to expect after your surgery: Expect to have vaginal bleeding/discharge for 2-3 days and spotting for up to 10 days. It is not unusual to have soreness for up to 1-2 weeks. You may have a slight burning sensation when you urinate for the first  day. Mild cramps may continue for a couple of days. You may have a regular period in 2-6 weeks.  Call your doctor for any of the following:  Excessive vaginal bleeding, saturating and changing one pad every hour.  Inability to urinate 6 hours after discharge from hospital.  Pain not relieved by pain medication.  Fever of 100.4 F or greater.  Unusual vaginal discharge or odor.   Call for an appointment:    Patients signature: ______________________  Nurses signature ________________________  Support person's signature_______________________

## 2015-02-17 NOTE — Op Note (Signed)
02/17/2015  10:20 AM  PATIENT:  Kathy Werner  39 y.o. female  PRE-OPERATIVE DIAGNOSIS:  Uterine Fibroids  POST-OPERATIVE DIAGNOSIS:  Uterine Fibroids  PROCEDURE:  Procedure(s): DILATATION & CURETTAGE/HYSTEROSCOPY WITH MYOSURE  SURGEON:  Nizar Cutler P, MD  ASSISTANTS: none  ANESTHESIA:   general and paracervical block  ESTIMATED BLOOD LOSS: Less than 10cc  BLOOD ADMINISTERED:none  COMPLICATIONS:  none  FINDINGS: The uterus sounded to 11cm.  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 There was a subcentimeter fibroid originating from the left anterior uterine wall with minimal intracavitary position. The tubal ostia were visualized bilaterally.  FLUID DEFICIT: 2300 cc  LOCAL MEDICATIONS USED:  LIDOCAINE  and Amount: 10 ml  OTHER INTRAOPERATIVE MEDICATIONS:  Ketorolac 30 mg IV and 30 mg IM         Cefazolin 2 g IV  SPECIMEN:  Source of Specimen:  Uterine fibroid  DISPOSITION OF SPECIMEN:  PATHOLOGY  COUNTS:  YES  DESCRIPTION OF PROCEDURE:the patient was taken to the operating room after appropriate identification and placed on the operating table. After the attainment of adequate general anesthesia she was placed in the lithotomy position. The perineum and vagina were prepped with multiple layers of Betadine. The bladder was emptied with a an in and out catheter. The perineum was draped in sterile field. A gray speculum was placed in the vagina. The cervix was grasped with a single-tooth tenaculum. A paracervical block was achieved with a total of 10 cc of 2% Xylocaine and the 5 and 7:00 positions. The uterus was sounded.  The cervix was then dilated to accommodate the diagnostic hysteroscope. The hysteroscope was used to evaluate all quadrants of the uterus with the above-noted findings.. The minor sure apparatus was placed through the outflow tract and used to resect the larger of the fibroids down to the level that the cavity appeared  uniform. At this point, the deficit was 2300 cc and the procedure was completed. The patient was given ketorolac 30 mg IV and 30 mg IM All instruments were then removed from the vagina and the patient was awakened from general anesthesia and taken to the recovery room in satisfactory condition having tolerated the procedure well sponge and instrument counts correct.  PLAN OF CARE: Discharge home after postanesthesia care  PATIENT DISPOSITION:  PACU - hemodynamically stable.   Delay start of Pharmacological VTE agent (>24hrs) due to surgical blood loss or risk of bleeding:  Yes. SCD hose were used during the entire procedure. Marland Kitchen     Anyela Napierkowski P, MD 10:20 AM

## 2015-02-17 NOTE — Transfer of Care (Signed)
Immediate Anesthesia Transfer of Care Note  Patient: Kathy Werner  Procedure(s) Performed: Procedure(s): DILATATION & CURETTAGE/HYSTEROSCOPY WITH MYOSURE (N/A)  Patient Location: PACU  Anesthesia Type:General  Level of Consciousness: awake, alert , oriented and patient cooperative  Airway & Oxygen Therapy: Patient Spontanous Breathing and Patient connected to nasal cannula oxygen  Post-op Assessment: Report given to RN and Post -op Vital signs reviewed and stable  Post vital signs: Reviewed and stable  Last Vitals:  Filed Vitals:   02/17/15 0740  BP: 110/54  Pulse: 85  Temp: 36.4 C  Resp: 18    Complications: No apparent anesthesia complications

## 2015-02-17 NOTE — Anesthesia Preprocedure Evaluation (Signed)
Anesthesia Evaluation  Patient identified by MRN, date of birth, ID band Patient awake    Reviewed: Allergy & Precautions, H&P , Patient's Chart, lab work & pertinent test results, reviewed documented beta blocker date and time   History of Anesthesia Complications Negative for: history of anesthetic complications  Airway Mallampati: III  TM Distance: >3 FB Neck ROM: full    Dental   Pulmonary  breath sounds clear to auscultation        Cardiovascular Exercise Tolerance: Good Rhythm:regular Rate:Normal     Neuro/Psych  Headaches, negative psych ROS   GI/Hepatic   Endo/Other  Morbid obesity  Renal/GU      Musculoskeletal   Abdominal   Peds  Hematology  (+) anemia ,   Anesthesia Other Findings   Reproductive/Obstetrics                             Anesthesia Physical Anesthesia Plan  ASA: III  Anesthesia Plan: General LMA   Post-op Pain Management:    Induction:   Airway Management Planned:   Additional Equipment:   Intra-op Plan:   Post-operative Plan:   Informed Consent: I have reviewed the patients History and Physical, chart, labs and discussed the procedure including the risks, benefits and alternatives for the proposed anesthesia with the patient or authorized representative who has indicated his/her understanding and acceptance.   Dental Advisory Given  Plan Discussed with: CRNA, Surgeon and Anesthesiologist  Anesthesia Plan Comments:         Anesthesia Quick Evaluation

## 2015-02-17 NOTE — Anesthesia Postprocedure Evaluation (Signed)
  Anesthesia Post Note  Patient: Kathy Werner  Procedure(s) Performed: Procedure(s) (LRB): DILATATION & CURETTAGE/HYSTEROSCOPY WITH MYOSURE (N/A)  Anesthesia type: GA  Patient location: PACU  Post pain: Pain level controlled  Post assessment: Post-op Vital signs reviewed  Last Vitals:  Filed Vitals:   02/17/15 1045  BP: 118/70  Pulse: 83  Temp:   Resp: 23    Post vital signs: Reviewed  Level of consciousness: sedated  Complications: No apparent anesthesia complications

## 2015-02-18 ENCOUNTER — Encounter (HOSPITAL_COMMUNITY): Payer: Self-pay | Admitting: Obstetrics and Gynecology

## 2015-03-11 ENCOUNTER — Other Ambulatory Visit: Payer: Self-pay | Admitting: Oncology

## 2015-03-11 ENCOUNTER — Other Ambulatory Visit: Payer: Self-pay | Admitting: *Deleted

## 2015-03-11 ENCOUNTER — Telehealth: Payer: Self-pay | Admitting: *Deleted

## 2015-03-11 DIAGNOSIS — D509 Iron deficiency anemia, unspecified: Secondary | ICD-10-CM

## 2015-03-11 NOTE — Telephone Encounter (Signed)
Per staff message and POF I have scheduled appts. Advised scheduler of appts. JMW  

## 2015-03-12 ENCOUNTER — Telehealth: Payer: Self-pay | Admitting: Oncology

## 2015-03-12 NOTE — Telephone Encounter (Signed)
Lft msg for pt confirming labs/infusion iron per pt's request, advised of D/T and to call our office if she can't make this time.... KJ

## 2015-03-14 ENCOUNTER — Other Ambulatory Visit: Payer: Self-pay | Admitting: Medical Oncology

## 2015-03-14 ENCOUNTER — Ambulatory Visit (HOSPITAL_BASED_OUTPATIENT_CLINIC_OR_DEPARTMENT_OTHER): Payer: Managed Care, Other (non HMO)

## 2015-03-14 ENCOUNTER — Other Ambulatory Visit (HOSPITAL_BASED_OUTPATIENT_CLINIC_OR_DEPARTMENT_OTHER): Payer: Managed Care, Other (non HMO)

## 2015-03-14 ENCOUNTER — Other Ambulatory Visit: Payer: Self-pay | Admitting: Oncology

## 2015-03-14 DIAGNOSIS — D509 Iron deficiency anemia, unspecified: Secondary | ICD-10-CM

## 2015-03-14 LAB — FERRITIN CHCC: Ferritin: 7 ng/ml — ABNORMAL LOW (ref 9–269)

## 2015-03-14 LAB — CBC WITH DIFFERENTIAL/PLATELET
BASO%: 0.6 % (ref 0.0–2.0)
BASOS ABS: 0 10*3/uL (ref 0.0–0.1)
EOS%: 1.7 % (ref 0.0–7.0)
Eosinophils Absolute: 0.1 10*3/uL (ref 0.0–0.5)
HCT: 25.7 % — ABNORMAL LOW (ref 34.8–46.6)
HEMOGLOBIN: 7.3 g/dL — AB (ref 11.6–15.9)
LYMPH%: 49.5 % (ref 14.0–49.7)
MCH: 21 pg — ABNORMAL LOW (ref 25.1–34.0)
MCHC: 28.4 g/dL — ABNORMAL LOW (ref 31.5–36.0)
MCV: 73.9 fL — AB (ref 79.5–101.0)
MONO#: 0.5 10*3/uL (ref 0.1–0.9)
MONO%: 9.3 % (ref 0.0–14.0)
NEUT#: 2.1 10*3/uL (ref 1.5–6.5)
NEUT%: 38.9 % (ref 38.4–76.8)
PLATELETS: 306 10*3/uL (ref 145–400)
RBC: 3.48 10*6/uL — ABNORMAL LOW (ref 3.70–5.45)
RDW: 16.6 % — ABNORMAL HIGH (ref 11.2–14.5)
WBC: 5.3 10*3/uL (ref 3.9–10.3)
lymph#: 2.6 10*3/uL (ref 0.9–3.3)

## 2015-03-14 LAB — IRON AND TIBC CHCC
%SAT: 3 % — AB (ref 21–57)
IRON: 13 ug/dL — AB (ref 41–142)
TIBC: 414 ug/dL (ref 236–444)
UIBC: 401 ug/dL — AB (ref 120–384)

## 2015-03-14 MED ORDER — SODIUM CHLORIDE 0.9 % IV SOLN
Freq: Once | INTRAVENOUS | Status: AC
  Administered 2015-03-14: 09:00:00 via INTRAVENOUS

## 2015-03-14 MED ORDER — SODIUM CHLORIDE 0.9 % IV SOLN
510.0000 mg | Freq: Once | INTRAVENOUS | Status: AC
  Administered 2015-03-14: 510 mg via INTRAVENOUS
  Filled 2015-03-14: qty 17

## 2015-03-14 NOTE — Patient Instructions (Signed)

## 2015-03-19 NOTE — Telephone Encounter (Signed)
Lft another msg confirming IVF added to 04/08.Marland Kitchen... KJ

## 2015-03-21 ENCOUNTER — Ambulatory Visit (HOSPITAL_BASED_OUTPATIENT_CLINIC_OR_DEPARTMENT_OTHER): Payer: Managed Care, Other (non HMO)

## 2015-03-21 DIAGNOSIS — D509 Iron deficiency anemia, unspecified: Secondary | ICD-10-CM | POA: Diagnosis not present

## 2015-03-21 MED ORDER — SODIUM CHLORIDE 0.9 % IV SOLN
510.0000 mg | Freq: Once | INTRAVENOUS | Status: AC
  Administered 2015-03-21: 510 mg via INTRAVENOUS
  Filled 2015-03-21: qty 17

## 2015-03-21 MED ORDER — SODIUM CHLORIDE 0.9 % IV SOLN
Freq: Once | INTRAVENOUS | Status: AC
  Administered 2015-03-21: 09:00:00 via INTRAVENOUS

## 2015-03-21 NOTE — Patient Instructions (Signed)

## 2015-09-09 ENCOUNTER — Telehealth: Payer: Self-pay | Admitting: *Deleted

## 2015-09-09 ENCOUNTER — Other Ambulatory Visit: Payer: Self-pay | Admitting: Oncology

## 2015-09-09 ENCOUNTER — Encounter: Payer: Self-pay | Admitting: *Deleted

## 2015-09-09 DIAGNOSIS — N924 Excessive bleeding in the premenopausal period: Secondary | ICD-10-CM

## 2015-09-09 NOTE — Telephone Encounter (Signed)
Pt states she needs another Iron infusion. Is requesting appt.

## 2015-09-09 NOTE — Telephone Encounter (Signed)
Lab and IV iron orders are in. POF sent to schedulers.

## 2015-09-11 ENCOUNTER — Telehealth: Payer: Self-pay | Admitting: Oncology

## 2015-09-11 ENCOUNTER — Ambulatory Visit: Payer: Managed Care, Other (non HMO)

## 2015-09-11 ENCOUNTER — Encounter: Payer: Self-pay | Admitting: *Deleted

## 2015-09-11 ENCOUNTER — Other Ambulatory Visit: Payer: Managed Care, Other (non HMO)

## 2015-09-11 NOTE — Telephone Encounter (Signed)
Pt returned my call she can't do Friday, she missed today's apt I forgot to contact pt I apologized and she was fine with it. We got hung up and I r/s and called her back with new D/T.... Cherylann Banas

## 2015-09-15 ENCOUNTER — Ambulatory Visit: Payer: Managed Care, Other (non HMO)

## 2015-09-15 ENCOUNTER — Other Ambulatory Visit: Payer: Managed Care, Other (non HMO)

## 2015-09-15 ENCOUNTER — Telehealth: Payer: Self-pay | Admitting: *Deleted

## 2015-09-15 NOTE — Telephone Encounter (Signed)
Per staff message  I have scheduled appts. Advised scheduler of appts. JMW  

## 2015-09-18 ENCOUNTER — Ambulatory Visit: Payer: Managed Care, Other (non HMO)

## 2015-09-22 ENCOUNTER — Ambulatory Visit: Payer: Managed Care, Other (non HMO)

## 2015-09-23 ENCOUNTER — Telehealth: Payer: Self-pay | Admitting: Oncology

## 2015-09-23 NOTE — Telephone Encounter (Signed)
Returned patient call re appointments. Left message asking that patient call back re appointments.

## 2015-09-24 ENCOUNTER — Telehealth: Payer: Self-pay | Admitting: *Deleted

## 2015-09-24 NOTE — Telephone Encounter (Signed)
TC from patient to clarify appt times for next 2 visits for iron infusion. No other needs identified.

## 2015-09-26 ENCOUNTER — Other Ambulatory Visit (HOSPITAL_BASED_OUTPATIENT_CLINIC_OR_DEPARTMENT_OTHER): Payer: Managed Care, Other (non HMO)

## 2015-09-26 ENCOUNTER — Ambulatory Visit (HOSPITAL_BASED_OUTPATIENT_CLINIC_OR_DEPARTMENT_OTHER): Payer: Managed Care, Other (non HMO)

## 2015-09-26 VITALS — BP 108/43 | HR 67 | Temp 98.7°F | Resp 18

## 2015-09-26 DIAGNOSIS — N924 Excessive bleeding in the premenopausal period: Secondary | ICD-10-CM

## 2015-09-26 DIAGNOSIS — D509 Iron deficiency anemia, unspecified: Secondary | ICD-10-CM

## 2015-09-26 LAB — CBC WITH DIFFERENTIAL/PLATELET
BASO%: 0.8 % (ref 0.0–2.0)
Basophils Absolute: 0 10*3/uL (ref 0.0–0.1)
EOS ABS: 0.1 10*3/uL (ref 0.0–0.5)
EOS%: 1 % (ref 0.0–7.0)
HCT: 27.2 % — ABNORMAL LOW (ref 34.8–46.6)
HEMOGLOBIN: 8.1 g/dL — AB (ref 11.6–15.9)
LYMPH%: 38.9 % (ref 14.0–49.7)
MCH: 20.7 pg — ABNORMAL LOW (ref 25.1–34.0)
MCHC: 29.6 g/dL — ABNORMAL LOW (ref 31.5–36.0)
MCV: 70 fL — AB (ref 79.5–101.0)
MONO#: 0.6 10*3/uL (ref 0.1–0.9)
MONO%: 10.2 % (ref 0.0–14.0)
NEUT%: 49.1 % (ref 38.4–76.8)
NEUTROS ABS: 3 10*3/uL (ref 1.5–6.5)
Platelets: 239 10*3/uL (ref 145–400)
RBC: 3.89 10*6/uL (ref 3.70–5.45)
RDW: 17.5 % — AB (ref 11.2–14.5)
WBC: 6.1 10*3/uL (ref 3.9–10.3)
lymph#: 2.4 10*3/uL (ref 0.9–3.3)

## 2015-09-26 LAB — IRON AND TIBC CHCC
%SAT: 5 % — AB (ref 21–57)
Iron: 21 ug/dL — ABNORMAL LOW (ref 41–142)
TIBC: 405 ug/dL (ref 236–444)
UIBC: 384 ug/dL (ref 120–384)

## 2015-09-26 LAB — FERRITIN CHCC: FERRITIN: 5 ng/mL — AB (ref 9–269)

## 2015-09-26 MED ORDER — SODIUM CHLORIDE 0.9 % IV SOLN
510.0000 mg | Freq: Once | INTRAVENOUS | Status: AC
  Administered 2015-09-26: 510 mg via INTRAVENOUS
  Filled 2015-09-26: qty 17

## 2015-09-26 MED ORDER — SODIUM CHLORIDE 0.9 % IV SOLN
Freq: Once | INTRAVENOUS | Status: AC
  Administered 2015-09-26: 12:00:00 via INTRAVENOUS

## 2015-09-26 NOTE — Progress Notes (Signed)
Okay to proceed with treatment without iron and ferritin per Dr. Alen Blew.  Pt monitored 30 minutes post Feraheme infusion. Pt and VS stable at time of discharge.

## 2015-09-26 NOTE — Patient Instructions (Signed)

## 2015-10-03 ENCOUNTER — Ambulatory Visit (HOSPITAL_BASED_OUTPATIENT_CLINIC_OR_DEPARTMENT_OTHER): Payer: Managed Care, Other (non HMO)

## 2015-10-03 VITALS — BP 104/56 | HR 74 | Temp 98.6°F | Resp 18

## 2015-10-03 DIAGNOSIS — D509 Iron deficiency anemia, unspecified: Secondary | ICD-10-CM

## 2015-10-03 MED ORDER — SODIUM CHLORIDE 0.9 % IV SOLN
510.0000 mg | Freq: Once | INTRAVENOUS | Status: AC
  Administered 2015-10-03: 510 mg via INTRAVENOUS
  Filled 2015-10-03: qty 17

## 2015-10-03 MED ORDER — SODIUM CHLORIDE 0.9 % IV SOLN
Freq: Once | INTRAVENOUS | Status: AC
  Administered 2015-10-03: 14:00:00 via INTRAVENOUS

## 2015-10-03 NOTE — Progress Notes (Signed)
Per Stanford Scotland, Dr. Hazeline Junker nurse, pt is to follow up with GYN MD whom referred her for the Feraheme infusions regarding plans for further monitor iron/ferritin levels. Pt aware and verbalizes understanding stating she has an appt with her GYN MD on this coming up Tuesday.  Pt monitored for 30 minutes post-infusion. Pt and VS stable at time of discharge.

## 2015-10-03 NOTE — Patient Instructions (Signed)

## 2015-12-25 ENCOUNTER — Other Ambulatory Visit: Payer: Self-pay | Admitting: Obstetrics and Gynecology

## 2015-12-25 ENCOUNTER — Ambulatory Visit
Admission: RE | Admit: 2015-12-25 | Discharge: 2015-12-25 | Disposition: A | Discharge status: Home / Self Care | Payer: Managed Care, Other (non HMO) | Source: Ambulatory Visit | Attending: Obstetrics and Gynecology | Admitting: Obstetrics and Gynecology

## 2015-12-25 DIAGNOSIS — Z30431 Encounter for routine checking of intrauterine contraceptive device: Secondary | ICD-10-CM

## 2016-01-22 ENCOUNTER — Encounter (HOSPITAL_COMMUNITY): Payer: Self-pay | Admitting: Emergency Medicine

## 2016-01-22 ENCOUNTER — Emergency Department (HOSPITAL_COMMUNITY)
Admission: EM | Admit: 2016-01-22 | Discharge: 2016-01-22 | Disposition: A | Discharge status: Home / Self Care | Payer: Managed Care, Other (non HMO)

## 2016-01-22 ENCOUNTER — Emergency Department (HOSPITAL_COMMUNITY)
Admission: EM | Admit: 2016-01-22 | Discharge: 2016-01-22 | Discharge status: Against Medical Advice | Payer: Managed Care, Other (non HMO) | Attending: Emergency Medicine | Admitting: Emergency Medicine

## 2016-01-22 ENCOUNTER — Emergency Department (HOSPITAL_COMMUNITY): Payer: Managed Care, Other (non HMO)

## 2016-01-22 DIAGNOSIS — R079 Chest pain, unspecified: Secondary | ICD-10-CM | POA: Diagnosis present

## 2016-01-22 DIAGNOSIS — N939 Abnormal uterine and vaginal bleeding, unspecified: Secondary | ICD-10-CM | POA: Diagnosis not present

## 2016-01-22 DIAGNOSIS — N946 Dysmenorrhea, unspecified: Secondary | ICD-10-CM | POA: Insufficient documentation

## 2016-01-22 LAB — BASIC METABOLIC PANEL
ANION GAP: 11 (ref 5–15)
BUN: 12 mg/dL (ref 6–20)
CALCIUM: 9.2 mg/dL (ref 8.9–10.3)
CO2: 22 mmol/L (ref 22–32)
Chloride: 107 mmol/L (ref 101–111)
Creatinine, Ser: 0.98 mg/dL (ref 0.44–1.00)
GFR calc non Af Amer: >60 mL/min (ref >60)
Glucose, Bld: 158 mg/dL — ABNORMAL HIGH (ref 65–99)
Potassium: 4 mmol/L (ref 3.5–5.1)
SODIUM: 140 mmol/L (ref 135–145)

## 2016-01-22 LAB — CBC
HCT: 28.1 % — ABNORMAL LOW (ref 36.0–46.0)
HEMOGLOBIN: 8.2 g/dL — AB (ref 12.0–15.0)
MCH: 24.6 pg — AB (ref 26.0–34.0)
MCHC: 29.2 g/dL — ABNORMAL LOW (ref 30.0–36.0)
MCV: 84.1 fL (ref 78.0–100.0)
Platelets: 274 10*3/uL (ref 150–400)
RBC: 3.34 MIL/uL — AB (ref 3.87–5.11)
RDW: 15.2 % (ref 11.5–15.5)
WBC: 11.8 10*3/uL — AB (ref 4.0–10.5)

## 2016-01-22 LAB — I-STAT TROPONIN, ED: TROPONIN I, POC: 0 ng/mL (ref 0.00–0.08)

## 2016-01-22 MED ORDER — ONDANSETRON 4 MG PO TBDP
4.0000 mg | ORAL_TABLET | Freq: Once | ORAL | Status: AC
  Administered 2016-01-22: 4 mg via ORAL

## 2016-01-22 MED ORDER — OXYCODONE-ACETAMINOPHEN 5-325 MG PO TABS
ORAL_TABLET | ORAL | Status: AC
  Filled 2016-01-22: qty 1

## 2016-01-22 MED ORDER — OXYCODONE-ACETAMINOPHEN 5-325 MG PO TABS
1.0000 | ORAL_TABLET | Freq: Once | ORAL | Status: AC
  Administered 2016-01-22: 1 via ORAL

## 2016-01-22 MED ORDER — ONDANSETRON 4 MG PO TBDP
ORAL_TABLET | ORAL | Status: AC
  Filled 2016-01-22: qty 1

## 2016-01-22 NOTE — ED Notes (Signed)
Patient stated she cannot wait any longer and needs to lay down.  Instructed her that we are trying to get empty rooms and she really needs to stay.  Stated if she does not get feeling better she will be back and is going to call her MD int he morning.  Family present

## 2016-01-22 NOTE — ED Notes (Signed)
Patient requesting something for her "real bad menstrual cramps".  Has been vomiting in the waiting area

## 2016-01-22 NOTE — ED Notes (Signed)
Pt reports history of fibroids and started having menstrual cramps and vaginal bleeding today and around 5pm while sitting she broke out into a sweat and started having mid back pain going into her chest that was a sharp pain.

## 2016-04-08 ENCOUNTER — Other Ambulatory Visit: Payer: Self-pay | Admitting: Oncology

## 2016-04-08 ENCOUNTER — Telehealth: Payer: Self-pay | Admitting: *Deleted

## 2016-04-08 ENCOUNTER — Telehealth: Payer: Self-pay | Admitting: Oncology

## 2016-04-08 DIAGNOSIS — N924 Excessive bleeding in the premenopausal period: Secondary | ICD-10-CM

## 2016-04-08 DIAGNOSIS — D509 Iron deficiency anemia, unspecified: Secondary | ICD-10-CM

## 2016-04-08 NOTE — Telephone Encounter (Signed)
Per staff message and POF I have scheduled appts. Advised scheduler of appts. JMW  

## 2016-04-08 NOTE — Telephone Encounter (Signed)
POF sent to scheduling 

## 2016-04-08 NOTE — Telephone Encounter (Signed)
left msg for added lab/tx tommoro, and added may/july appt...requested [place] sched was not availble in july.. pt sched the next day w/ MD

## 2016-04-08 NOTE — Telephone Encounter (Signed)
"  I need an appointment for Feraheme.  I feel like I can pass out walking from the door to my car and from car to work.  The oral Iron isn't working fast enough.  Birth control pills didn't work so I'm on oral iron.  I have fibroids and will have surgery when I get my iron level up.  Return number [phone].  I will F/U again with my OB/GYN on Apr 14, 2016.

## 2016-04-09 ENCOUNTER — Ambulatory Visit (HOSPITAL_BASED_OUTPATIENT_CLINIC_OR_DEPARTMENT_OTHER): Payer: Managed Care, Other (non HMO)

## 2016-04-09 ENCOUNTER — Other Ambulatory Visit (HOSPITAL_BASED_OUTPATIENT_CLINIC_OR_DEPARTMENT_OTHER): Payer: Managed Care, Other (non HMO)

## 2016-04-09 VITALS — BP 117/49 | HR 78 | Temp 98.7°F | Resp 18

## 2016-04-09 DIAGNOSIS — D509 Iron deficiency anemia, unspecified: Secondary | ICD-10-CM

## 2016-04-09 DIAGNOSIS — N924 Excessive bleeding in the premenopausal period: Secondary | ICD-10-CM

## 2016-04-09 LAB — CBC WITH DIFFERENTIAL/PLATELET
BASO%: 0.4 % (ref 0.0–2.0)
BASOS ABS: 0 10*3/uL (ref 0.0–0.1)
EOS%: 1.1 % (ref 0.0–7.0)
Eosinophils Absolute: 0.1 10*3/uL (ref 0.0–0.5)
HCT: 20.6 % — ABNORMAL LOW (ref 34.8–46.6)
HEMOGLOBIN: 5.7 g/dL — AB (ref 11.6–15.9)
LYMPH#: 2.6 10*3/uL (ref 0.9–3.3)
LYMPH%: 34.4 % (ref 14.0–49.7)
MCH: 18.9 pg — AB (ref 25.1–34.0)
MCHC: 27.7 g/dL — ABNORMAL LOW (ref 31.5–36.0)
MCV: 68.2 fL — AB (ref 79.5–101.0)
MONO#: 0.7 10*3/uL (ref 0.1–0.9)
MONO%: 9.4 % (ref 0.0–14.0)
NEUT#: 4.1 10*3/uL (ref 1.5–6.5)
NEUT%: 54.7 % (ref 38.4–76.8)
NRBC: 1 % — AB (ref 0–0)
Platelets: 292 10*3/uL (ref 145–400)
RBC: 3.02 10*6/uL — AB (ref 3.70–5.45)
RDW: 17.5 % — AB (ref 11.2–14.5)
WBC: 7.4 10*3/uL (ref 3.9–10.3)

## 2016-04-09 LAB — IRON AND TIBC: TIBC: 436 ug/dL (ref 236–444)

## 2016-04-09 LAB — FERRITIN

## 2016-04-09 MED ORDER — SODIUM CHLORIDE 0.9 % IJ SOLN
10.0000 mL | INTRAMUSCULAR | Status: DC | PRN
  Filled 2016-04-09: qty 10

## 2016-04-09 MED ORDER — SODIUM CHLORIDE 0.9 % IV SOLN
510.0000 mg | Freq: Once | INTRAVENOUS | Status: AC
  Administered 2016-04-09: 510 mg via INTRAVENOUS
  Filled 2016-04-09: qty 17

## 2016-04-09 MED ORDER — SODIUM CHLORIDE 0.9 % IV SOLN
Freq: Once | INTRAVENOUS | Status: AC
  Administered 2016-04-09: 11:00:00 via INTRAVENOUS

## 2016-04-09 MED ORDER — HEPARIN SOD (PORK) LOCK FLUSH 100 UNIT/ML IV SOLN
500.0000 [IU] | Freq: Once | INTRAVENOUS | Status: DC | PRN
  Filled 2016-04-09: qty 5

## 2016-04-09 NOTE — Patient Instructions (Addendum)
Ferumoxytol injection What is this medicine? FERUMOXYTOL is an iron complex. Iron is used to make healthy red blood cells, which carry oxygen and nutrients throughout the body. This medicine is used to treat iron deficiency anemia in people with chronic kidney disease. This medicine may be used for other purposes; ask your health care provider or pharmacist if you have questions. What should I tell my health care provider before I take this medicine? They need to know if you have any of these conditions: -anemia not caused by low iron levels -high levels of iron in the blood -magnetic resonance imaging (MRI) test scheduled -an unusual or allergic reaction to iron, other medicines, foods, dyes, or preservatives -pregnant or trying to get pregnant -breast-feeding How should I use this medicine? This medicine is for injection into a vein. It is given by a health care professional in a hospital or clinic setting. Talk to your pediatrician regarding the use of this medicine in children. Special care may be needed. Overdosage: If you think you have taken too much of this medicine contact a poison control center or emergency room at once. NOTE: This medicine is only for you. Do not share this medicine with others. What if I miss a dose? It is important not to miss your dose. Call your doctor or health care professional if you are unable to keep an appointment. What may interact with this medicine? This medicine may interact with the following medications: -other iron products This list may not describe all possible interactions. Give your health care provider a list of all the medicines, herbs, non-prescription drugs, or dietary supplements you use. Also tell them if you smoke, drink alcohol, or use illegal drugs. Some items may interact with your medicine. What should I watch for while using this medicine? Visit your doctor or healthcare professional regularly. Tell your doctor or healthcare  professional if your symptoms do not start to get better or if they get worse. You may need blood work done while you are taking this medicine. You may need to follow a special diet. Talk to your doctor. Foods that contain iron include: whole grains/cereals, dried fruits, beans, or peas, leafy green vegetables, and organ meats (liver, kidney). What side effects may I notice from receiving this medicine? Side effects that you should report to your doctor or health care professional as soon as possible: -allergic reactions like skin rash, itching or hives, swelling of the face, lips, or tongue -breathing problems -changes in blood pressure -feeling faint or lightheaded, falls -fever or chills -flushing, sweating, or hot feelings -swelling of the ankles or feet Side effects that usually do not require medical attention (Report these to your doctor or health care professional if they continue or are bothersome.): -diarrhea -headache -nausea, vomiting -stomach pain This list may not describe all possible side effects. Call your doctor for medical advice about side effects. You may report side effects to FDA at 1-800-FDA-1088. Where should I keep my medicine? This drug is given in a hospital or clinic and will not be stored at home. NOTE: This sheet is a summary. It may not cover all possible information. If you have questions about this medicine, talk to your doctor, pharmacist, or health care provider.    2016, Elsevier/Gold Standard. (2012-07-14 15:23:36) Iron-Rich Diet Iron is a mineral that helps your body to produce hemoglobin. Hemoglobin is a protein in your red blood cells that carries oxygen to your body's tissues. Eating too little iron may cause you to  feel weak and tired, and it can increase your risk for infection. Eating enough iron is necessary for your body's metabolism, muscle function, and nervous system. Iron is naturally found in many foods. It can also be added to foods or  fortified in foods. There are two types of dietary iron:  Heme iron. Heme iron is absorbed by the body more easily than nonheme iron. Heme iron is found in meat, poultry, and fish.  Nonheme iron. Nonheme iron is found in dietary supplements, iron-fortified grains, beans, and vegetables. You may need to follow an iron-rich diet if:  You have been diagnosed with iron deficiency or iron-deficiency anemia.  You have a condition that prevents you from absorbing dietary iron, such as:  Infection in your intestines.  Celiac disease. This involves long-lasting (chronic) inflammation of your intestines.  You do not eat enough iron.  You eat a diet that is high in foods that impair iron absorption.  You have lost a lot of blood.  You have heavy bleeding during your menstrual cycle.  You are pregnant. WHAT IS MY PLAN? Your health care provider may help you to determine how much iron you need per day based on your condition. Generally, when a person consumes sufficient amounts of iron in the diet, the following iron needs are met:  Men.  43-59 years old: 11 mg per day.  64-53 years old: 8 mg per day.  Women.   80-36 years old: 15 mg per day.  65-31 years old: 18 mg per day.  Over 61 years old: 8 mg per day.  Pregnant women: 27 mg per day.  Breastfeeding women: 9 mg per day. WHAT DO I NEED TO KNOW ABOUT AN IRON-RICH DIET?  Eat fresh fruits and vegetables that are high in vitamin C along with foods that are high in iron. This will help increase the amount of iron that your body absorbs from food, especially with foods containing nonheme iron. Foods that are high in vitamin C include oranges, peppers, tomatoes, and mango.  Take iron supplements only as directed by your health care provider. Overdose of iron can be life-threatening. If you were prescribed iron supplements, take them with orange juice or a vitamin C supplement.  Cook foods in pots and pans that are made from iron.    Eat nonheme iron-containing foods alongside foods that are high in heme iron. This helps to improve your iron absorption.   Certain foods and drinks contain compounds that impair iron absorption. Avoid eating these foods in the same meal as iron-rich foods or with iron supplements. These include:  Coffee, black tea, and red wine.  Milk, dairy products, and foods that are high in calcium.  Beans, soybeans, and peas.  Whole grains.  When eating foods that contain both nonheme iron and compounds that impair iron absorption, follow these tips to absorb iron better.   Soak beans overnight before cooking.  Soak whole grains overnight and drain them before using.  Ferment flours before baking, such as using yeast in bread dough. WHAT FOODS CAN I EAT? Grains Iron-fortified breakfast cereal. Iron-fortified whole-wheat bread. Enriched rice. Sprouted grains. Vegetables Spinach. Potatoes with skin. Green peas. Broccoli. Red and green bell peppers. Fermented vegetables. Fruits Prunes. Raisins. Oranges. Strawberries. Mango. Grapefruit. Meats and Other Protein Sources Beef liver. Oysters. Beef. Shrimp. [place]. Chicken. Ray. Sardines. Chickpeas. Nuts. Tofu. Beverages Tomato juice. Fresh orange juice. Prune juice. Hibiscus tea. Fortified instant breakfast shakes. Condiments Tahini. Fermented soy sauce. Sweets and Desserts Black-strap  molasses.  Other Wheat germ. The items listed above may not be a complete list of recommended foods or beverages. Contact your dietitian for more options. WHAT FOODS ARE NOT RECOMMENDED? Grains Whole grains. Bran cereal. Bran flour. Oats. Vegetables Artichokes. Brussels sprouts. Kale. Fruits Blueberries. Raspberries. Strawberries. Figs. Meats and Other Protein Sources Soybeans. Products made from soy protein. Dairy Milk. Cream. Cheese. Yogurt. Cottage cheese. Beverages Coffee. Black tea. Red wine. Sweets and Desserts Cocoa.  Chocolate. Ice cream. Other Basil. Oregano. Parsley. The items listed above may not be a complete list of foods and beverages to avoid. Contact your dietitian for more information.   This information is not intended to replace advice given to you by your health care provider. Make sure you discuss any questions you have with your health care provider.   Document Released: 07/13/2005 Document Revised: 12/20/2014 Document Reviewed: 06/26/2014 Elsevier Interactive Patient Education [employer].

## 2016-04-13 ENCOUNTER — Telehealth: Payer: Self-pay | Admitting: Oncology

## 2016-04-13 NOTE — Telephone Encounter (Signed)
pt called to r/s appt....pt ok and aware

## 2016-04-16 ENCOUNTER — Ambulatory Visit: Payer: Managed Care, Other (non HMO)

## 2016-04-19 ENCOUNTER — Ambulatory Visit (HOSPITAL_BASED_OUTPATIENT_CLINIC_OR_DEPARTMENT_OTHER): Payer: Managed Care, Other (non HMO)

## 2016-04-19 VITALS — BP 112/55 | HR 75 | Temp 98.5°F | Resp 18

## 2016-04-19 DIAGNOSIS — D509 Iron deficiency anemia, unspecified: Secondary | ICD-10-CM

## 2016-04-19 MED ORDER — SODIUM CHLORIDE 0.9 % IV SOLN
510.0000 mg | Freq: Once | INTRAVENOUS | Status: AC
  Administered 2016-04-19: 510 mg via INTRAVENOUS
  Filled 2016-04-19: qty 17

## 2016-04-19 MED ORDER — SODIUM CHLORIDE 0.9 % IV SOLN
Freq: Once | INTRAVENOUS | Status: AC
  Administered 2016-04-19: 09:00:00 via INTRAVENOUS

## 2016-04-19 NOTE — Patient Instructions (Signed)

## 2016-07-05 ENCOUNTER — Other Ambulatory Visit: Payer: Managed Care, Other (non HMO)

## 2016-07-06 ENCOUNTER — Ambulatory Visit: Payer: Managed Care, Other (non HMO) | Admitting: Oncology

## 2016-07-06 ENCOUNTER — Other Ambulatory Visit: Payer: Managed Care, Other (non HMO)

## 2016-07-08 ENCOUNTER — Telehealth: Payer: Self-pay | Admitting: *Deleted

## 2016-07-08 ENCOUNTER — Telehealth: Payer: Self-pay | Admitting: Oncology

## 2016-07-08 NOTE — Telephone Encounter (Signed)
pt called to resched miss apt to 2 nd week of august, pt resched for 8/11 @ 9

## 2016-07-08 NOTE — Telephone Encounter (Signed)
Patient called and canceled appts on 8/11. She will call back to reschedule

## 2016-07-23 ENCOUNTER — Other Ambulatory Visit: Payer: Managed Care, Other (non HMO)

## 2016-07-23 ENCOUNTER — Ambulatory Visit: Payer: Managed Care, Other (non HMO) | Admitting: Oncology

## 2016-08-13 ENCOUNTER — Encounter: Payer: Self-pay | Admitting: *Deleted

## 2016-08-13 ENCOUNTER — Telehealth: Payer: Self-pay | Admitting: *Deleted

## 2016-08-13 ENCOUNTER — Other Ambulatory Visit: Payer: Self-pay | Admitting: *Deleted

## 2016-08-13 DIAGNOSIS — N924 Excessive bleeding in the premenopausal period: Secondary | ICD-10-CM

## 2016-08-13 NOTE — Telephone Encounter (Signed)
"   May I speak with nurse.  Awaiting return call."  Collaborative nurse reports she has sent scheduling communication and a scheduler will call patient.  No further questions.

## 2016-08-13 NOTE — Telephone Encounter (Signed)
She needs to see Mid level + with labs (CBC) in the next available slot.

## 2016-08-13 NOTE — Telephone Encounter (Signed)
Patient calling to say she is having headaches again and would like to make an appt with dr Alen Blew.

## 2016-08-15 ENCOUNTER — Telehealth: Payer: Self-pay | Admitting: Oncology

## 2016-08-15 NOTE — Telephone Encounter (Signed)
Lvm advising anext appt 9/11 @ 2.15pm.

## 2016-08-17 ENCOUNTER — Telehealth: Payer: Self-pay | Admitting: Oncology

## 2016-08-17 NOTE — Telephone Encounter (Signed)
PT AWARE OF APPT ON 08/23/16. 08/17/16

## 2016-08-20 ENCOUNTER — Telehealth: Payer: Self-pay

## 2016-08-23 ENCOUNTER — Other Ambulatory Visit: Payer: Managed Care, Other (non HMO)

## 2016-08-23 ENCOUNTER — Ambulatory Visit: Payer: Managed Care, Other (non HMO) | Admitting: Oncology

## 2016-09-02 ENCOUNTER — Other Ambulatory Visit (HOSPITAL_BASED_OUTPATIENT_CLINIC_OR_DEPARTMENT_OTHER): Payer: Managed Care, Other (non HMO)

## 2016-09-02 ENCOUNTER — Telehealth: Payer: Self-pay | Admitting: Oncology

## 2016-09-02 ENCOUNTER — Ambulatory Visit (HOSPITAL_BASED_OUTPATIENT_CLINIC_OR_DEPARTMENT_OTHER): Payer: Managed Care, Other (non HMO) | Admitting: Oncology

## 2016-09-02 VITALS — BP 126/58 | HR 97 | Temp 98.8°F | Resp 20 | Ht 63.0 in | Wt 245.8 lb

## 2016-09-02 DIAGNOSIS — D509 Iron deficiency anemia, unspecified: Secondary | ICD-10-CM

## 2016-09-02 DIAGNOSIS — D259 Leiomyoma of uterus, unspecified: Secondary | ICD-10-CM

## 2016-09-02 DIAGNOSIS — N920 Excessive and frequent menstruation with regular cycle: Secondary | ICD-10-CM

## 2016-09-02 DIAGNOSIS — N924 Excessive bleeding in the premenopausal period: Secondary | ICD-10-CM

## 2016-09-02 LAB — CBC WITH DIFFERENTIAL/PLATELET
BASO%: 1 % (ref 0.0–2.0)
BASOS ABS: 0.1 10*3/uL (ref 0.0–0.1)
EOS%: 1.9 % (ref 0.0–7.0)
Eosinophils Absolute: 0.2 10*3/uL (ref 0.0–0.5)
HEMATOCRIT: 23.5 % — AB (ref 34.8–46.6)
HGB: 6.9 g/dL — CL (ref 11.6–15.9)
LYMPH#: 2.7 10*3/uL (ref 0.9–3.3)
LYMPH%: 32.9 % (ref 14.0–49.7)
MCH: 20.6 pg — AB (ref 25.1–34.0)
MCHC: 29.3 g/dL — AB (ref 31.5–36.0)
MCV: 70.3 fL — ABNORMAL LOW (ref 79.5–101.0)
MONO#: 0.7 10*3/uL (ref 0.1–0.9)
MONO%: 8.5 % (ref 0.0–14.0)
NEUT#: 4.6 10*3/uL (ref 1.5–6.5)
NEUT%: 55.7 % (ref 38.4–76.8)
Platelets: 310 10*3/uL (ref 145–400)
RBC: 3.34 10*6/uL — ABNORMAL LOW (ref 3.70–5.45)
RDW: 17.2 % — AB (ref 11.2–14.5)
WBC: 8.2 10*3/uL (ref 3.9–10.3)

## 2016-09-02 NOTE — Telephone Encounter (Signed)
Message sent to infusion scheduler to add infusion. Patient requested 09/07/16 for 1st infusion, due to work schedule. Lab and MD appt scheduled. Avs report and appointment schedule given to patient, per 09/02/16 los.

## 2016-09-02 NOTE — Progress Notes (Signed)
Hematology and Oncology Follow Up Visit  RAYSSA STRAMEL [id number] 20-Mar-1976 40 y.o. 09/02/2016 3:57 PM READE,ROBERT Sheppard Coil, MDReade, [name], MD   Principle Diagnosis: 40 year old woman with iron deficiency anemia related to menstrual blood losses dating back to 2015. She presented with a hemoglobin of 7.4, MCV of 67 and ferritin of less than 7.   Prior Therapy: She is status post Feraheme infusion on multiple occasions most recent of which in May 2017.  Current therapy: Supportive care only and repeat IV iron as needed. She declined oral iron replacement.  Interim History: Ms. Defiore presents today for a follow-up visit. She is a pleasant woman with recurrent iron deficiency anemia related to menorrhagia from uterine fibroids. She had multiple interventions in the past regarding this issue and she is under consideration for embolization. She received intravenous iron in May 2017 with excellent improvement in her symptoms however she is reporting recurrence of her iron deficiency symptoms. These include headaches, excessive fatigue and tiredness and ice cravings. She denied any chest pain or shortness of breath she denied any dizziness or syncope.  She does not report any blurred vision or syncope. She did not report any seizures. She did not report any chest pain or palpitation but does report exertional dyspnea. She did not report any cough or wheezing. He did not report any nausea or vomiting but does report some occasional abdominal pain and dyspepsia related to the iron. She has not reported any frequency, urgency or hematuria. Did not report any skeletal complaints of arthralgias or myalgias.  Rest of the review of systems unremarkable.   Medications: I have reviewed the patient's current medications.  Current Outpatient Prescriptions  Medication Sig Dispense Refill  . acetaminophen (TYLENOL) 325 MG tablet Take 650 mg by mouth every 6 (six) hours as needed for moderate pain or headache.     Marland Kitchen HYDROcodone-acetaminophen (NORCO/VICODIN) 5-325 MG per tablet Take 1 tablet by mouth every 6 (six) hours as needed for moderate pain. 30 tablet 0  . ibuprofen (ADVIL,MOTRIN) 600 MG tablet Ibuprofen 600 mg every 6 hours for 3 days, then every 6 hours as needed for pain 60 tablet 1   No current facility-administered medications for this visit.      Allergies: No Known Allergies  Past Medical History, Surgical history, Social history, and Family History were reviewed and updated.  Physical Exam: Blood pressure (!) 126/58, pulse 97, temperature 98.8 F (37.1 C), temperature source Oral, resp. rate 20, height 5\' 3"  (1.6 m), weight 245 lb 12.8 oz (111.5 kg), SpO2 100 %. ECOG: 0 General appearance: alert and cooperative Head: Normocephalic, without obvious abnormality Neck: no adenopathy Lymph nodes: Cervical, supraclavicular, and axillary nodes normal. Heart:regular rate and rhythm, S1, S2 normal, no murmur, click, rub or gallop Lung:chest clear, no wheezing, rales, normal symmetric air entry Abdomin: soft, non-tender, without masses or organomegaly EXT:no erythema, induration, or nodules   Lab Results: Lab Results  Component Value Date   WBC 8.2 09/02/2016   HGB 6.9 (LL) 09/02/2016   HCT 23.5 (L) 09/02/2016   MCV 70.3 (L) 09/02/2016   PLT 310 09/02/2016     Chemistry      Component Value Date/Time   NA 140 01/22/2016 1940   NA 141 08/22/2014 1353   K 4.0 01/22/2016 1940   K 4.1 08/22/2014 1353   CL 107 01/22/2016 1940   CO2 22 01/22/2016 1940   CO2 25 08/22/2014 1353   BUN 12 01/22/2016 1940   BUN 10.6 08/22/2014 1353  CREATININE 0.98 01/22/2016 1940   CREATININE 1.0 08/22/2014 1353      Component Value Date/Time   CALCIUM 9.2 01/22/2016 1940   CALCIUM 9.0 08/22/2014 1353   ALKPHOS 54 02/17/2015 1035   ALKPHOS 83 08/22/2014 1353   AST 14 02/17/2015 1035   AST 19 08/22/2014 1353   ALT 13 02/17/2015 1035   ALT 16 08/22/2014 1353   BILITOT 0.5 02/17/2015 1035    BILITOT 0.57 08/22/2014 1353        Impression and Plan:  40 year old woman with the following issues:  1. Iron deficiency anemia diagnosed in 2015 and has been recurrent because of menorrhagia. She is status post intravenous iron because of intolerance to oral iron with excellent response in the past.   Her hemoglobin is low at this point at 6.9 with MCV at 70. Her ferritin level was less than 4 in April 2017. Risks and benefits of retreatment with Feraheme were discussed again and she is agreeable to proceed. I plan on repeating her iron studies in short duration to ensure adequate stores in the near future.  2. Menorrhagia related to uterine fibroids: She is following up with her OB/GYN regarding this issue to address different ways to permanently resolve this issue. Until this issue resolves, she will continue require intravenous iron periodically.  3. Follow-up: Will be in the immediate future to receive intravenous iron and recheck her iron stores and 6-8 weeks.   Zola Button, MD 9/21/20173:57 PM

## 2016-09-03 ENCOUNTER — Telehealth: Payer: Self-pay | Admitting: *Deleted

## 2016-09-03 LAB — IRON AND TIBC
%SAT: 3 % — AB (ref 21–57)
IRON: 14 ug/dL — AB (ref 41–142)
TIBC: 456 ug/dL — AB (ref 236–444)
UIBC: 443 ug/dL — ABNORMAL HIGH (ref 120–384)

## 2016-09-03 LAB — FERRITIN: FERRITIN: 4 ng/mL — AB (ref 9–269)

## 2016-09-03 NOTE — Telephone Encounter (Signed)
Per LOS I have scheduled appts and notified the scheduler 

## 2016-09-07 ENCOUNTER — Ambulatory Visit (HOSPITAL_BASED_OUTPATIENT_CLINIC_OR_DEPARTMENT_OTHER): Payer: Managed Care, Other (non HMO)

## 2016-09-07 ENCOUNTER — Other Ambulatory Visit: Payer: Self-pay | Admitting: Oncology

## 2016-09-07 ENCOUNTER — Ambulatory Visit (HOSPITAL_BASED_OUTPATIENT_CLINIC_OR_DEPARTMENT_OTHER): Payer: Managed Care, Other (non HMO) | Admitting: Nurse Practitioner

## 2016-09-07 VITALS — BP 115/60 | HR 79 | Temp 98.9°F | Resp 18

## 2016-09-07 DIAGNOSIS — D509 Iron deficiency anemia, unspecified: Secondary | ICD-10-CM

## 2016-09-07 DIAGNOSIS — T7840XA Allergy, unspecified, initial encounter: Secondary | ICD-10-CM

## 2016-09-07 MED ORDER — METHYLPREDNISOLONE SODIUM SUCC 125 MG IJ SOLR
60.0000 mg | Freq: Once | INTRAMUSCULAR | Status: AC
  Administered 2016-09-07: 60 mg via INTRAVENOUS

## 2016-09-07 MED ORDER — SODIUM CHLORIDE 0.9 % IV SOLN
Freq: Once | INTRAVENOUS | Status: AC
  Administered 2016-09-07: 15:00:00 via INTRAVENOUS

## 2016-09-07 MED ORDER — DIPHENHYDRAMINE HCL 50 MG/ML IJ SOLN
INTRAMUSCULAR | Status: AC
Start: 2016-09-07 — End: 2016-09-07
  Filled 2016-09-07: qty 1

## 2016-09-07 MED ORDER — METHYLPREDNISOLONE SODIUM SUCC 125 MG IJ SOLR
INTRAMUSCULAR | Status: AC
  Filled 2016-09-07: qty 2

## 2016-09-07 MED ORDER — SODIUM CHLORIDE 0.9 % IV SOLN
510.0000 mg | Freq: Once | INTRAVENOUS | Status: AC
  Administered 2016-09-07: 510 mg via INTRAVENOUS
  Filled 2016-09-07: qty 17

## 2016-09-07 MED ORDER — DIPHENHYDRAMINE HCL 50 MG/ML IJ SOLN
25.0000 mg | Freq: Once | INTRAMUSCULAR | Status: AC
  Administered 2016-09-07: 25 mg via INTRAVENOUS

## 2016-09-07 MED ORDER — FAMOTIDINE IN NACL 20-0.9 MG/50ML-% IV SOLN
20.0000 mg | Freq: Two times a day (BID) | INTRAVENOUS | Status: DC
  Administered 2016-09-07: 20 mg via INTRAVENOUS

## 2016-09-07 MED ORDER — FAMOTIDINE IN NACL 20-0.9 MG/50ML-% IV SOLN
INTRAVENOUS | Status: AC
  Filled 2016-09-07: qty 50

## 2016-09-07 NOTE — Patient Instructions (Signed)

## 2016-09-07 NOTE — Progress Notes (Signed)
During feraheme adminstration pt began to experience itching between legs and lower back. Pt also complained of nausea. Medication stopped and pt flushing with NS. Called Selena Lesser, NP and told to give 25mg  benadryl IV and 20mg  pepcid IVPB. After visit to pt, Cyndee also added 60mg  solu-medrol. Symptoms resolved and feraheme restarted at 464mL/hr per Cyndee, NP.

## 2016-09-07 NOTE — Progress Notes (Signed)
verified with Selena Lesser, RNP, vss, no further reaction from ferhemme after infusion restarted. Pt stable at time of discharge.

## 2016-09-08 ENCOUNTER — Encounter: Payer: Self-pay | Admitting: Nurse Practitioner

## 2016-09-08 DIAGNOSIS — T7840XA Allergy, unspecified, initial encounter: Secondary | ICD-10-CM | POA: Insufficient documentation

## 2016-09-08 NOTE — Progress Notes (Signed)
SY[facility]OM [facility]NA[facility]EMENT [facility]    Chief Complaint: Hypersensitivity reaction  HPI:  Kathy Werner 40 y.o. female diagnosed with deficiency anemia.  Currently undergoing Feraheme infusions on an as-needed basis    No history exists.    Review of Systems  Gastrointestinal: Positive for nausea.  Skin: Positive for itching. Negative for rash.  All other systems reviewed and are negative.   Past Medical History:  Diagnosis Date  . Anemia   . Fibroid   . Menorrhagia 03/2014  . Migraines     Past Surgical History:  Procedure Laterality Date  . DILATATION & CURETTAGE/HYSTEROSCOPY WITH MYOSURE N/A 02/17/2015   Procedure: DILATATION & CURETTAGE/HYSTEROSCOPY WITH MYOSURE;  Surgeon: Eldred Manges, MD;  Location: [facility];  Service: Gynecology;  Laterality: N/A;  . IUD REMOVAL  06/2013   due to worsening dysmenorrhea  . MIRENA     Inserted 01-24-13  . WISDOM TOOTH EXTRACTION      has OBESITY, NOS; TENSION HEADACHE; Heel pain; Migraines; Fibroids; Iron deficiency anemia; Menorrhagia; and Hypersensitivity reaction on her problem list.    has No Known Allergies.    Medication List       Accurate as of 09/07/16 11:59 PM. Always use your most recent med list.          acetaminophen 325 MG tablet Commonly known as:  TYLENOL Take 650 mg by mouth every 6 (six) hours as needed for moderate pain or headache.   HYDROcodone-acetaminophen 5-325 MG tablet Commonly known as:  NORCO/VICODIN Take 1 tablet by mouth every 6 (six) hours as needed for moderate pain.   ibuprofen 600 MG tablet Commonly known as:  ADVIL,MOTRIN Ibuprofen 600 mg every 6 hours for 3 days, then every 6 hours as needed for pain        PHYSICAL EXAMINATION  Oncology Vitals 09/07/2016 09/07/2016  Height - -  Weight - -  Weight (lbs) - -  BMI (kg/m2) - -  Temp 98.9 98.7  Pulse 79 79  Resp 18 18  SpO2 100 100  BSA (m2) - -   BP Readings from Last 2 Encounters:  09/07/16 115/60  09/02/16 (!) 126/58     Physical Exam  Constitutional: She is oriented to person, place, and time and well-developed, well-nourished, and in no distress.  HENT:  Head: Normocephalic and atraumatic.  Eyes: Conjunctivae and EOM are normal. Pupils are equal, round, and reactive to light.  Neck: Normal range of motion.  Pulmonary/Chest: Effort normal. No stridor. No respiratory distress.  Musculoskeletal: Normal range of motion.  Neurological: She is alert and oriented to person, place, and time.  Skin: Skin is warm and dry. No rash noted.  Psychiatric: Affect normal.  Nursing note and vitals reviewed.   LABORATORY DATA:. No visits with results within 3 Day(s) from this visit.  Latest known visit with results is:  Appointment on 09/02/2016  Component Date Value Ref Range Status  . Iron 09/03/2016 14* 41 - 142 ug/dL Final  . TIBC 09/03/2016 456* 236 - 444 ug/dL Final  . UIBC 09/03/2016 443* 120 - 384 ug/dL Final  . %SAT 09/03/2016 3* 21 - 57 % Final  . Ferritin 09/03/2016 4* 9 - 269 ng/ml Final  . WBC 09/02/2016 8.2  3.9 - 10.3 10e3/uL Final  . NEUT# 09/02/2016 4.6  1.5 - 6.5 10e3/uL Final  . HGB 09/02/2016 6.9* 11.6 - 15.9 g/dL Final  . HCT 09/02/2016 23.5* 34.8 - 46.6 % Final  . Platelets 09/02/2016 310  145 -  400 10e3/uL Final  . MCV 09/02/2016 70.3* 79.5 - 101.0 fL Final  . MCH 09/02/2016 20.6* 25.1 - 34.0 pg Final  . MCHC 09/02/2016 29.3* 31.5 - 36.0 g/dL Final  . RBC 09/02/2016 3.34* 3.70 - 5.45 10e6/uL Final  . RDW 09/02/2016 17.2* 11.2 - 14.5 % Final  . lymph# 09/02/2016 2.7  0.9 - 3.3 10e3/uL Final  . MONO# 09/02/2016 0.7  0.1 - 0.9 10e3/uL Final  . Eosinophils Absolute 09/02/2016 0.2  0.0 - 0.5 10e3/uL Final  . Basophils Absolute 09/02/2016 0.1  0.0 - 0.1 10e3/uL Final  . NEUT% 09/02/2016 55.7  38.4 - 76.8 % Final  . LYMPH% 09/02/2016 32.9  14.0 - 49.7 % Final  . MONO% 09/02/2016 8.5  0.0 - 14.0 % Final  . EOS% 09/02/2016 1.9  0.0 - 7.0 % Final  . BASO% 09/02/2016 1.0  0.0 - 2.0 % Final     RADIOGRAPHIC STUDIES: No results found.  ASSESSMENT/PLAN:    Iron deficiency anemia Patient has a history of chronic anemia.  She presented to the [facility] today to receive a Feraheme infusion.  She developed a mild hypersensitivity reaction-which was managed per hypersensitivity protocol.  See further notes for details.  Patient is scheduled to return for additional Feraheme infusion on 09/14/2016.  She is scheduled to return for labs and a follow-up visit on 10/20/2016.  Hypersensitivity reaction Patient has a history of chronic anemia.  She presented to the [facility] today to receive a Feraheme infusion.  She developed a mild hypersensitivity reaction which consisted of some mild nausea and itching.  She denied any other issues whatsoever.  Exam revealed no rash or hives.  Patient ate a snack and had some soda; and all nausea symptoms resolved.  Patient was also given Benadryl 25 mg and Pepcid 20 mg; and also received Solu-Medrol 60 mg IV-and all hypersensitivity reaction symptoms resolved.  Patient was able to complete her Feraheme infusion today with no further issues whatsoever.  Patient was also given printed instructions which were reviewed by the nurse regarding the use of both Benadryl and Pepcid at home for any mild reaction symptoms.  She was encouraged to directly to the emergency department for any worsening symptoms whatsoever.   Patient stated understanding of all instructions; and was in agreement with this plan of care. The patient knows to call the clinic with any problems, questions or concerns.   Total time spent with patient was 15 minutes;  with greater than 75 percent of that time spent in face to face counseling regarding patient's symptoms,  and coordination of care and follow up.  Disclaimer:This dictation was prepared with Dragon/digital dictation along with [employer]. Any transcriptional errors that result from this process are  unintentional.  Drue Second, NP 09/08/2016

## 2016-09-08 NOTE — Assessment & Plan Note (Signed)
Patient has a history of chronic anemia.  She presented to the [facility] today to receive a Feraheme infusion.  She developed a mild hypersensitivity reaction which consisted of some mild nausea and itching.  She denied any other issues whatsoever.  Exam revealed no rash or hives.  Patient ate a snack and had some soda; and all nausea symptoms resolved.  Patient was also given Benadryl 25 mg and Pepcid 20 mg; and also received Solu-Medrol 60 mg IV-and all hypersensitivity reaction symptoms resolved.  Patient was able to complete her Feraheme infusion today with no further issues whatsoever.  Patient was also given printed instructions which were reviewed by the nurse regarding the use of both Benadryl and Pepcid at home for any mild reaction symptoms.  She was encouraged to directly to the emergency department for any worsening symptoms whatsoever.

## 2016-09-08 NOTE — Assessment & Plan Note (Signed)
Patient has a history of chronic anemia.  She presented to the [facility] today to receive a Feraheme infusion.  She developed a mild hypersensitivity reaction-which was managed per hypersensitivity protocol.  See further notes for details.  Patient is scheduled to return for additional Feraheme infusion on 09/14/2016.  She is scheduled to return for labs and a follow-up visit on 10/20/2016.

## 2016-09-14 ENCOUNTER — Ambulatory Visit (HOSPITAL_BASED_OUTPATIENT_CLINIC_OR_DEPARTMENT_OTHER): Payer: Managed Care, Other (non HMO)

## 2016-09-14 VITALS — BP 125/58 | HR 70 | Temp 98.7°F | Resp 17

## 2016-09-14 DIAGNOSIS — D509 Iron deficiency anemia, unspecified: Secondary | ICD-10-CM | POA: Diagnosis not present

## 2016-09-14 DIAGNOSIS — D508 Other iron deficiency anemias: Secondary | ICD-10-CM

## 2016-09-14 MED ORDER — SODIUM CHLORIDE 0.9 % IV SOLN
510.0000 mg | Freq: Once | INTRAVENOUS | Status: AC
  Administered 2016-09-14: 510 mg via INTRAVENOUS
  Filled 2016-09-14: qty 17

## 2016-09-14 MED ORDER — SODIUM CHLORIDE 0.9 % IV SOLN
Freq: Once | INTRAVENOUS | Status: AC
  Administered 2016-09-14: 15:00:00 via INTRAVENOUS

## 2016-09-14 MED ORDER — FAMOTIDINE IN NACL 20-0.9 MG/50ML-% IV SOLN
20.0000 mg | Freq: Once | INTRAVENOUS | Status: AC
  Administered 2016-09-14: 20 mg via INTRAVENOUS

## 2016-09-14 MED ORDER — METHYLPREDNISOLONE SODIUM SUCC 125 MG IJ SOLR
60.0000 mg | Freq: Once | INTRAMUSCULAR | Status: AC
  Administered 2016-09-14: 60 mg via INTRAVENOUS

## 2016-09-14 MED ORDER — DIPHENHYDRAMINE HCL 50 MG/ML IJ SOLN
INTRAMUSCULAR | Status: AC
  Filled 2016-09-14: qty 1

## 2016-09-14 MED ORDER — DIPHENHYDRAMINE HCL 50 MG/ML IJ SOLN
25.0000 mg | Freq: Once | INTRAMUSCULAR | Status: AC
  Administered 2016-09-14: 25 mg via INTRAVENOUS

## 2016-09-14 MED ORDER — FAMOTIDINE IN NACL 20-0.9 MG/50ML-% IV SOLN
INTRAVENOUS | Status: AC
  Filled 2016-09-14: qty 50

## 2016-09-14 MED ORDER — METHYLPREDNISOLONE SODIUM SUCC 125 MG IJ SOLR
INTRAMUSCULAR | Status: AC
Start: 2016-09-14 — End: 2016-09-14
  Filled 2016-09-14: qty 2

## 2016-09-14 NOTE — Patient Instructions (Signed)

## 2016-10-20 ENCOUNTER — Other Ambulatory Visit (HOSPITAL_BASED_OUTPATIENT_CLINIC_OR_DEPARTMENT_OTHER): Payer: Managed Care, Other (non HMO)

## 2016-10-20 ENCOUNTER — Ambulatory Visit (HOSPITAL_BASED_OUTPATIENT_CLINIC_OR_DEPARTMENT_OTHER): Payer: Managed Care, Other (non HMO) | Admitting: Oncology

## 2016-10-20 ENCOUNTER — Telehealth: Payer: Self-pay | Admitting: Oncology

## 2016-10-20 VITALS — BP 112/56 | HR 62 | Temp 98.3°F | Resp 18 | Ht 63.0 in | Wt 250.6 lb

## 2016-10-20 DIAGNOSIS — D509 Iron deficiency anemia, unspecified: Secondary | ICD-10-CM

## 2016-10-20 DIAGNOSIS — N924 Excessive bleeding in the premenopausal period: Secondary | ICD-10-CM

## 2016-10-20 DIAGNOSIS — N920 Excessive and frequent menstruation with regular cycle: Secondary | ICD-10-CM | POA: Diagnosis not present

## 2016-10-20 DIAGNOSIS — D259 Leiomyoma of uterus, unspecified: Secondary | ICD-10-CM

## 2016-10-20 LAB — CBC WITH DIFFERENTIAL/PLATELET
BASO%: 0.3 % (ref 0.0–2.0)
Basophils Absolute: 0 10*3/uL (ref 0.0–0.1)
EOS%: 1.5 % (ref 0.0–7.0)
Eosinophils Absolute: 0.1 10*3/uL (ref 0.0–0.5)
HCT: 33.2 % — ABNORMAL LOW (ref 34.8–46.6)
HGB: 10.4 g/dL — ABNORMAL LOW (ref 11.6–15.9)
LYMPH%: 41.7 % (ref 14.0–49.7)
MCH: 26.1 pg (ref 25.1–34.0)
MCHC: 31.3 g/dL — ABNORMAL LOW (ref 31.5–36.0)
MCV: 83.2 fL (ref 79.5–101.0)
MONO#: 0.7 10*3/uL (ref 0.1–0.9)
MONO%: 9 % (ref 0.0–14.0)
NEUT%: 47.5 % (ref 38.4–76.8)
NEUTROS ABS: 3.5 10*3/uL (ref 1.5–6.5)
Platelets: 260 10*3/uL (ref 145–400)
RBC: 3.99 10*6/uL (ref 3.70–5.45)
RDW: 22.6 % — ABNORMAL HIGH (ref 11.2–14.5)
WBC: 7.3 10*3/uL (ref 3.9–10.3)
lymph#: 3 10*3/uL (ref 0.9–3.3)

## 2016-10-20 NOTE — Progress Notes (Signed)
Hematology and Oncology Follow Up Visit  Kathy Werner [id number] 1976-06-03 40 y.o. 10/20/2016 3:21 PM READE,Kathy Werner, Kathy [place], MD   Principle Diagnosis: 40 year old woman with iron deficiency anemia related to menstrual blood losses dating back to 2015. She presented with a hemoglobin of 7.4, MCV of 67 and ferritin of less than 7.   Prior Therapy: She is status post Feraheme infusion on multiple occasions most recent of which in May 2017.  Current therapy: IV iron in the form of Feraheme intermittently last treatment given in October 2017.  Interim History: Kathy Werner presents today for a follow-up visit. Since the last visit, she received 2 separate infusions of Feraheme and tolerated it well. She did report mild hives associated with her IV iron but resolved spontaneously. She feels well otherwise and have benefited clinically from the intravenous iron infusion. She reports more energy, improved exercise tolerance and no hematochezia or melena. She continues to have heavy menstrual cycles and follows up with gynecology regarding this issue.  She does not report any blurred vision or syncope. She did not report any seizures. She did not report any chest pain or palpitation but does report exertional dyspnea. She did not report any cough or wheezing. He did not report any nausea or vomiting but does report some occasional abdominal pain and dyspepsia related to the iron. She has not reported any frequency, urgency or hematuria. Did not report any skeletal complaints of arthralgias or myalgias.  Rest of the review of systems unremarkable.   Medications: I have reviewed the patient's current medications.  Current Outpatient Prescriptions  Medication Sig Dispense Refill  . acetaminophen (TYLENOL) 325 MG tablet Take 650 mg by mouth every 6 (six) hours as needed for moderate pain or headache.    Marland Kitchen HYDROcodone-acetaminophen (NORCO/VICODIN) 5-325 MG per tablet Take 1 tablet by mouth  every 6 (six) hours as needed for moderate pain. 30 tablet 0  . ibuprofen (ADVIL,MOTRIN) 600 MG tablet Ibuprofen 600 mg every 6 hours for 3 days, then every 6 hours as needed for pain 60 tablet 1  . VIENVA 0.1-20 MG-MCG tablet      No current facility-administered medications for this visit.      Allergies: No Known Allergies  Past Medical History, Surgical history, Social history, and Family History were reviewed and updated.  Physical Exam: Blood pressure (!) 112/56, pulse 62, temperature 98.3 F (36.8 C), temperature source Oral, resp. rate 18, height 5\' 3"  (1.6 m), weight 250 lb 9.6 oz (113.7 kg), SpO2 100 %. ECOG: 0 General appearance: alert and cooperative appeared without distress. Head: Normocephalic, without obvious abnormality no oral ulcers or lesions. Neck: no adenopathy Lymph nodes: Cervical, supraclavicular, and axillary nodes normal. Heart:regular rate and rhythm, S1, S2 normal, no murmur, click, rub or gallop Lung:chest clear, no wheezing, rales, normal symmetric air entry Abdomin: soft, non-tender, without masses or organomegaly no shifting dullness or ascites. EXT:no erythema, induration, or nodules   Lab Results: Lab Results  Component Value Date   WBC 7.3 10/20/2016   HGB 10.4 (L) 10/20/2016   HCT 33.2 (L) 10/20/2016   MCV 83.2 10/20/2016   PLT 260 10/20/2016     Chemistry      Component Value Date/Time   NA 140 01/22/2016 1940   NA 141 08/22/2014 1353   K 4.0 01/22/2016 1940   K 4.1 08/22/2014 1353   CL 107 01/22/2016 1940   CO2 22 01/22/2016 1940   CO2 25 08/22/2014 1353   BUN 12 01/22/2016  1940   BUN 10.6 08/22/2014 1353   CREATININE 0.98 01/22/2016 1940   CREATININE 1.0 08/22/2014 1353      Component Value Date/Time   CALCIUM 9.2 01/22/2016 1940   CALCIUM 9.0 08/22/2014 1353   ALKPHOS 54 02/17/2015 1035   ALKPHOS 83 08/22/2014 1353   AST 14 02/17/2015 1035   AST 19 08/22/2014 1353   ALT 13 02/17/2015 1035   ALT 16 08/22/2014 1353    BILITOT 0.5 02/17/2015 1035   BILITOT 0.57 08/22/2014 1353     Results for Kathy Werner, Kathy Werner (MRN [id number]) as of 10/20/2016 15:13  Ref. Range 09/02/2016 15:23  Iron Latest Ref Range: 41 - 142 ug/dL 14 (L)  UIBC Latest Ref Range: 120 - 384 ug/dL 443 (H)  TIBC Latest Ref Range: 236 - 444 ug/dL 456 (H)  %SAT Latest Ref Range: 21 - 57 % 3 (L)  Ferritin Latest Ref Range: 9 - 269 ng/ml 4 (L)     Impression and Plan:  40 year old woman with the following issues:  1. Iron deficiency anemia diagnosed in 2015 and has been recurrent because of menorrhagia. She is status post intravenous iron because of intolerance to oral iron with excellent response in the past.   Her iron studies in September 2017 showed major deficiency with a hemoglobin of 6.9. She received intravenous iron in October 2017 with major improvement in her hemoglobin up to 10.4. Her iron studies are currently pending today and we will repeat intravenous iron infusion as needed. I recommended very close follow-up every 2-3 months and replace IV iron as needed.  2. Menorrhagia related to uterine fibroids: She is following up with her OB/GYN regarding this issue.   3. Follow-up: Will be in 3 months to recheck her hemoglobin and iron studies.   [facility], MD 11/8/20173:21 PM

## 2016-10-20 NOTE — Telephone Encounter (Signed)
Gave patient avs report and appointments for February.  °

## 2016-10-21 ENCOUNTER — Telehealth: Payer: Self-pay | Admitting: *Deleted

## 2016-10-21 ENCOUNTER — Other Ambulatory Visit: Payer: Self-pay | Admitting: Oncology

## 2016-10-21 LAB — IRON AND TIBC
%SAT: 7 % — ABNORMAL LOW (ref 21–57)
Iron: 28 ug/dL — ABNORMAL LOW (ref 41–142)
TIBC: 398 ug/dL (ref 236–444)
UIBC: 369 ug/dL (ref 120–384)

## 2016-10-21 LAB — FERRITIN: Ferritin: 12 ng/ml (ref 9–269)

## 2016-10-21 NOTE — Telephone Encounter (Signed)
As noted below by Dr. Alen Blew, I informed patient of her iron levels. Informed her that she needs another iron infusion next week, and someone from scheduling will be calling her to schedule that appointment. Patient verbalized understanding.

## 2016-10-21 NOTE — Telephone Encounter (Signed)
-----   Message from Kathy Portela, MD sent at 10/21/2016  9:56 AM EST ----- Please let her know her iron is better but She needs another infusion next week. Orders are in

## 2016-10-25 ENCOUNTER — Telehealth: Payer: Self-pay | Admitting: *Deleted

## 2016-10-25 NOTE — Telephone Encounter (Signed)
Per LOS I have scheduled appts and notified the scheduler 

## 2016-10-29 ENCOUNTER — Ambulatory Visit (HOSPITAL_BASED_OUTPATIENT_CLINIC_OR_DEPARTMENT_OTHER): Payer: Managed Care, Other (non HMO)

## 2016-10-29 VITALS — BP 102/45 | HR 81 | Temp 98.7°F | Resp 18

## 2016-10-29 DIAGNOSIS — D509 Iron deficiency anemia, unspecified: Secondary | ICD-10-CM | POA: Diagnosis not present

## 2016-10-29 DIAGNOSIS — D508 Other iron deficiency anemias: Secondary | ICD-10-CM

## 2016-10-29 MED ORDER — SODIUM CHLORIDE 0.9 % IV SOLN
510.0000 mg | Freq: Once | INTRAVENOUS | Status: AC
  Administered 2016-10-29: 510 mg via INTRAVENOUS
  Filled 2016-10-29: qty 17

## 2016-10-29 MED ORDER — SODIUM CHLORIDE 0.9 % IV SOLN
Freq: Once | INTRAVENOUS | Status: AC
  Administered 2016-10-29: 15:00:00 via INTRAVENOUS

## 2016-10-29 NOTE — Patient Instructions (Signed)

## 2016-11-05 ENCOUNTER — Ambulatory Visit (HOSPITAL_BASED_OUTPATIENT_CLINIC_OR_DEPARTMENT_OTHER): Payer: Managed Care, Other (non HMO)

## 2016-11-05 VITALS — BP 119/64 | HR 75 | Temp 98.4°F | Resp 16

## 2016-11-05 DIAGNOSIS — D509 Iron deficiency anemia, unspecified: Secondary | ICD-10-CM

## 2016-11-05 DIAGNOSIS — D508 Other iron deficiency anemias: Secondary | ICD-10-CM

## 2016-11-05 MED ORDER — SODIUM CHLORIDE 0.9 % IV SOLN
Freq: Once | INTRAVENOUS | Status: AC
  Administered 2016-11-05: 15:00:00 via INTRAVENOUS

## 2016-11-05 MED ORDER — SODIUM CHLORIDE 0.9 % IV SOLN
510.0000 mg | Freq: Once | INTRAVENOUS | Status: AC
  Administered 2016-11-05: 510 mg via INTRAVENOUS
  Filled 2016-11-05: qty 17

## 2016-11-05 NOTE — Patient Instructions (Signed)
Ferumoxytol injection What is this medicine? FERUMOXYTOL is an iron complex. Iron is used to make healthy red blood cells, which carry oxygen and nutrients throughout the body. This medicine is used to treat iron deficiency anemia in people with chronic kidney disease. COMMON BRAND NAME(S): Feraheme What should I tell my health care provider before I take this medicine? They need to know if you have any of these conditions: -anemia not caused by low iron levels -high levels of iron in the blood -magnetic resonance imaging (MRI) test scheduled -an unusual or allergic reaction to iron, other medicines, foods, dyes, or preservatives -pregnant or trying to get pregnant -breast-feeding How should I use this medicine? This medicine is for injection into a vein. It is given by a health care professional in a hospital or clinic setting. Talk to your pediatrician regarding the use of this medicine in children. Special care may be needed. What if I miss a dose? It is important not to miss your dose. Call your doctor or health care professional if you are unable to keep an appointment. What may interact with this medicine? This medicine may interact with the following medications: -other iron products What should I watch for while using this medicine? Visit your doctor or healthcare professional regularly. Tell your doctor or healthcare professional if your symptoms do not start to get better or if they get worse. You may need blood work done while you are taking this medicine. You may need to follow a special diet. Talk to your doctor. Foods that contain iron include: whole grains/cereals, dried fruits, beans, or peas, leafy green vegetables, and organ meats (liver, kidney). What side effects may I notice from receiving this medicine? Side effects that you should report to your doctor or health care professional as soon as possible: -allergic reactions like skin rash, itching or hives, swelling of the  face, lips, or tongue -breathing problems -changes in blood pressure -feeling faint or lightheaded, falls -fever or chills -flushing, sweating, or hot feelings -swelling of the ankles or feet Side effects that usually do not require medical attention (report to your doctor or health care professional if they continue or are bothersome): -diarrhea -headache -nausea, vomiting -stomach pain Where should I keep my medicine? This drug is given in a hospital or clinic and will not be stored at home.  2017 Elsevier/Gold Standard (2016-01-01 12:41:49)  

## 2016-12-15 ENCOUNTER — Other Ambulatory Visit: Payer: Self-pay | Admitting: Obstetrics and Gynecology

## 2017-01-04 ENCOUNTER — Telehealth: Payer: Self-pay | Admitting: Oncology

## 2017-01-04 ENCOUNTER — Other Ambulatory Visit: Payer: Self-pay | Admitting: Oncology

## 2017-01-04 ENCOUNTER — Telehealth: Payer: Self-pay

## 2017-01-04 DIAGNOSIS — N924 Excessive bleeding in the premenopausal period: Secondary | ICD-10-CM

## 2017-01-04 NOTE — Telephone Encounter (Signed)
Pt called stating Dr Kendall Flack is going to be doing an ablation next Tuesday. Dr Leo Grosser wants pt to have an iron infusion before the ablation. Pt states her iron is currently 8. She is asking for an infusion appt.

## 2017-01-04 NOTE — Telephone Encounter (Signed)
Please let her know that she will be contacted with a lab and infusion appointment this week.

## 2017-01-04 NOTE — Telephone Encounter (Signed)
sw pt to confirm 12/6 appt date/time per LOS °

## 2017-01-06 ENCOUNTER — Encounter (HOSPITAL_COMMUNITY): Payer: Self-pay

## 2017-01-07 ENCOUNTER — Other Ambulatory Visit: Payer: Self-pay | Admitting: Obstetrics and Gynecology

## 2017-01-07 ENCOUNTER — Other Ambulatory Visit (HOSPITAL_BASED_OUTPATIENT_CLINIC_OR_DEPARTMENT_OTHER): Payer: Managed Care, Other (non HMO)

## 2017-01-07 ENCOUNTER — Ambulatory Visit (HOSPITAL_BASED_OUTPATIENT_CLINIC_OR_DEPARTMENT_OTHER): Payer: Managed Care, Other (non HMO)

## 2017-01-07 VITALS — BP 101/56 | HR 74 | Temp 98.6°F | Resp 18

## 2017-01-07 DIAGNOSIS — D509 Iron deficiency anemia, unspecified: Secondary | ICD-10-CM

## 2017-01-07 DIAGNOSIS — D508 Other iron deficiency anemias: Secondary | ICD-10-CM

## 2017-01-07 DIAGNOSIS — N924 Excessive bleeding in the premenopausal period: Secondary | ICD-10-CM

## 2017-01-07 LAB — CBC WITH DIFFERENTIAL/PLATELET
BASO%: 0.6 % (ref 0.0–2.0)
BASOS ABS: 0 10*3/uL (ref 0.0–0.1)
EOS%: 0.9 % (ref 0.0–7.0)
Eosinophils Absolute: 0.1 10*3/uL (ref 0.0–0.5)
HEMATOCRIT: 27.9 % — AB (ref 34.8–46.6)
HEMOGLOBIN: 9.1 g/dL — AB (ref 11.6–15.9)
LYMPH%: 39 % (ref 14.0–49.7)
MCH: 28 pg (ref 25.1–34.0)
MCHC: 32.5 g/dL (ref 31.5–36.0)
MCV: 86.2 fL (ref 79.5–101.0)
MONO#: 0.6 10*3/uL (ref 0.1–0.9)
MONO%: 7.2 % (ref 0.0–14.0)
NEUT#: 4.2 10*3/uL (ref 1.5–6.5)
NEUT%: 52.3 % (ref 38.4–76.8)
PLATELETS: 257 10*3/uL (ref 145–400)
RBC: 3.24 10*6/uL — ABNORMAL LOW (ref 3.70–5.45)
RDW: 17.6 % — AB (ref 11.2–14.5)
WBC: 8 10*3/uL (ref 3.9–10.3)
lymph#: 3.1 10*3/uL (ref 0.9–3.3)

## 2017-01-07 MED ORDER — SODIUM CHLORIDE 0.9 % IV SOLN
Freq: Once | INTRAVENOUS | Status: AC
  Administered 2017-01-07: 16:00:00 via INTRAVENOUS

## 2017-01-07 MED ORDER — SODIUM CHLORIDE 0.9 % IV SOLN
510.0000 mg | Freq: Once | INTRAVENOUS | Status: AC
  Administered 2017-01-07: 510 mg via INTRAVENOUS
  Filled 2017-01-07: qty 17

## 2017-01-10 ENCOUNTER — Encounter (HOSPITAL_COMMUNITY): Payer: Self-pay | Admitting: Obstetrics and Gynecology

## 2017-01-10 LAB — IRON AND TIBC
%SAT: 5 % — ABNORMAL LOW (ref 21–57)
Iron: 20 ug/dL — ABNORMAL LOW (ref 41–142)
TIBC: 400 ug/dL (ref 236–444)
UIBC: 380 ug/dL (ref 120–384)

## 2017-01-10 LAB — FERRITIN: Ferritin: 6 ng/ml — ABNORMAL LOW (ref 9–269)

## 2017-01-10 NOTE — H&P (Signed)
Kathy Werner is an 41 y.o. female presents for endometrial ablation for menorrhagia and resultant anemia.  The pt has fibroids and underwent a hysteroscopic myomectomy.  This did not improve her menorrhagia.  The pt had used a Mirena IUD in 2014, but this was expelled.  She has been using birth control pills for menorrhagia for about 6 mos without improvement.  The patient is chronically anemic and has required several iron infusions for management of her anemia.  She declines hysterectomy, so requests endometrial ablation in an attempt to improve her bleeding  Pertinent Gynecological History: Menses: flow is excessive with use of 12 pads or tampons on heaviest days and irregular occurring approximately every few days days with spotting approximately every few  days per month Bleeding: intermenstrual bleeding Contraception: OCP (estrogen/progesterone) DES exposure: unknown Blood transfusions: none  Has had a number of iron infusions Sexually transmitted diseases: no past history Previous GYN Procedures: DNC  Last mammogram: n/a Date: n/a Last pap: normal Date: 2017 OB History: G3, P2   Menstrual History: Menarche age14 Patient's last menstrual period was 12/02/2016 (exact date).    Past Medical History:  Diagnosis Date  . Anemia    receiving iron infusion 01/07/2017  . Fibroid   . Menorrhagia 03/2014  . Migraines   . Vaginal Pap smear, abnormal    now nl since [id number]    Past Surgical History:  Procedure Laterality Date  . DILATATION & CURETTAGE/HYSTEROSCOPY WITH MYOSURE N/A 02/17/2015   Procedure: DILATATION & CURETTAGE/HYSTEROSCOPY WITH MYOSURE;  Surgeon: Eldred Manges, MD;  Location: [facility];  Service: Gynecology;  Laterality: N/A;  . IUD REMOVAL  06/2013   due to worsening dysmenorrhea  . MIRENA     Inserted 01-24-13  . WISDOM TOOTH EXTRACTION      Family History  Problem Relation Age of Onset  . Hypertension Paternal Grandmother   . Sudden death Neg Hx   .  Hyperlipidemia Neg Hx   . Heart attack Neg Hx   . Diabetes Neg Hx     Social History:  reports that she has never smoked. She has never used smokeless tobacco. She reports that she drinks alcohol. She reports that she does not use drugs.  Allergies: No Known Allergies  No prescriptions prior to admission.    Review of Systems  Constitutional: Negative.   HENT: Negative.   Eyes: Negative.   Respiratory: Negative.   Cardiovascular: Negative.   Gastrointestinal: Negative.   Genitourinary: Negative.   Musculoskeletal: Negative.   Skin: Negative.   Neurological: Negative.   Endo/Heme/Allergies: Negative.   Psychiatric/Behavioral: Negative.     Last menstrual period 12/02/2016. Physical Exam  Constitutional:  Obese black female  HENT:  Head: Normocephalic and atraumatic.  Eyes: EOM are normal.  Neck: Normal range of motion. Neck supple.  Cardiovascular: Normal rate and regular rhythm.   Respiratory: Effort normal and breath sounds normal.  GI: Soft. Bowel sounds are normal. She exhibits mass.  Suprapubic mass, nontender extending to [id number] below umbilicus  Genitourinary:  Genitourinary Comments: Pelvic exam:  VULVA: normal appearing vulva with no masses, tenderness or lesions,  VAGINA: normal appearing vagina with normal color and discharge, no lesions,  CERVIX: normal appearing cervix without discharge or lesions,  UTERUS: enlarged to 14-16 week's size,  ADNEXA: normal adnexa in size, nontender and no masses, no separable masses,  RECTAL: normal rectal, no masses, rectovaginal exam confirms pelvic findings.   ENDOMETRIAL BIOPSY:  12/2016= NL  No results found for this or  any previous visit (from the past 24 hour(s)).  No results found.  Assessment/Plan: Fibroids, s/p resection of 4 cm intracavitary fibroid 2016  Menorrhagia refractory to BCPs, Mirena and fibroid resection  Recommendation:   Endometrial ablation with HTA offered and accepted.  There may be the need  for resection of intracavitary fibroid at the same procedure.  The risks of anesthesia, bleeding, infection and damage to adjacent organs are reviewed.  The peculiar risk of uterine perforation is reviewed and the patient understands that if this occurs, the procedure will be discontinued. She also has been told that endometrial ablation improves bleeding in about 85% of patients, but that her chance of improvement may be less because of her fibroids.   She acknowledges understanding and wishes to proceed.   Ronson Hagins P 01/10/2017, 9:04 PM

## 2017-01-11 ENCOUNTER — Encounter (HOSPITAL_COMMUNITY)
Admission: RE | Disposition: A | Discharge status: Home / Self Care | Payer: Self-pay | Source: Ambulatory Visit | Attending: Obstetrics and Gynecology

## 2017-01-11 ENCOUNTER — Ambulatory Visit (HOSPITAL_COMMUNITY): Payer: Managed Care, Other (non HMO) | Admitting: Anesthesiology

## 2017-01-11 ENCOUNTER — Encounter (HOSPITAL_COMMUNITY): Payer: Self-pay

## 2017-01-11 ENCOUNTER — Ambulatory Visit (HOSPITAL_COMMUNITY)
Admission: RE | Admit: 2017-01-11 | Discharge: 2017-01-11 | Disposition: A | Discharge status: Home / Self Care | Payer: Managed Care, Other (non HMO) | Source: Ambulatory Visit | Attending: Obstetrics and Gynecology | Admitting: Obstetrics and Gynecology

## 2017-01-11 DIAGNOSIS — N920 Excessive and frequent menstruation with regular cycle: Secondary | ICD-10-CM | POA: Insufficient documentation

## 2017-01-11 DIAGNOSIS — Z793 Long term (current) use of hormonal contraceptives: Secondary | ICD-10-CM | POA: Diagnosis not present

## 2017-01-11 DIAGNOSIS — D259 Leiomyoma of uterus, unspecified: Secondary | ICD-10-CM | POA: Diagnosis present

## 2017-01-11 HISTORY — PX: DILITATION & CURRETTAGE/HYSTROSCOPY WITH HYDROTHERMAL ABLATION: SHX5570

## 2017-01-11 HISTORY — PX: DILATATION & CURETTAGE/HYSTEROSCOPY WITH MYOSURE: SHX6511

## 2017-01-11 HISTORY — DX: Unspecified abnormal cytological findings in specimens from vagina: R87.629

## 2017-01-11 LAB — TYPE AND SCREEN
ABO/RH(D): AB POS
ANTIBODY SCREEN: NEGATIVE

## 2017-01-11 LAB — PREGNANCY, URINE: PREG TEST UR: NEGATIVE

## 2017-01-11 SURGERY — DILATATION & CURETTAGE/HYSTEROSCOPY WITH HYDROTHERMAL ABLATION
Anesthesia: General | Site: Uterus

## 2017-01-11 MED ORDER — KETOROLAC TROMETHAMINE 30 MG/ML IJ SOLN
INTRAMUSCULAR | Status: DC | PRN
  Administered 2017-01-11: 30 mg via INTRAVENOUS
  Administered 2017-01-11: 30 mg via INTRAMUSCULAR

## 2017-01-11 MED ORDER — LIDOCAINE HCL 2 % IJ SOLN
INTRAMUSCULAR | Status: DC | PRN
  Administered 2017-01-11: 10 mL

## 2017-01-11 MED ORDER — KETOROLAC TROMETHAMINE 30 MG/ML IJ SOLN
INTRAMUSCULAR | Status: AC
  Filled 2017-01-11: qty 2

## 2017-01-11 MED ORDER — DEXAMETHASONE SODIUM PHOSPHATE 4 MG/ML IJ SOLN
INTRAMUSCULAR | Status: AC
  Filled 2017-01-11: qty 1

## 2017-01-11 MED ORDER — HYDROCODONE-ACETAMINOPHEN 5-325 MG PO TABS
1.0000 | ORAL_TABLET | Freq: Once | ORAL | Status: AC | PRN
  Administered 2017-01-11: 1 via ORAL

## 2017-01-11 MED ORDER — PROPOFOL 10 MG/ML IV BOLUS
INTRAVENOUS | Status: AC
  Filled 2017-01-11: qty 20

## 2017-01-11 MED ORDER — IBUPROFEN 600 MG PO TABS: ORAL_TABLET | ORAL | 0 refills | Status: DC

## 2017-01-11 MED ORDER — SCOPOLAMINE 1 MG/3DAYS TD PT72
1.0000 | MEDICATED_PATCH | Freq: Once | TRANSDERMAL | Status: DC
  Administered 2017-01-11: 1.5 mg via TRANSDERMAL

## 2017-01-11 MED ORDER — FENTANYL CITRATE (PF) 100 MCG/2ML IJ SOLN
INTRAMUSCULAR | Status: AC
  Filled 2017-01-11: qty 2

## 2017-01-11 MED ORDER — LIDOCAINE HCL (CARDIAC) 20 MG/ML IV SOLN
INTRAVENOUS | Status: AC
  Filled 2017-01-11: qty 5

## 2017-01-11 MED ORDER — FENTANYL CITRATE (PF) 100 MCG/2ML IJ SOLN
INTRAMUSCULAR | Status: DC | PRN
  Administered 2017-01-11: 50 ug via INTRAVENOUS
  Administered 2017-01-11: 100 ug via INTRAVENOUS
  Administered 2017-01-11 (×3): 50 ug via INTRAVENOUS

## 2017-01-11 MED ORDER — DEXAMETHASONE SODIUM PHOSPHATE 10 MG/ML IJ SOLN
INTRAMUSCULAR | Status: DC | PRN
  Administered 2017-01-11: 4 mg via INTRAVENOUS

## 2017-01-11 MED ORDER — LACTATED RINGERS IV SOLN
INTRAVENOUS | Status: DC
  Administered 2017-01-11: 12:00:00 via INTRAVENOUS
  Administered 2017-01-11: 125 mL/h via INTRAVENOUS

## 2017-01-11 MED ORDER — HYDROCODONE-ACETAMINOPHEN 5-325MG PREPACK (~~LOC~~: ORAL_TABLET | ORAL | 0 refills | Status: DC

## 2017-01-11 MED ORDER — HYDROMORPHONE HCL 1 MG/ML IJ SOLN
INTRAMUSCULAR | Status: DC
Start: 2017-01-11 — End: 2017-01-11
  Filled 2017-01-11: qty 1

## 2017-01-11 MED ORDER — MIDAZOLAM HCL 2 MG/2ML IJ SOLN
INTRAMUSCULAR | Status: AC
  Filled 2017-01-11: qty 2

## 2017-01-11 MED ORDER — SODIUM CHLORIDE 0.9 % IR SOLN
Status: DC | PRN
  Administered 2017-01-11 (×5): 3000 mL

## 2017-01-11 MED ORDER — VASOPRESSIN 20 UNIT/ML IV SOLN
INTRAVENOUS | Status: AC
  Filled 2017-01-11: qty 1

## 2017-01-11 MED ORDER — VASOPRESSIN 20 UNIT/ML IV SOLN
INTRAVENOUS | Status: DC | PRN
  Administered 2017-01-11: 10 mL via INTRAMUSCULAR

## 2017-01-11 MED ORDER — HYDROMORPHONE HCL 1 MG/ML IJ SOLN
0.2500 mg | INTRAMUSCULAR | Status: DC | PRN
  Administered 2017-01-11: 0.5 mg via INTRAVENOUS
  Administered 2017-01-11 (×2): 0.25 mg via INTRAVENOUS

## 2017-01-11 MED ORDER — LIDOCAINE HCL (CARDIAC) 20 MG/ML IV SOLN
INTRAVENOUS | Status: DC | PRN
  Administered 2017-01-11: 50 mg via INTRAVENOUS
  Administered 2017-01-11: 100 mg via INTRAVENOUS

## 2017-01-11 MED ORDER — LIDOCAINE HCL 2 % IJ SOLN
INTRAMUSCULAR | Status: AC
  Filled 2017-01-11: qty 20

## 2017-01-11 MED ORDER — HYDROMORPHONE HCL 1 MG/ML IJ SOLN
INTRAMUSCULAR | Status: AC
  Administered 2017-01-11: 0.25 mg via INTRAVENOUS
  Filled 2017-01-11: qty 1

## 2017-01-11 MED ORDER — PROPOFOL 10 MG/ML IV BOLUS
INTRAVENOUS | Status: DC | PRN
  Administered 2017-01-11: 150 mg via INTRAVENOUS
  Administered 2017-01-11: 50 mg via INTRAVENOUS

## 2017-01-11 MED ORDER — MIDAZOLAM HCL 2 MG/2ML IJ SOLN
INTRAMUSCULAR | Status: DC | PRN
  Administered 2017-01-11: 2 mg via INTRAVENOUS

## 2017-01-11 MED ORDER — ONDANSETRON HCL 4 MG/2ML IJ SOLN
INTRAMUSCULAR | Status: AC
  Filled 2017-01-11: qty 2

## 2017-01-11 MED ORDER — PHENYLEPHRINE 40 MCG/ML (10ML) SYRINGE FOR IV PUSH (FOR BLOOD PRESSURE SUPPORT)
PREFILLED_SYRINGE | INTRAVENOUS | Status: AC
  Filled 2017-01-11: qty 10

## 2017-01-11 MED ORDER — HYDROCODONE-ACETAMINOPHEN 5-325 MG PO TABS
ORAL_TABLET | ORAL | Status: AC
  Administered 2017-01-11: 1 via ORAL
  Filled 2017-01-11: qty 1

## 2017-01-11 MED ORDER — SODIUM CHLORIDE 0.9 % IV SOLN
INTRAVENOUS | Status: DC | PRN
  Administered 2017-01-11 (×2): 80 ug via INTRAVENOUS

## 2017-01-11 MED ORDER — ONDANSETRON HCL 4 MG/2ML IJ SOLN
INTRAMUSCULAR | Status: DC | PRN
  Administered 2017-01-11: 4 mg via INTRAVENOUS

## 2017-01-11 MED ORDER — SODIUM CHLORIDE 0.9 % IJ SOLN
INTRAMUSCULAR | Status: AC
  Filled 2017-01-11: qty 100

## 2017-01-11 MED ORDER — SCOPOLAMINE 1 MG/3DAYS TD PT72
MEDICATED_PATCH | TRANSDERMAL | Status: AC
  Administered 2017-01-11: 1.5 mg via TRANSDERMAL
  Filled 2017-01-11: qty 1

## 2017-01-11 SURGICAL SUPPLY — 27 items
BIPOLAR CUTTING LOOP 21FR (ELECTRODE) ×4
BOOTIES KNEE HIGH SLOAN (MISCELLANEOUS) ×6 IMPLANT
CANISTER SUCT 3000ML (MISCELLANEOUS) ×3 IMPLANT
CATH ROBINSON RED A/P 16FR (CATHETERS) ×3 IMPLANT
CLOTH BEACON ORANGE TIMEOUT ST (SAFETY) ×3 IMPLANT
CONTAINER PREFILL 10% NBF 60ML (FORM) ×6 IMPLANT
DEVICE MYOSURE LITE (MISCELLANEOUS) IMPLANT
DEVICE MYOSURE REACH (MISCELLANEOUS) IMPLANT
DILATOR CANAL MILEX (MISCELLANEOUS) IMPLANT
ELECT REM PT RETURN 9FT ADLT (ELECTROSURGICAL) ×3
ELECTRODE REM PT RTRN 9FT ADLT (ELECTROSURGICAL) ×1 IMPLANT
FILTER ARTHROSCOPY CONVERTOR (FILTER) ×3 IMPLANT
GLOVE BIOGEL PI IND STRL 7.0 (GLOVE) ×1 IMPLANT
GLOVE BIOGEL PI INDICATOR 7.0 (GLOVE) ×2
GLOVE SURG SS PI 6.5 STRL IVOR (GLOVE) ×6 IMPLANT
GOWN STRL REUS W/TWL LRG LVL3 (GOWN DISPOSABLE) ×6 IMPLANT
LOOP CUTTING BIPOLAR 21FR (ELECTRODE) ×2 IMPLANT
MYOSURE XL FIBROID REM (MISCELLANEOUS) ×6
PACK VAGINAL MINOR WOMEN LF (CUSTOM PROCEDURE TRAY) ×3 IMPLANT
PAD OB MATERNITY 4.3X12.25 (PERSONAL CARE ITEMS) ×3 IMPLANT
SEAL ROD LENS SCOPE MYOSURE (ABLATOR) ×3 IMPLANT
SET GENESYS HTA PROCERVA (MISCELLANEOUS) ×3 IMPLANT
SYSTEM TISS REMOVAL MYSR XL RM (MISCELLANEOUS) ×2 IMPLANT
TOWEL OR 17X24 6PK STRL BLUE (TOWEL DISPOSABLE) ×6 IMPLANT
TUBING AQUILEX INFLOW (TUBING) ×3 IMPLANT
TUBING AQUILEX OUTFLOW (TUBING) ×3 IMPLANT
WATER STERILE IRR 1000ML POUR (IV SOLUTION) ×3 IMPLANT

## 2017-01-11 NOTE — Op Note (Signed)
01/11/2017  11:42 AM  PATIENT:  Kathy Werner  41 y.o. female  PRE-OPERATIVE DIAGNOSIS:  Menorrhagia and fibroids   POST-OPERATIVE DIAGNOSIS:  MENORRHAGIA,FIBROIDS  PROCEDURE:  Procedure(s): DILATATION & CURETTAGE/HYSTEROSCOPY WITH HYDROTHERMAL ABLATION DILATATION & CURETTAGE/HYSTEROSCOPY WITH MYOSURE, 2.9 RESECTION OF  FIBROID  SURGEON:  Fahim Kats P, MD  ASSISTANTS: none ANESTHESIA:   local and general  ESTIMATED BLOOD LOSS:minimal BLOOD ADMINISTERED:none  COMPLICATIONS: None  FINDINGS: The uterus sounded to 13 cm. There was an intracavitary fibroid measuring approximately 4 cm that was essentially totally intracavitary emanating from the right uterine sidewall. The left tubal ostium was easily identified.  FLUID DEFICIT:2310cc  LOCAL MEDICATIONS USED:  LIDOCAINE  and Amount: 10 ml  SPECIMEN:  Source of Specimen:  Uterine fibroid  DISPOSITION OF SPECIMEN:  PATHOLOGY  COUNTS:  YES  DESCRIPTION OF PROCEDURE:the patient was taken to the operating room after appropriate identification and placed on the operating table. After the attainment of adequate general anesthesia she was placed in the lithotomy position. Timeout was performed The perineum and vagina were prepped with multiple layers of Betadine. The bladder was emptied with a an in and out catheter. The perineum was draped in sterile field. A gray speculum was placed in the vagina. The cervix was grasped with a single-tooth tenaculum. A paracervical block was achieved with a total of 10 cc of 2% Xylocaine and the 5 and 7:00 positions. The uterus was sounded.   The cervix was then dilated to accommodate the diagnostic hysteroscope. The hysteroscope was used to evaluate all quadrants of the uterus with the above-noted finding. A decision was made to resect the uterine fibroid using the Myosure. The Myosure hysteroscope was assembled and introduced into the uterine cavity. The operative minor sure instrument was then  inserted. This was all performed after injection of dilute Pitressin at the 4 and 8:00 position, for total of 10 cc. An attempt was made to resect the fibroid with the minor sure, however, no significant advancement and resection was made prior to achieving a deficit of around 1700 cc. At that point, the decision was made to move to the bipolar resectoscope. The minor sure apparatus was removed from the uterine cavity and the operative field and the bipolar resectoscope introduced. The uterine fibroid was successively resected to approximately one quarter its original size before the maximum deficit for resection was reached. At that time, the resection was terminated and the preparation for endometrial ablation made. The resectoscopic hysteroscope was removed and the apparatus for hydrothermal ablation placed through the cervical os beyond the internal os. Visualization was adequate and wants the cavity seal was determined, the saline warming process was started. Once this was completed, an ablation cycle of 10 minutes was undertaken with Toradol having been given at the initiation of the ablation cycle. After completion of the 10 minute ablation cycle cooldown cycle was completed.  All instruments were then removed from the vagina and the patient was awakened from general anesthesia and taken to the recovery room in satisfactory condition having tolerated the procedure well sponge and instrument counts correct.  PLAN OF CARE:  Discharge home after postanesthesia care  PATIENT DISPOSITION:  PACU - hemodynamically stable.   Delay start of Pharmacological VTE agent (>24hrs) due to surgical blood loss or risk of bleeding:  Yes.  SCDs were used throughout the entire procedure   Eldred Manges, MD 11:42 AM

## 2017-01-11 NOTE — Anesthesia Procedure Notes (Signed)
Procedure Name: LMA Insertion Date/Time: 01/11/2017 8:45 AM Performed by: Tayvia Faughnan, Sheron Nightingale Pre-anesthesia Checklist: Patient identified, Emergency Drugs available, Suction available, Patient being monitored and Timeout performed Patient Re-evaluated:Patient Re-evaluated prior to inductionOxygen Delivery Method: Circle system utilized Preoxygenation: Pre-oxygenation with 100% oxygen Intubation Type: IV induction Ventilation: Mask ventilation without difficulty LMA Size: 4.0 Number of attempts: 1 ETT to lip (cm): secured at lips. Dental Injury: Teeth and Oropharynx as per pre-operative assessment

## 2017-01-11 NOTE — OR Nursing (Signed)
SPOKE WITH FAMILY PER DR.HAYGOOD AND GAVE AN UPDATE.

## 2017-01-11 NOTE — Discharge Instructions (Signed)
DISCHARGE INSTRUCTIONS: D&C / HYSTEROSCOPY /HYDROTHERMAL ENDOMETRIAL ABLATION / FIBROID RESECTION The following instructions have been prepared to help you care for yourself upon your return home.  May Remove Scop patch on or before  **May take Ibuprofen after 4:30 PM TODAY**   Personal hygiene:  Use sanitary pads for vaginal drainage, not tampons.  Shower the day after your procedure.  NO tub baths, pools or Jacuzzis for 2-3 weeks.  Wipe front to back after using the bathroom.  Activity and limitations:  Do NOT drive or operate any equipment for 24 hours. The effects of anesthesia are still present and drowsiness may result.  Do NOT rest in bed all day.  Walking is encouraged.  Walk up and down stairs slowly.  You may resume your normal activity in one to two days or as indicated by your physician. Sexual activity: NO intercourse for at least 2 weeks after the procedure, or as indicated by your Doctor.  Diet: Eat a light meal as desired this evening. You may resume your usual diet tomorrow.  Return to Work: You may resume your work activities in one to two days or as indicated by Marine scientist.  What to expect after your surgery: Expect to have vaginal bleeding/discharge for 2-3 days and spotting for up to 10 days. It is not unusual to have soreness for up to 1-2 weeks. You may have a slight burning sensation when you urinate for the first day. Mild cramps may continue for a couple of days. You may have a regular period in 2-6 weeks.  Call your doctor for any of the following:  Excessive vaginal bleeding or clotting, saturating and changing one pad every hour.  Inability to urinate 6 hours after discharge from hospital.  Pain not relieved by pain medication.  Fever of 100.4 F or greater.  Unusual vaginal discharge or odor.   Patients signature: ______________________  Nurses signature ________________________  Support person's  signature_______________________

## 2017-01-11 NOTE — Anesthesia Postprocedure Evaluation (Signed)
Anesthesia Post Note  Patient: Kathy Werner  Procedure(s) Performed: Procedure(s) (LRB): DILATATION & CURETTAGE/HYSTEROSCOPY WITH HYDROTHERMAL ABLATION (N/A) DILATATION & CURETTAGE/HYSTEROSCOPY WITH MYOSURE, 2.9 RESECTION OF  FIBROID (N/A)  Patient location during evaluation: PACU Anesthesia Type: General Level of consciousness: awake and alert and oriented Pain management: pain level controlled Vital Signs Assessment: post-procedure vital signs reviewed and stable Respiratory status: spontaneous breathing, nonlabored ventilation and respiratory function stable Cardiovascular status: blood pressure returned to baseline and stable Postop Assessment: no signs of nausea or vomiting Anesthetic complications: no        Last Vitals:  Vitals:   01/11/17 1145 01/11/17 1200  BP: (!) 102/54 109/60  Pulse: 78 74  Resp: 20 19  Temp:  37 C    Last Pain:  Vitals:   01/11/17 1200  TempSrc:   PainSc: 5    Pain Goal: Patients Stated Pain Goal: 4 (01/11/17 0755)               Alphus Zeck A.

## 2017-01-11 NOTE — Transfer of Care (Signed)
Immediate Anesthesia Transfer of Care Note  Patient: Kathy Werner  Procedure(s) Performed: Procedure(s) with comments: DILATATION & CURETTAGE/HYSTEROSCOPY WITH HYDROTHERMAL ABLATION (N/A) - Dr. Leo Grosser is asking that the HTA be completed before the resection  [place] HTA rep will be here  DILATATION & CURETTAGE/HYSTEROSCOPY WITH MYOSURE, 2.9 RESECTION OF  FIBROID (N/A)  Patient Location: PACU  Anesthesia Type:General  Level of Consciousness: awake, alert  and oriented  Airway & Oxygen Therapy: Patient Spontanous Breathing  Post-op Assessment: Report given to RN and Post -op Vital signs reviewed and stable  Post vital signs: Reviewed and stable  Last Vitals:  Vitals:   01/11/17 0755  BP: 119/74  Pulse: 82  Resp: 16  Temp: 37.1 C    Last Pain:  Vitals:   01/11/17 0755  TempSrc: Oral      Patients Stated Pain Goal: 4 ([id number])  Complications: No apparent anesthesia complications

## 2017-01-11 NOTE — Anesthesia Preprocedure Evaluation (Signed)
Anesthesia Evaluation  Patient identified by MRN, date of birth, ID band Patient awake    Reviewed: Allergy & Precautions, NPO status , Patient's Chart, lab work & pertinent test results  Airway Mallampati: II  TM Distance: >3 FB     Dental   Pulmonary neg pulmonary ROS,    breath sounds clear to auscultation       Cardiovascular negative cardio ROS   Rhythm:Regular Rate:Normal     Neuro/Psych  Headaches,    GI/Hepatic negative GI ROS, Neg liver ROS,   Endo/Other  negative endocrine ROS  Renal/GU negative Renal ROS     Musculoskeletal   Abdominal   Peds  Hematology  (+) anemia ,   Anesthesia Other Findings   Reproductive/Obstetrics                             Anesthesia Physical Anesthesia Plan  ASA: II  Anesthesia Plan: General   Post-op Pain Management:    Induction: Intravenous  Airway Management Planned: LMA  Additional Equipment:   Intra-op Plan:   Post-operative Plan: Extubation in OR  Informed Consent:   Dental advisory given  Plan Discussed with: CRNA and Anesthesiologist  Anesthesia Plan Comments:         Anesthesia Quick Evaluation

## 2017-01-13 ENCOUNTER — Encounter: Payer: Self-pay | Admitting: *Deleted

## 2017-01-13 ENCOUNTER — Other Ambulatory Visit: Payer: Self-pay | Admitting: *Deleted

## 2017-01-14 ENCOUNTER — Ambulatory Visit (HOSPITAL_BASED_OUTPATIENT_CLINIC_OR_DEPARTMENT_OTHER): Payer: Managed Care, Other (non HMO)

## 2017-01-14 ENCOUNTER — Other Ambulatory Visit: Payer: Managed Care, Other (non HMO)

## 2017-01-14 ENCOUNTER — Encounter (HOSPITAL_COMMUNITY): Payer: Self-pay | Admitting: Obstetrics and Gynecology

## 2017-01-14 ENCOUNTER — Telehealth: Payer: Self-pay | Admitting: *Deleted

## 2017-01-14 VITALS — BP 106/56 | HR 69 | Temp 98.5°F | Resp 18

## 2017-01-14 DIAGNOSIS — D509 Iron deficiency anemia, unspecified: Secondary | ICD-10-CM

## 2017-01-14 DIAGNOSIS — D508 Other iron deficiency anemias: Secondary | ICD-10-CM

## 2017-01-14 MED ORDER — SODIUM CHLORIDE 0.9 % IV SOLN
Freq: Once | INTRAVENOUS | Status: AC
  Administered 2017-01-14: 12:00:00 via INTRAVENOUS

## 2017-01-14 MED ORDER — SODIUM CHLORIDE 0.9 % IV SOLN
510.0000 mg | Freq: Once | INTRAVENOUS | Status: AC
  Administered 2017-01-14: 510 mg via INTRAVENOUS
  Filled 2017-01-14: qty 17

## 2017-01-14 NOTE — Telephone Encounter (Signed)
Spoke with patient, she will keep appt for next week for lab and dr Alen Blew 01/19/17

## 2017-01-14 NOTE — Patient Instructions (Signed)
Ferumoxytol injection What is this medicine? FERUMOXYTOL is an iron complex. Iron is used to make healthy red blood cells, which carry oxygen and nutrients throughout the body. This medicine is used to treat iron deficiency anemia in people with chronic kidney disease. COMMON BRAND NAME(S): Feraheme What should I tell my health care provider before I take this medicine? They need to know if you have any of these conditions: -anemia not caused by low iron levels -high levels of iron in the blood -magnetic resonance imaging (MRI) test scheduled -an unusual or allergic reaction to iron, other medicines, foods, dyes, or preservatives -pregnant or trying to get pregnant -breast-feeding How should I use this medicine? This medicine is for injection into a vein. It is given by a health care professional in a hospital or clinic setting. Talk to your pediatrician regarding the use of this medicine in children. Special care may be needed. What if I miss a dose? It is important not to miss your dose. Call your doctor or health care professional if you are unable to keep an appointment. What may interact with this medicine? This medicine may interact with the following medications: -other iron products What should I watch for while using this medicine? Visit your doctor or healthcare professional regularly. Tell your doctor or healthcare professional if your symptoms do not start to get better or if they get worse. You may need blood work done while you are taking this medicine. You may need to follow a special diet. Talk to your doctor. Foods that contain iron include: whole grains/cereals, dried fruits, beans, or peas, leafy green vegetables, and organ meats (liver, kidney). What side effects may I notice from receiving this medicine? Side effects that you should report to your doctor or health care professional as soon as possible: -allergic reactions like skin rash, itching or hives, swelling of the  face, lips, or tongue -breathing problems -changes in blood pressure -feeling faint or lightheaded, falls -fever or chills -flushing, sweating, or hot feelings -swelling of the ankles or feet Side effects that usually do not require medical attention (report to your doctor or health care professional if they continue or are bothersome): -diarrhea -headache -nausea, vomiting -stomach pain Where should I keep my medicine? This drug is given in a hospital or clinic and will not be stored at home.  2017 Elsevier/Gold Standard (2016-01-01 12:41:49)  

## 2017-01-14 NOTE — Telephone Encounter (Signed)
-----   Message from Wyatt Portela, MD sent at 01/14/2017  1:24 PM EST ----- Regarding: RE: Upcoming Appts Contact: [phone] She needs only two total infusions. She had one and will get the second next week.  ----- Message ----- From: Wilmon Arms, RN Sent: 01/14/2017  12:09 PM To: Randolm Idol, RN, Wyatt Portela, MD Subject: Upcoming Appts                                 Office Visit note from 10/20/2016 states Iron infusion as needed recheck every 2-3 months.  Patient received IRON 10/29/16 & 11/07/2016 And 01/07/2017 & 01/14/2017  Had to receive last 2 Irons earlier since she had a uterine abalation and MD wanted her to have higher counts before procedure.  Does she need to have additional IRON on 01/19/2017 & 01/26/2017  Please notify patient if appointments are canceled as she thinks she does not need these appts.  Thanks Maudie Mercury

## 2017-01-17 ENCOUNTER — Telehealth: Payer: Self-pay | Admitting: Oncology

## 2017-01-17 NOTE — Telephone Encounter (Signed)
Pt called to r/s upcoming appts and cxl iv iron appts due to work sch. Gave pt new appt date/time 3/2 at 330 plm

## 2017-01-19 ENCOUNTER — Ambulatory Visit: Payer: Managed Care, Other (non HMO)

## 2017-01-19 ENCOUNTER — Ambulatory Visit: Payer: Managed Care, Other (non HMO) | Admitting: Oncology

## 2017-01-19 ENCOUNTER — Other Ambulatory Visit: Payer: Managed Care, Other (non HMO)

## 2017-01-26 ENCOUNTER — Ambulatory Visit: Payer: Managed Care, Other (non HMO)

## 2017-02-07 DIAGNOSIS — Z3009 Encounter for other general counseling and advice on contraception: Secondary | ICD-10-CM | POA: Insufficient documentation

## 2017-02-11 ENCOUNTER — Ambulatory Visit (HOSPITAL_BASED_OUTPATIENT_CLINIC_OR_DEPARTMENT_OTHER): Payer: 59 | Admitting: Oncology

## 2017-02-11 ENCOUNTER — Other Ambulatory Visit (HOSPITAL_BASED_OUTPATIENT_CLINIC_OR_DEPARTMENT_OTHER): Payer: 59

## 2017-02-11 ENCOUNTER — Telehealth: Payer: Self-pay | Admitting: Oncology

## 2017-02-11 VITALS — BP 113/57 | HR 77 | Temp 98.2°F | Resp 20 | Ht 63.0 in | Wt 262.3 lb

## 2017-02-11 DIAGNOSIS — D509 Iron deficiency anemia, unspecified: Secondary | ICD-10-CM | POA: Diagnosis not present

## 2017-02-11 DIAGNOSIS — N924 Excessive bleeding in the premenopausal period: Secondary | ICD-10-CM

## 2017-02-11 DIAGNOSIS — D259 Leiomyoma of uterus, unspecified: Secondary | ICD-10-CM

## 2017-02-11 DIAGNOSIS — N920 Excessive and frequent menstruation with regular cycle: Secondary | ICD-10-CM | POA: Diagnosis not present

## 2017-02-11 LAB — CBC WITH DIFFERENTIAL/PLATELET
BASO%: 0.5 % (ref 0.0–2.0)
Basophils Absolute: 0 10*3/uL (ref 0.0–0.1)
EOS ABS: 0.1 10*3/uL (ref 0.0–0.5)
EOS%: 1.5 % (ref 0.0–7.0)
HCT: 34.8 % (ref 34.8–46.6)
HGB: 11.5 g/dL — ABNORMAL LOW (ref 11.6–15.9)
LYMPH%: 35.5 % (ref 14.0–49.7)
MCH: 30 pg (ref 25.1–34.0)
MCHC: 33.2 g/dL (ref 31.5–36.0)
MCV: 90.3 fL (ref 79.5–101.0)
MONO#: 0.6 10*3/uL (ref 0.1–0.9)
MONO%: 8.1 % (ref 0.0–14.0)
NEUT%: 54.4 % (ref 38.4–76.8)
NEUTROS ABS: 4.2 10*3/uL (ref 1.5–6.5)
Platelets: 182 10*3/uL (ref 145–400)
RBC: 3.85 10*6/uL (ref 3.70–5.45)
RDW: 18.9 % — ABNORMAL HIGH (ref 11.2–14.5)
WBC: 7.6 10*3/uL (ref 3.9–10.3)
lymph#: 2.7 10*3/uL (ref 0.9–3.3)

## 2017-02-11 NOTE — Telephone Encounter (Signed)
Gave Patient AVS and Calender per 02/11/2017 los

## 2017-02-11 NOTE — Progress Notes (Signed)
Hematology and Oncology Follow Up Visit  Kathy Werner [id number] 16-May-1976 41 y.o. 02/11/2017 4:22 PM [facility] Danelle Earthly, MD   Principle Diagnosis: 41 year old woman with iron deficiency anemia related to menstrual blood losses dating back to 2015. She presented with a hemoglobin of 7.4, MCV of 67 and ferritin of less than 7.   Prior Therapy: She is status post Feraheme infusion on multiple occasions.  Current therapy: IV iron in the form of Feraheme intermittently last treatment given in January 2018.  Interim History: Ms. Sobel presents today for a follow-up visit. Since the last visit, she underwent D&C and removal of fibroid tumor on 01/11/2017. She tolerated the procedure well and received 2 separate infusions of Feraheme after that. She reports feeling reasonably well at this time with improvement in energy and performance status. Despite her procedure, she continues to experience heavy menstrual cycles. Hematochezia or melena. She denied excessive fatigue or dyspnea. She continues to attend activities of daily living without any decline.  She does not report any blurred vision or syncope. She did not report any seizures. She did not report any chest pain or palpitation but does report exertional dyspnea. She did not report any cough or wheezing. He did not report any nausea or vomiting but does report some occasional abdominal pain and dyspepsia related to the iron. She has not reported any frequency, urgency or hematuria. Did not report any skeletal complaints of arthralgias or myalgias.  Rest of the review of systems unremarkable.   Medications: I have reviewed the patient's current medications.  Current Outpatient Prescriptions  Medication Sig Dispense Refill  . ibuprofen (ADVIL,MOTRIN) 600 MG tablet Take ibuprofen 600 mg orally every 6 hours for 3 days and then every 6 hours as needed for pain 60 tablet 0  . NUVARING 0.12-0.015 MG/24HR vaginal ring Place 1  Device vaginally as directed.     No current facility-administered medications for this visit.      Allergies: No Known Allergies  Past Medical History, Surgical history, Social history, and Family History were reviewed and updated.  Physical Exam: Blood pressure (!) 113/57, pulse 77, temperature 98.2 F (36.8 C), temperature source Oral, resp. rate 20, height 5\' 3"  (1.6 m), weight 262 lb 4.8 oz (119 kg), SpO2 100 %. ECOG: 0 General appearance: A well-appearing woman without distress. Head: Normocephalic, without obvious abnormality no oral thrush or lesions. Neck: no adenopathy Lymph nodes: Cervical, supraclavicular, and axillary nodes normal. Heart:regular rate and rhythm, S1, S2 normal, no murmur, click, rub or gallop Lung:chest clear, no wheezing, rales, normal symmetric air entry Abdomin: soft, non-tender, without masses or organomegaly no rebound or guarding. EXT:no erythema, induration, or nodules   Lab Results: Lab Results  Component Value Date   WBC 7.6 02/11/2017   HGB 11.5 (L) 02/11/2017   HCT 34.8 02/11/2017   MCV 90.3 02/11/2017   PLT 182 02/11/2017     Chemistry      Component Value Date/Time   NA 140 01/22/2016 1940   NA 141 08/22/2014 1353   K 4.0 01/22/2016 1940   K 4.1 08/22/2014 1353   CL 107 01/22/2016 1940   CO2 22 01/22/2016 1940   CO2 25 08/22/2014 1353   BUN 12 01/22/2016 1940   BUN 10.6 08/22/2014 1353   CREATININE 0.98 01/22/2016 1940   CREATININE 1.0 08/22/2014 1353      Component Value Date/Time   CALCIUM 9.2 01/22/2016 1940   CALCIUM 9.0 08/22/2014 1353   ALKPHOS 54 02/17/2015  [id number] 08/22/2014 1353   AST 14 02/17/2015 1035   AST 19 08/22/2014 1353   ALT 13 02/17/2015 1035   ALT 16 08/22/2014 1353   BILITOT 0.5 02/17/2015 1035   BILITOT 0.57 08/22/2014 1353      Results for Kathy Werner, Kathy Werner (MRN [id number]) as of 02/11/2017 15:53  Ref. Range 01/07/2017 15:00  Iron Latest Ref Range: 41 - 142 ug/dL 20 (L)  UIBC Latest  Ref Range: 120 - 384 ug/dL 380  TIBC Latest Ref Range: 236 - 444 ug/dL 400  %SAT Latest Ref Range: 21 - 57 % 5 (L)  Ferritin Latest Ref Range: 9 - 269 ng/ml 6 (L)     Impression and Plan:  41 year old woman with the following issues:  1. Iron deficiency anemia diagnosed in 2015 and has been recurrent because of menorrhagia. She is status post intravenous iron because of intolerance to oral iron with excellent response in the past.   Her iron studies in 01/07/2017 showed persistent iron deficiency and she underwent IV iron infusion and that time for a total of 1 g of Feraheme.Her hemoglobin today is improving and iron studies are currently pending. The plan is to continue to follow her every 3 months and repeat IV iron as needed.  2. Menorrhagia related to uterine fibroids: She is following up with her OB/GYN regarding this issue. She might require a hysterectomy for definitive treatment of her recurrent iron deficiency anemia.  3. Follow-up: Will be in 3 months to recheck her hemoglobin and iron studies.   Zola Button, MD 3/2/20184:22 PM

## 2017-05-05 ENCOUNTER — Ambulatory Visit: Payer: 59 | Admitting: Oncology

## 2017-05-05 ENCOUNTER — Other Ambulatory Visit: Payer: 59

## 2017-05-05 ENCOUNTER — Ambulatory Visit: Payer: 59

## 2017-05-10 ENCOUNTER — Telehealth: Payer: Self-pay | Admitting: Oncology

## 2017-05-10 NOTE — Telephone Encounter (Signed)
Patient wanted to cancel her iron infusion for 5/31

## 2017-05-10 NOTE — Telephone Encounter (Signed)
Appointment cancelled

## 2017-05-12 ENCOUNTER — Ambulatory Visit: Payer: 59

## 2017-11-10 IMAGING — CR DG ABDOMEN 1V
1 series · 1 of 1 positions shown · non-contrast
Comparison: None.

CLINICAL DATA: IUD location

EXAM:
ABDOMEN - 1 VIEW

[t abdomen supine]
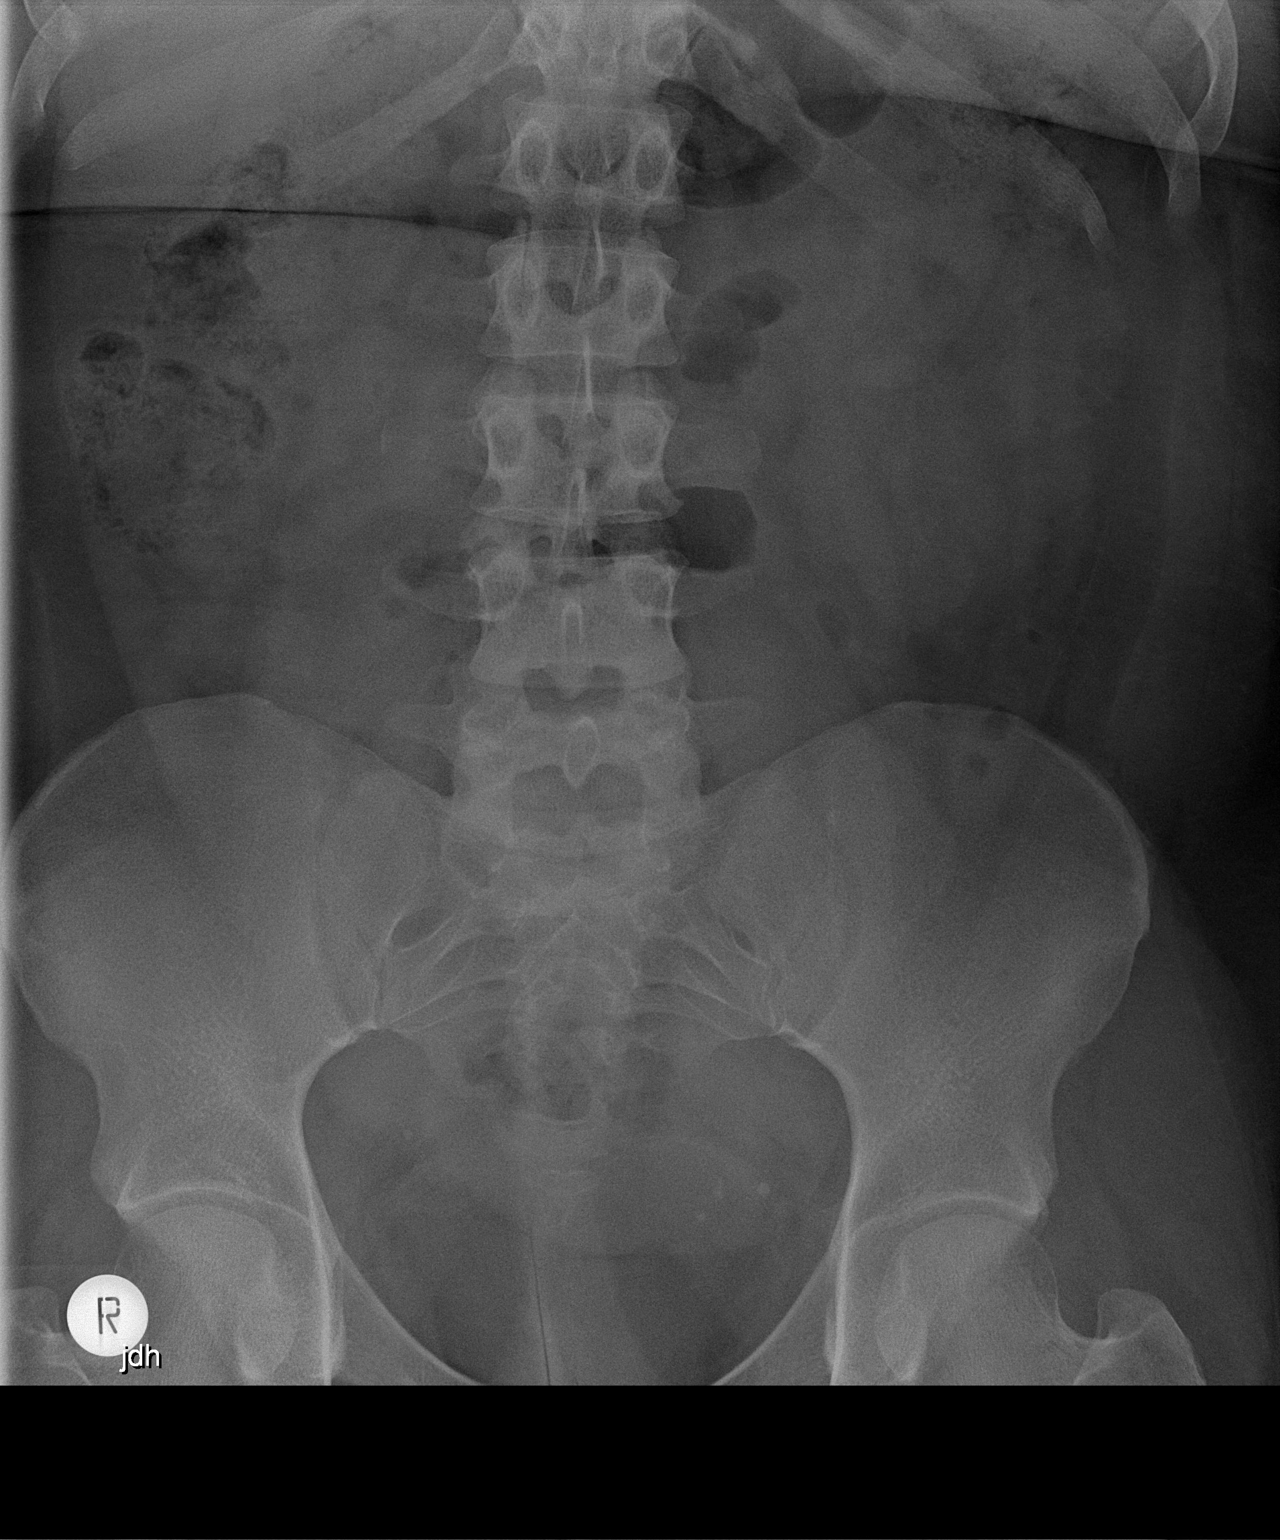

[1 of 1 positions shown; findings below may reference images not displayed]

FINDINGS: IUD not identified.

Normal bowel gas pattern.  No renal calculi.  No bony abnormality.
IMPRESSION: Negative for IUD.

## 2017-11-17 ENCOUNTER — Other Ambulatory Visit: Payer: Self-pay | Admitting: Obstetrics and Gynecology

## 2017-11-17 DIAGNOSIS — D250 Submucous leiomyoma of uterus: Secondary | ICD-10-CM

## 2017-12-15 ENCOUNTER — Other Ambulatory Visit: Payer: Self-pay | Admitting: Obstetrics and Gynecology

## 2017-12-15 ENCOUNTER — Ambulatory Visit
Admission: RE | Admit: 2017-12-15 | Discharge: 2017-12-15 | Disposition: A | Discharge status: Home / Self Care | Payer: 59 | Source: Ambulatory Visit | Attending: Obstetrics and Gynecology | Admitting: Obstetrics and Gynecology

## 2017-12-15 ENCOUNTER — Encounter: Payer: Self-pay | Admitting: *Deleted

## 2017-12-15 ENCOUNTER — Other Ambulatory Visit (HOSPITAL_COMMUNITY): Payer: Self-pay | Admitting: Interventional Radiology

## 2017-12-15 DIAGNOSIS — D250 Submucous leiomyoma of uterus: Secondary | ICD-10-CM

## 2017-12-15 HISTORY — PX: IR RADIOLOGIST EVAL & MGMT: IMG5224

## 2017-12-15 NOTE — Consult Note (Signed)
Chief Complaint: Patient was seen in consultation today for  Chief Complaint  Patient presents with  . Advice Only    Consult for [place]     Referring Physician(s): Haygood,Vanessa P  History of Present Illness: Kathy Werner is a 42 y.o. (G3, P2) female with past medical history significant for anemia who presents today to the interventional radiology clinic for potential percutaneous management of chronic symptomatic uterine fibroids. The patient is unaccompanied and serves as his own historian.  Patient was initially diagnosed with uterine fibroids approximately 4-5 years ago during the workup for menorrhagia. Since that time, the patient states that her menorrhagia has worsened with her periods currently lasting approximately 21 days and associated with the passing of clots and the changing of pads every 2 hours when her cycle is at its worst. These findings are associated with anemia for which the patient has previously received blood transfusions.  The patient underwent an endometrial ablation in March of this year however this had limited results. Patient does state that her menorrhagia is minimally improved with implantation of a nova ring.  Patient also admits to bulk symptoms including urinary frequency, urgency abdominal and pelvic pain and pressure as well as back pain.  The patient does not wish to have children in the future.    Past Medical History:  Diagnosis Date  . Anemia    receiving iron infusion 01/07/2017  . Fibroid   . Menorrhagia 03/2014  . Migraines   . Vaginal Pap smear, abnormal    now nl since L:EEP2002    Past Surgical History:  Procedure Laterality Date  . DILATATION & CURETTAGE/HYSTEROSCOPY WITH MYOSURE N/A 02/17/2015   Procedure: DILATATION & CURETTAGE/HYSTEROSCOPY WITH MYOSURE;  Surgeon: Eldred Manges, MD;  Location: [facility];  Service: Gynecology;  Laterality: N/A;  . DILATATION & CURETTAGE/HYSTEROSCOPY WITH MYOSURE N/A 01/11/2017   Procedure: DILATATION & CURETTAGE/HYSTEROSCOPY WITH MYOSURE, 2.9 RESECTION OF  FIBROID;  Surgeon: Eldred Manges, MD;  Location: [facility];  Service: Gynecology;  Laterality: N/A;  . DILITATION & CURRETTAGE/HYSTROSCOPY WITH HYDROTHERMAL ABLATION N/A 01/11/2017   Procedure: DILATATION & CURETTAGE/HYSTEROSCOPY WITH HYDROTHERMAL ABLATION;  Surgeon: Eldred Manges, MD;  Location: [facility];  Service: Gynecology;  Laterality: N/A;  Dr. Leo Grosser is asking that the HTA be completed before the resection  [place] HTA rep will be here   . IR RADIOLOGIST EVAL & MGMT  12/15/2017  . IUD REMOVAL  06/2013   due to worsening dysmenorrhea  . MIRENA     Inserted 01-24-13  . WISDOM TOOTH EXTRACTION      Allergies: Patient has no known allergies.  Medications: Prior to Admission medications   Medication Sig Start Date End Date Taking? Authorizing Provider  ibuprofen (ADVIL,MOTRIN) 600 MG tablet Take ibuprofen 600 mg orally every 6 hours for 3 days and then every 6 hours as needed for pain 01/11/17  Yes Haygood, Seymour Bars, MD  NUVARING 0.12-0.015 MG/24HR vaginal ring Place 1 Device vaginally as directed. 02/07/17   [provider]     Family History  Problem Relation Age of Onset  . Hypertension Paternal Grandmother   . Sudden death Neg Hx   . Hyperlipidemia Neg Hx   . Heart attack Neg Hx   . Diabetes Neg Hx     Social History   Socioeconomic History  . Marital status: Single    Spouse name: Not on file  . Number of children: Not on file  .  Years of education: Not on file  . Highest education level: Not on file  Social Needs  . Financial resource strain: Not on file  . Food insecurity - worry: Not on file  . Food insecurity - inability: Not on file  . Transportation needs - medical: Not on file  . Transportation needs - non-medical: Not on file  Occupational History  . Not on file  Tobacco Use  . Smoking status: Never Smoker  . Smokeless tobacco: Never Used   Substance and Sexual Activity  . Alcohol use: Yes    Comment: occ  . Drug use: No  . Sexual activity: Yes    Partners: Male    Birth control/protection: Condom, Pill    Comment: Mirena inserted 01-24-13 removed 2014  Other Topics Concern  . Not on file  Social History Narrative  . Not on file    ECOG Status: 1 - Symptomatic but completely ambulatory  Review of Systems: A 12 point ROS discussed and pertinent positives are indicated in the HPI above.  All other systems are negative.  Review of Systems  Constitutional: Negative for activity change, fatigue and fever.  Respiratory: Negative.   Cardiovascular: Negative.   Gastrointestinal: Negative.   Genitourinary: Positive for menstrual problem and pelvic pain.  Musculoskeletal: Positive for back pain.    Vital Signs: BP 125/73   Pulse 78   Temp 98.9 F (37.2 C) (Oral)   Resp 14   Ht 5\' 3"  (1.6 m)   Wt 260 lb (117.9 kg)   SpO2 97%   BMI 46.06 kg/m   Physical Exam  Constitutional: She appears well-developed and well-nourished.  HENT:  Head: Normocephalic and atraumatic.  Cardiovascular: Normal rate, regular rhythm and intact distal pulses.  I am able to palpate the right common femoral and dorsalis pedis arterial pulses.  Pulmonary/Chest: Effort normal and breath sounds normal.  Abdominal: Soft. She exhibits mass.  I am able to palpate the uterine fundus at the level of the umbilicus.  Skin: Skin is warm and dry. Capillary refill takes less than 2 seconds.  Psychiatric: She has a normal mood and affect. Her behavior is normal.  Nursing note and vitals reviewed.  Imaging: Ir Radiologist Eval & Mgmt  Result Date: 12/15/2017 Please refer to notes tab for details about interventional procedure. (Op Note)   Labs:  CBC: Recent Labs    01/07/17 1500 02/11/17 1536  WBC 8.0 7.6  HGB 9.1* 11.5*  HCT 27.9* 34.8  PLT 257 182    COAGS: No results for input(s): INR, APTT in the last 8760 hours.  BMP: No  results for input(s): NA, K, CL, CO2, GLUCOSE, BUN, CALCIUM, CREATININE, GFRNONAA, GFRAA in the last 8760 hours.  Invalid input(s): CMP  LIVER FUNCTION TESTS: No results for input(s): BILITOT, AST, ALT, ALKPHOS, PROT, ALBUMIN in the last 8760 hours.  TUMOR MARKERS: No results for input(s): AFPTM, CEA, CA199, CHROMGRNA in the last 8760 hours.   Endometrial Bx: 01/11/17 - No evidence of malignancy Pap Smear: recently negative (per pt report).  Assessment and Plan:  Kathy Werner is a 42 y.o. (G3, P2) female with past medical history significant for anemia who presents today to the interventional radiology clinic for potential percutaneous management of chronic symptomatic uterine fibroids.  Patient's primary fibroid related complaint is in regards to her profound menorrhagia resulting in her diagnosis of anemia and requiring previous blood transfusion. Patient also admits to bulk symptoms including urinary frequency, urgency, abdominal and pelvic pain as well  as back pain.   Prolonged conversations were held with the patient regarding potential treatment options for her symptomatic uterine fibroids including the following:  #1 - Continued conservative management.  I explained to the patient that her fibroid related symptoms will likely resolve at the time of menopause however this is likely over a decade away.  #2 - Definitive treatment with hysterectomy. Brief conversations were held with the patient regarding myomectomy, however I do not feel the patient would be a candidate for this procedure as she does not desire future fertility. At the present time, the patient does not wish to undergo a surgical intervention.  #3 - Uterine fibroid embolization.  The uterine artery embolization procedure was discussed in detail with the patient. I explained that both uterine arteries are typically embolized with small particles aimed at devascularizing the fibroids resulting in improvement in her  menorrhagia symptoms as well as potentially in her bulk symptoms. I explained that it typically takes about 3-6 months until a patient achieves the full benefit of the procedure at which time she may experience persistent menorrhagia as well as potential irregular cycles.  Additional risks and benefits of uterine artery embolization including radiation exposure, contrast exposure, infection and nontarget embolization were also discussed with the patient.  Following the above prolonged and detailed conversation, the patient wishes to proceed uterine fibroid embolization.  As such, we will first pursue a contrast enhanced pelvic MRI to better delineate fibroid burden and to ensure persistent enhancement.  Once the pelvic MRI is performed and reviewed, the uterine fibroid embolization will be scheduled at [facility] at a time in between her cycles. The embolization will entail an overnight admission for continued observation and PCA usage.  The patient was encouraged to call the interventional radiology clinic with any interval questions or concerns.  Thank you for this interesting consult.  I greatly enjoyed meeting Kathy Werner and look forward to participating in their care.  A copy of this report was sent to the requesting provider on this date.  Electronically Signed: Sandi Mariscal 12/15/2017, 3:41 PM   I spent a total of 30 Minutes in face to face in clinical consultation, greater than 50% of which was counseling/coordinating care for symptomatic uterine fibroids.

## 2017-12-16 ENCOUNTER — Ambulatory Visit (HOSPITAL_COMMUNITY)
Admission: RE | Admit: 2017-12-16 | Discharge: 2017-12-16 | Disposition: A | Discharge status: Home / Self Care | Payer: 59 | Source: Ambulatory Visit | Attending: Interventional Radiology | Admitting: Interventional Radiology

## 2017-12-16 DIAGNOSIS — D259 Leiomyoma of uterus, unspecified: Secondary | ICD-10-CM | POA: Insufficient documentation

## 2017-12-16 DIAGNOSIS — D250 Submucous leiomyoma of uterus: Secondary | ICD-10-CM | POA: Insufficient documentation

## 2017-12-16 MED ORDER — GADOBENATE DIMEGLUMINE 529 MG/ML IV SOLN
20.0000 mL | Freq: Once | INTRAVENOUS | Status: AC | PRN
  Administered 2017-12-16: 20 mL via INTRAVENOUS

## 2017-12-19 ENCOUNTER — Other Ambulatory Visit: Payer: Self-pay | Admitting: Interventional Radiology

## 2017-12-19 DIAGNOSIS — D219 Benign neoplasm of connective and other soft tissue, unspecified: Secondary | ICD-10-CM

## 2018-01-02 ENCOUNTER — Other Ambulatory Visit: Payer: Self-pay | Admitting: Radiology

## 2018-01-03 ENCOUNTER — Ambulatory Visit (HOSPITAL_COMMUNITY)
Admission: RE | Admit: 2018-01-03 | Discharge: 2018-01-03 | Disposition: A | Discharge status: Home / Self Care | Payer: 59 | Source: Ambulatory Visit | Attending: Interventional Radiology | Admitting: Interventional Radiology

## 2018-01-03 ENCOUNTER — Observation Stay (HOSPITAL_COMMUNITY)
Admission: RE | Admit: 2018-01-03 | Discharge: 2018-01-04 | Disposition: A | Discharge status: Home / Self Care | Payer: 59 | Source: Ambulatory Visit | Attending: Interventional Radiology | Admitting: Interventional Radiology

## 2018-01-03 ENCOUNTER — Encounter (HOSPITAL_COMMUNITY): Payer: Self-pay

## 2018-01-03 DIAGNOSIS — D259 Leiomyoma of uterus, unspecified: Principal | ICD-10-CM | POA: Diagnosis present

## 2018-01-03 DIAGNOSIS — D219 Benign neoplasm of connective and other soft tissue, unspecified: Secondary | ICD-10-CM

## 2018-01-03 HISTORY — PX: IR ANGIOGRAM PELVIS SELECTIVE OR SUPRASELECTIVE: IMG661

## 2018-01-03 HISTORY — PX: IR ANGIOGRAM SELECTIVE EACH ADDITIONAL VESSEL: IMG667

## 2018-01-03 HISTORY — PX: IR EMBO TUMOR ORGAN ISCHEMIA INFARCT INC GUIDE ROADMAPPING: IMG5449

## 2018-01-03 HISTORY — PX: IR US GUIDE VASC ACCESS RIGHT: IMG2390

## 2018-01-03 LAB — CBC WITH DIFFERENTIAL/PLATELET
BASOS PCT: 0 %
Basophils Absolute: 0 10*3/uL (ref 0.0–0.1)
EOS ABS: 0.1 10*3/uL (ref 0.0–0.7)
EOS PCT: 1 %
HCT: 40.2 % (ref 36.0–46.0)
Hemoglobin: 13.4 g/dL (ref 12.0–15.0)
LYMPHS ABS: 2.8 10*3/uL (ref 0.7–4.0)
Lymphocytes Relative: 35 %
MCH: 30.8 pg (ref 26.0–34.0)
MCHC: 33.3 g/dL (ref 30.0–36.0)
MCV: 92.4 fL (ref 78.0–100.0)
Monocytes Absolute: 0.7 10*3/uL (ref 0.1–1.0)
Monocytes Relative: 9 %
NEUTROS PCT: 55 %
Neutro Abs: 4.3 10*3/uL (ref 1.7–7.7)
PLATELETS: 236 10*3/uL (ref 150–400)
RBC: 4.35 MIL/uL (ref 3.87–5.11)
RDW: 12 % (ref 11.5–15.5)
WBC: 8 10*3/uL (ref 4.0–10.5)

## 2018-01-03 LAB — BASIC METABOLIC PANEL
Anion gap: 6 (ref 5–15)
BUN: 16 mg/dL (ref 6–20)
CALCIUM: 9.2 mg/dL (ref 8.9–10.3)
CO2: 22 mmol/L (ref 22–32)
CREATININE: 0.91 mg/dL (ref 0.44–1.00)
Chloride: 109 mmol/L (ref 101–111)
GFR calc non Af Amer: >60 mL/min (ref >60)
Glucose, Bld: 94 mg/dL (ref 65–99)
Potassium: 4.1 mmol/L (ref 3.5–5.1)
SODIUM: 137 mmol/L (ref 135–145)

## 2018-01-03 LAB — HCG, SERUM, QUALITATIVE: PREG SERUM: NEGATIVE

## 2018-01-03 LAB — PROTIME-INR
INR: 1.06
PROTHROMBIN TIME: 13.7 s (ref 11.4–15.2)

## 2018-01-03 MED ORDER — SODIUM CHLORIDE 0.9% FLUSH: 3.0000 mL | INTRAVENOUS | Status: DC | PRN

## 2018-01-03 MED ORDER — DIPHENHYDRAMINE HCL 50 MG/ML IJ SOLN: 12.5000 mg | Freq: Four times a day (QID) | INTRAMUSCULAR | Status: DC | PRN

## 2018-01-03 MED ORDER — NALOXONE HCL 0.4 MG/ML IJ SOLN: 0.4000 mg | INTRAMUSCULAR | Status: DC | PRN

## 2018-01-03 MED ORDER — IOPAMIDOL (ISOVUE-300) INJECTION 61%
50.0000 mL | Freq: Once | INTRAVENOUS | Status: AC | PRN
  Administered 2018-01-03: 50 mL via INTRA_ARTERIAL

## 2018-01-03 MED ORDER — HYDROMORPHONE HCL 1 MG/ML IJ SOLN
1.0000 mg | Freq: Once | INTRAMUSCULAR | Status: AC
  Administered 2018-01-03: 1 mg via INTRAVENOUS
  Filled 2018-01-03: qty 1

## 2018-01-03 MED ORDER — DIPHENHYDRAMINE HCL 12.5 MG/5ML PO ELIX
12.5000 mg | ORAL_SOLUTION | Freq: Four times a day (QID) | ORAL | Status: DC | PRN
Start: 2018-01-03 — End: 2018-01-04

## 2018-01-03 MED ORDER — SODIUM CHLORIDE 0.9% FLUSH: 9.0000 mL | INTRAVENOUS | Status: DC | PRN

## 2018-01-03 MED ORDER — HYDROMORPHONE 1 MG/ML IV SOLN
INTRAVENOUS | Status: DC
  Administered 2018-01-03: 2.7 mg via INTRAVENOUS
  Administered 2018-01-03: 11:00:00 via INTRAVENOUS
  Administered 2018-01-03: 0.6 mg via INTRAVENOUS
  Administered 2018-01-03: 1.5 mg via INTRAVENOUS
  Administered 2018-01-04: 0.3 mg via INTRAVENOUS
  Administered 2018-01-04: 0.6 mg via INTRAVENOUS
  Filled 2018-01-03 (×2): qty 25

## 2018-01-03 MED ORDER — FENTANYL CITRATE (PF) 100 MCG/2ML IJ SOLN
INTRAMUSCULAR | Status: AC | PRN
  Administered 2018-01-03: 50 ug via INTRAVENOUS
  Administered 2018-01-03: 25 ug via INTRAVENOUS
  Administered 2018-01-03: 50 ug via INTRAVENOUS
  Administered 2018-01-03: 25 ug via INTRAVENOUS
  Administered 2018-01-03: 50 ug via INTRAVENOUS

## 2018-01-03 MED ORDER — LIDOCAINE HCL 1 % IJ SOLN
INTRAMUSCULAR | Status: AC
  Filled 2018-01-03: qty 20

## 2018-01-03 MED ORDER — LIDOCAINE HCL 1 % IJ SOLN
INTRAMUSCULAR | Status: AC | PRN
  Administered 2018-01-03: 5 mL

## 2018-01-03 MED ORDER — ONDANSETRON HCL 4 MG/2ML IJ SOLN
INTRAMUSCULAR | Status: AC
  Administered 2018-01-03: 4 mg via INTRAVENOUS
  Filled 2018-01-03: qty 2

## 2018-01-03 MED ORDER — SODIUM CHLORIDE 0.9 % IV SOLN
INTRAVENOUS | Status: DC
  Administered 2018-01-03: 08:00:00 via INTRAVENOUS

## 2018-01-03 MED ORDER — MIDAZOLAM HCL 2 MG/2ML IJ SOLN
INTRAMUSCULAR | Status: AC | PRN
  Administered 2018-01-03 (×2): 1 mg via INTRAVENOUS

## 2018-01-03 MED ORDER — ONDANSETRON HCL 4 MG/2ML IJ SOLN
4.0000 mg | Freq: Once | INTRAMUSCULAR | Status: AC
  Administered 2018-01-03: 4 mg via INTRAVENOUS
  Filled 2018-01-03: qty 2

## 2018-01-03 MED ORDER — SODIUM CHLORIDE 0.9 % IV SOLN: 250.0000 mL | INTRAVENOUS | Status: DC | PRN

## 2018-01-03 MED ORDER — KETOROLAC TROMETHAMINE 30 MG/ML IJ SOLN
INTRAMUSCULAR | Status: AC
  Administered 2018-01-03: 30 mg via INTRAVENOUS
  Filled 2018-01-03: qty 1

## 2018-01-03 MED ORDER — KETOROLAC TROMETHAMINE 30 MG/ML IJ SOLN
30.0000 mg | INTRAMUSCULAR | Status: AC
  Administered 2018-01-03: 30 mg via INTRAVENOUS
  Filled 2018-01-03: qty 1

## 2018-01-03 MED ORDER — ONDANSETRON HCL 4 MG/2ML IJ SOLN: 4.0000 mg | Freq: Four times a day (QID) | INTRAMUSCULAR | Status: DC | PRN

## 2018-01-03 MED ORDER — DOCUSATE SODIUM 100 MG PO CAPS
100.0000 mg | ORAL_CAPSULE | Freq: Two times a day (BID) | ORAL | Status: DC
  Administered 2018-01-03 – 2018-01-04 (×3): 100 mg via ORAL
  Filled 2018-01-03 (×3): qty 1

## 2018-01-03 MED ORDER — FENTANYL CITRATE (PF) 100 MCG/2ML IJ SOLN
INTRAMUSCULAR | Status: AC
  Filled 2018-01-03: qty 6

## 2018-01-03 MED ORDER — MIDAZOLAM HCL 2 MG/2ML IJ SOLN
INTRAMUSCULAR | Status: AC
  Filled 2018-01-03: qty 6

## 2018-01-03 MED ORDER — IOPAMIDOL (ISOVUE-300) INJECTION 61%
INTRAVENOUS | Status: AC
  Administered 2018-01-03: 30 mL via INTRA_ARTERIAL
  Filled 2018-01-03: qty 200

## 2018-01-03 MED ORDER — CEFAZOLIN SODIUM-DEXTROSE 2-4 GM/100ML-% IV SOLN
INTRAVENOUS | Status: AC
  Filled 2018-01-03: qty 100

## 2018-01-03 MED ORDER — KETOROLAC TROMETHAMINE 30 MG/ML IJ SOLN
30.0000 mg | Freq: Four times a day (QID) | INTRAMUSCULAR | Status: DC
  Administered 2018-01-03 – 2018-01-04 (×3): 30 mg via INTRAVENOUS
  Filled 2018-01-03 (×3): qty 1

## 2018-01-03 MED ORDER — SODIUM CHLORIDE 0.9% FLUSH
3.0000 mL | Freq: Two times a day (BID) | INTRAVENOUS | Status: DC
  Administered 2018-01-03: 3 mL via INTRAVENOUS

## 2018-01-03 MED ORDER — CEFAZOLIN SODIUM-DEXTROSE 2-4 GM/100ML-% IV SOLN
2.0000 g | INTRAVENOUS | Status: AC
  Administered 2018-01-03: 2 g via INTRAVENOUS

## 2018-01-03 MED ORDER — IOPAMIDOL (ISOVUE-300) INJECTION 61%
30.0000 mL | Freq: Once | INTRAVENOUS | Status: AC | PRN
  Administered 2018-01-03: 30 mL via INTRA_ARTERIAL

## 2018-01-03 NOTE — H&P (Signed)
Referring Physician(s): Haygood,V  Supervising Physician: Sandi Mariscal  Patient Status:  [facility] OP TBA  Chief Complaint:  Symptomatic uterine fibroids  Subjective: Patient familiar to IR service from consultation with Dr. Pascal Lux on 12/15/17 to discuss treatment options for symptomatic uterine fibroids.  She was deemed an appropriate candidate for bilateral uterine artery embolization and presents today for the procedure.  She currently denies fever, headache, chest pain, dyspnea, cough, abdominal pain, nausea, vomiting, dysuria, hematuria.  She does have intermittent low back pain as well as menorrhagia, urinary frequency. Past Medical History:  Diagnosis Date  . Anemia    receiving iron infusion 01/07/2017  . Fibroid   . Menorrhagia 03/2014  . Migraines   . Vaginal Pap smear, abnormal    now nl since L:EEP2002   Past Surgical History:  Procedure Laterality Date  . DILATATION & CURETTAGE/HYSTEROSCOPY WITH MYOSURE N/A 02/17/2015   Procedure: DILATATION & CURETTAGE/HYSTEROSCOPY WITH MYOSURE;  Surgeon: Eldred Manges, MD;  Location: [facility];  Service: Gynecology;  Laterality: N/A;  . DILATATION & CURETTAGE/HYSTEROSCOPY WITH MYOSURE N/A 01/11/2017   Procedure: DILATATION & CURETTAGE/HYSTEROSCOPY WITH MYOSURE, 2.9 RESECTION OF  FIBROID;  Surgeon: Eldred Manges, MD;  Location: [facility];  Service: Gynecology;  Laterality: N/A;  . DILITATION & CURRETTAGE/HYSTROSCOPY WITH HYDROTHERMAL ABLATION N/A 01/11/2017   Procedure: DILATATION & CURETTAGE/HYSTEROSCOPY WITH HYDROTHERMAL ABLATION;  Surgeon: Eldred Manges, MD;  Location: [facility];  Service: Gynecology;  Laterality: N/A;  Dr. Leo Grosser is asking that the HTA be completed before the resection  [place] HTA rep will be here   . IR RADIOLOGIST EVAL & MGMT  12/15/2017  . IUD REMOVAL  06/2013   due to worsening dysmenorrhea  . MIRENA     Inserted 01-24-13  . WISDOM TOOTH EXTRACTION        Allergies: Patient has no  known allergies.  Medications: Prior to Admission medications   Medication Sig Start Date End Date Taking? Authorizing Provider  ibuprofen (ADVIL,MOTRIN) 600 MG tablet Take ibuprofen 600 mg orally every 6 hours for 3 days and then every 6 hours as needed for pain 01/11/17  Yes Haygood, Seymour Bars, MD  NUVARING 0.12-0.015 MG/24HR vaginal ring Place 1 Device vaginally as directed. 02/07/17   [provider]     Vital Signs: BP 118/60 (BP Location: Right Arm)   Pulse 86   Temp 98.3 F (36.8 C) (Oral)   Resp 16   LMP 12/06/2017 (Exact Date)   SpO2 100%   Physical Exam awake, alert.  Chest clear to auscultation bilaterally.  Heart with regular rate and rhythm.  Abdomen obese, soft, positive bowel sounds, fibroid uterus, not significantly tender.  No lower extremity edema.   Imaging: No results found.  Labs:  CBC: Recent Labs    01/07/17 1500 02/11/17 1536 01/03/18 0755  WBC 8.0 7.6 8.0  HGB 9.1* 11.5* 13.4  HCT 27.9* 34.8 40.2  PLT 257 182 236    COAGS: No results for input(s): INR, APTT in the last 8760 hours.  BMP: Recent Labs    01/03/18 0755  NA 137  K 4.1  CL 109  CO2 22  GLUCOSE 94  BUN 16  CALCIUM 9.2  CREATININE 0.91  GFRNONAA >60  GFRAA >60    LIVER FUNCTION TESTS: No results for input(s): BILITOT, AST, ALT, ALKPHOS, PROT, ALBUMIN in the last 8760 hours.  Assessment and Plan: Patient with history of symptomatic uterine fibroids, status post consultation with  Dr. Pascal Lux on 12/15/17 to discuss treatment options.  She was deemed an appropriate candidate for bilateral uterine artery embolization and presents today for the procedure.Risks and benefits of above procedure were discussed with the patient/family including, but not limited to bleeding, infection, vascular injury or contrast induced renal failure.  This interventional procedure involves the use of X-rays and because of the nature of the planned procedure, it is possible that we will have  prolonged use of X-ray fluoroscopy.  Potential radiation risks to you include (but are not limited to) the following: - A slightly elevated risk for cancer  several years later in life. This risk is typically less than 0.5% percent. This risk is low in comparison to the normal incidence of human cancer, which is 33% for women and 50% for men according to the [employer]. - Radiation induced injury can include skin redness, resembling a rash, tissue breakdown / ulcers and hair loss (which can be temporary or permanent).   The likelihood of either of these occurring depends on the difficulty of the procedure and whether you are sensitive to radiation due to previous procedures, disease, or genetic conditions.   IF your procedure requires a prolonged use of radiation, you will be notified and given written instructions for further action.  It is your responsibility to monitor the irradiated area for the 2 weeks following the procedure and to notify your physician if you are concerned that you have suffered a radiation induced injury.    All of the patient's questions were answered, patient is agreeable to proceed.  Consent signed and in chart.  Post procedure she will be admitted for overnight observation for pain control.    Electronically Signed: D. Rowe Robert, PA-C 01/03/2018, 8:41 AM   I spent a total of 30 minutes at the the patient's bedside AND on the patient's hospital floor or unit, greater than 50% of which was counseling/coordinating care for bilateral uterine artery embolization

## 2018-01-03 NOTE — Progress Notes (Signed)
Pt gave permission for her husband and daughter to remain in the room while questions on the nursing admission history were asked. Lucius Conn BSN, RN-BC Admissions RN 01/03/2018 5:07 PM

## 2018-01-03 NOTE — Sedation Documentation (Signed)
Pt reports cramping pain that pt rates a 6 out of 10.  Pain medication given.

## 2018-01-03 NOTE — Procedures (Signed)
Pre-procedure Diagnosis: Symptomatic uterine fibroids Post-procedure Diagnosis: Same  Post bilateral uterine artery emoblization.    Complications: None Immediate  EBL: None  Keep right leg straight for 4 hrs (until 1530)  Signed: Sandi Mariscal Pager: [phone] 01/03/2018, 11:46 AM

## 2018-01-03 NOTE — Progress Notes (Signed)
Patient with intermittent pelvic cramping; denies nausea, vomiting or respiratory difficulties. Vital signs stable, afebrile Abdomen soft, puncture site right groin clean, dry, nontender, no hematoma.  Yellow urine in Foley cath.  A/P: Symptomatic uterine fibroids, status post bilateral uterine artery embolization earlier today; for overnight observation for pain control; DC Foley catheter later today; advance diet as tolerated; follow-up with Dr. Pascal Lux in [place] [facility] in 3-4 weeks      Rowe Robert, [facility]

## 2018-01-04 ENCOUNTER — Other Ambulatory Visit: Payer: Self-pay

## 2018-01-04 DIAGNOSIS — D259 Leiomyoma of uterus, unspecified: Secondary | ICD-10-CM | POA: Diagnosis not present

## 2018-01-04 MED ORDER — HYDROCODONE-ACETAMINOPHEN 5-325 MG PO TABS
1.0000 | ORAL_TABLET | ORAL | Status: DC | PRN
  Administered 2018-01-04: 2 via ORAL
  Filled 2018-01-04: qty 2

## 2018-01-04 NOTE — Discharge Instructions (Signed)
Uterine Artery Embolization for Fibroids, Care After °Refer to this sheet in the next few weeks. These instructions provide you with information on caring for yourself after your procedure. Your health care provider may also give you more specific instructions. Your treatment has been planned according to current medical practices, but problems sometimes occur. Call your health care provider if you have any problems or questions after your procedure. °What can I expect after the procedure? °After your procedure, it is typical to have cramping in the pelvis. You will be given pain medicine to control it. °Follow these instructions at home: °· Only take over-the-counter or prescription medicines for pain, discomfort, or fever as directed by your health care provider. °· Do not take aspirin. It can cause bleeding. °· Follow your health care provider’s advice regarding medicines given to you, diet, activity, and when to begin sexual activity. °· See your health care provider for follow-up care as directed. °Contact a health care provider if: °· You have a fever. °· You have redness, swelling, and pain around your incision site. °· You have pus draining from your incision. °· You have a rash. °Get help right away if: °· You have bleeding from your incision site. °· You have difficulty breathing. °· You have chest pain. °· You have severe abdominal pain. °· You have leg pain. °· You become dizzy and faint. °This information is not intended to replace advice given to you by your health care provider. Make sure you discuss any questions you have with your health care provider. °Document Released: 09/19/2013 Document Revised: 05/06/2016 Document Reviewed: 06/14/2013 °Elsevier Interactive Patient Education © 2018 [employer] ° °

## 2018-01-04 NOTE — Progress Notes (Signed)
Patient ambulated in the hallway 2x this morning. Patient states she still hasn't been able to pass gas . Will keep mointoring.

## 2018-01-04 NOTE — Discharge Summary (Signed)
Patient ID: TYLER ROBIDOUX MRN: [id number] DOB/AGE: 1976/02/27 42 y.o.  Admit date: 01/03/2018 Discharge date: 01/04/2018  Supervising Physician: Sandi Mariscal  Patient Status: [facility] - In-pt  Admission Diagnoses: Symptomatic uterine fibroids  Discharge Diagnoses: Symptomatic uterine fibroids, status post successful bilateral uterine artery embolization on 01/03/18 Active Problems:   Fibroids  Past Medical History:  Diagnosis Date  . Anemia    receiving iron infusion 01/07/2017  . Fibroid   . Menorrhagia 03/2014  . Migraines   . Vaginal Pap smear, abnormal    now nl since L:EEP2002   Past Surgical History:  Procedure Laterality Date  . DILATATION & CURETTAGE/HYSTEROSCOPY WITH MYOSURE N/A 02/17/2015   Procedure: DILATATION & CURETTAGE/HYSTEROSCOPY WITH MYOSURE;  Surgeon: Eldred Manges, MD;  Location: [place] [facility];  Service: Gynecology;  Laterality: N/A;  . DILATATION & CURETTAGE/HYSTEROSCOPY WITH MYOSURE N/A 01/11/2017   Procedure: DILATATION & CURETTAGE/HYSTEROSCOPY WITH MYOSURE, 2.9 RESECTION OF  FIBROID;  Surgeon: Eldred Manges, MD;  Location: [place] [facility];  Service: Gynecology;  Laterality: N/A;  . DILITATION & CURRETTAGE/HYSTROSCOPY WITH HYDROTHERMAL ABLATION N/A 01/11/2017   Procedure: DILATATION & CURETTAGE/HYSTEROSCOPY WITH HYDROTHERMAL ABLATION;  Surgeon: Eldred Manges, MD;  Location: [place] [facility];  Service: Gynecology;  Laterality: N/A;  Dr. Leo Grosser is asking that the HTA be completed before the resection  [place] HTA rep will be here   . IR ANGIOGRAM PELVIS SELECTIVE OR SUPRASELECTIVE  01/03/2018  . IR ANGIOGRAM PELVIS SELECTIVE OR SUPRASELECTIVE  01/03/2018  . IR ANGIOGRAM SELECTIVE EACH ADDITIONAL VESSEL  01/03/2018  . IR ANGIOGRAM SELECTIVE EACH ADDITIONAL VESSEL  01/03/2018  . IR EMBO TUMOR ORGAN ISCHEMIA INFARCT INC GUIDE ROADMAPPING  01/03/2018  . IR RADIOLOGIST EVAL & MGMT  12/15/2017  . IR US GUIDE VASC ACCESS RIGHT  01/03/2018  . IUD REMOVAL   06/2013   due to worsening dysmenorrhea  . MIRENA     Inserted 01-24-13  . WISDOM TOOTH EXTRACTION       Discharged Condition: good  Hospital Course: Mrs. Lubinski is a 42 year old female with history of symptomatic uterine fibroids, status post recent consultation with Dr. Pascal Lux on 12/15/17 to discuss treatment options.  She was deemed an appropriate candidate for bilateral uterine artery embolization and underwent the procedure at [facility] on 01/03/18.  The procedure was performed without immediate complications and she was admitted for overnight observation for pain control.  She was placed on Dilaudid PCA pump.  Overnight she did well with exception of some expected mild intermittent pelvic cramping.  On the morning of discharge she was stable.  She continued to have some intermittent pelvic cramping but was able to void, tolerate her diet and ambulate without significant difficulty. She was deemed stable for discharge at this time.  She was given prescriptions for ibuprofen 600 mg, #20, no refill, 1 tablet every 6 hours for the next 5 days, Colace 100 mg, #20, no refill, 1 tablet twice daily as needed for constipation, Norco 5/325, #30, no refill, 1-2 tablets every 4-6 hours as needed for moderate to severe pain, Zofran 8 mg, #20, no refill, 1 tablet twice daily as needed for nausea.  She will resume her current home medications.  She will be scheduled for follow-up in the IR clinic with Dr. Pascal Lux in 3-4 weeks.  She will continue current gynecologic care with Dr. Leo Grosser as scheduled.  She was told to contact our service with any additional questions or concerns.  Consults: none  Significant Diagnostic Studies:  Results for orders placed or performed during the hospital encounter of 51/02/58  Basic metabolic panel  Result Value Ref Range   Sodium 137 135 - 145 mmol/L   Potassium 4.1 3.5 - 5.1 mmol/L   Chloride 109 101 - 111 mmol/L   CO2 22 22 - 32 mmol/L   Glucose, Bld 94 65 - 99 mg/dL    BUN 16 6 - 20 mg/dL   Creatinine, Ser 0.91 0.44 - 1.00 mg/dL   Calcium 9.2 8.9 - 10.3 mg/dL   GFR calc non Af Amer >60 >60 mL/min   GFR calc Af Amer >60 >60 mL/min   Anion gap 6 5 - 15  CBC with Differential/Platelet  Result Value Ref Range   WBC 8.0 4.0 - 10.5 K/uL   RBC 4.35 3.87 - 5.11 MIL/uL   Hemoglobin 13.4 12.0 - 15.0 g/dL   HCT 40.2 36.0 - 46.0 %   MCV 92.4 78.0 - 100.0 fL   MCH 30.8 26.0 - 34.0 pg   MCHC 33.3 30.0 - 36.0 g/dL   RDW 12.0 11.5 - 15.5 %   Platelets 236 150 - 400 K/uL   Neutrophils Relative % 55 %   Neutro Abs 4.3 1.7 - 7.7 K/uL   Lymphocytes Relative 35 %   Lymphs Abs 2.8 0.7 - 4.0 K/uL   Monocytes Relative 9 %   Monocytes Absolute 0.7 0.1 - 1.0 K/uL   Eosinophils Relative 1 %   Eosinophils Absolute 0.1 0.0 - 0.7 K/uL   Basophils Relative 0 %   Basophils Absolute 0.0 0.0 - 0.1 K/uL  hCG, serum, qualitative  Result Value Ref Range   Preg, Serum NEGATIVE NEGATIVE  Protime-INR  Result Value Ref Range   Prothrombin Time 13.7 11.4 - 15.2 seconds   INR 1.06      Treatments: Successful bilateral uterine artery embolization via IV conscious sedation on 01/03/18  Discharge Exam: Blood pressure 120/61, pulse 69, temperature 98.9 F (37.2 C), temperature source Oral, resp. rate 19, last menstrual period 12/06/2017, SpO2 97 %. Patient awake, alert.  Chest clear to auscultation bilaterally.  Heart with regular rate and rhythm.  Abdomen obese, soft, positive bowel sounds, mild pelvic tenderness to palpation, fibroid uterus.  Puncture site right common femoral artery soft, clean, dry, nontender, no hematoma.  Extremities with intact distal pulses and no significant edema.  Disposition: 01-Home or Self Care  Discharge Instructions    Call MD for:  difficulty breathing, headache or visual disturbances   Complete by:  As directed    Call MD for:  extreme fatigue   Complete by:  As directed    Call MD for:  hives   Complete by:  As directed    Call MD for:   persistant dizziness or light-headedness   Complete by:  As directed    Call MD for:  persistant nausea and vomiting   Complete by:  As directed    Call MD for:  redness, tenderness, or signs of infection (pain, swelling, redness, odor or green/yellow discharge around incision site)   Complete by:  As directed    Call MD for:  severe uncontrolled pain   Complete by:  As directed    Call MD for:  temperature >100.4   Complete by:  As directed    Diet - low sodium heart healthy   Complete by:  As directed    Driving Restrictions   Complete by:  As directed    No driving  for the next 48 hours   Increase activity slowly   Complete by:  As directed    Lifting restrictions   Complete by:  As directed    No heavy lifting for the next 3-4 days   May shower / Bathe   Complete by:  As directed    May walk up steps   Complete by:  As directed    Sexual Activity Restrictions   Complete by:  As directed    No sexual intercourse for 1 week     Allergies as of 01/04/2018   No Known Allergies     Medication List    TAKE these medications   ibuprofen 600 MG tablet Commonly known as:  ADVIL,MOTRIN Take ibuprofen 600 mg orally every 6 hours for 3 days and then every 6 hours as needed for pain What changed:    how much to take  how to take this  when to take this  reasons to take this  additional instructions   NUVARING 0.12-0.015 MG/24HR vaginal ring Generic drug:  etonogestrel-ethinyl estradiol Place 1 Device vaginally every 28 (twenty-eight) days. Insert for 21 days, remove for 7 days, repeat      Follow-up Information    Sandi Mariscal, MD Follow up.   Specialties:  Interventional Radiology, Radiology Why:  Radiology service will call you with follow-up appointment with Dr. Pascal Lux in the IR clinic in 3-4 weeks.  Call [phone] or [phone] with any questions. Contact information: [place] [address] [phone]        Eldred Manges, MD Follow up.   Specialty:  Obstetrics and Gynecology Why:  Continue gynecologic care with Dr. Leo Grosser as scheduled Contact information: [address]. [address] [place] [address] [phone]            Electronically Signed: D. Rowe Robert, PA-C 01/04/2018, 12:17 PM   I have spent less than 30 minutes discharging Delmar Landau.

## 2018-01-09 ENCOUNTER — Other Ambulatory Visit: Payer: Self-pay | Admitting: Podiatry

## 2018-01-09 ENCOUNTER — Encounter: Payer: Self-pay | Admitting: Podiatry

## 2018-01-09 ENCOUNTER — Ambulatory Visit: Payer: 59 | Admitting: Podiatry

## 2018-01-09 ENCOUNTER — Ambulatory Visit (INDEPENDENT_AMBULATORY_CARE_PROVIDER_SITE_OTHER): Payer: 59

## 2018-01-09 DIAGNOSIS — M792 Neuralgia and neuritis, unspecified: Secondary | ICD-10-CM

## 2018-01-09 DIAGNOSIS — R52 Pain, unspecified: Secondary | ICD-10-CM

## 2018-01-09 DIAGNOSIS — G5791 Unspecified mononeuropathy of right lower limb: Secondary | ICD-10-CM

## 2018-01-09 DIAGNOSIS — M5431 Sciatica, right side: Secondary | ICD-10-CM

## 2018-01-09 DIAGNOSIS — N946 Dysmenorrhea, unspecified: Secondary | ICD-10-CM | POA: Insufficient documentation

## 2018-01-09 DIAGNOSIS — D649 Anemia, unspecified: Secondary | ICD-10-CM | POA: Insufficient documentation

## 2018-01-12 NOTE — Progress Notes (Signed)
   HPI: 42 year old female presenting today as a new patient with a chief complaint of tightness and numbness of the right great toe that began one week ago. She reports associated pain that radiates up the right leg into the calf. She has not done anything to treat the symptoms and there are no modifying factors noted. She reports having fibroid surgery last week. Patient is here for further evaluation and treatment.   Past Medical History:  Diagnosis Date  . Anemia    receiving iron infusion 01/07/2017  . Fibroid   . Menorrhagia 03/2014  . Migraines   . Vaginal Pap smear, abnormal    now nl since L:EEP2002     Physical Exam: General: The patient is alert and oriented x3 in no acute distress.  Dermatology: Skin is warm, dry and supple bilateral lower extremities. Negative for open lesions or macerations.  Vascular: Palpable pedal pulses bilaterally. No edema or erythema noted. Capillary refill within normal limits.  Neurological: Epicritic and protective threshold grossly intact bilaterally.   Musculoskeletal Exam: Range of motion within normal limits to all pedal and ankle joints bilateral. Muscle strength 5/5 in all groups bilateral.   Radiographic Exam:  Normal osseous mineralization. Joint spaces preserved. No fracture/dislocation/boney destruction.    Assessment: - right great toe neuritis - sciatica RLE   Plan of Care:  - Patient evaluated. X-Rays reviewed.  - Recommended wide fitting shoes. - Recommended follow up with a neurology consult to address sciatica pain secondary to surgery. - Return to clinic as needed.    Edrick Kins, DPM [facility]  Dr. Edrick Kins, DPM    [address], [place] [address]                Office [phone]  Fax [phone]

## 2018-02-02 ENCOUNTER — Other Ambulatory Visit: Payer: 59

## 2018-02-15 ENCOUNTER — Ambulatory Visit
Admission: RE | Admit: 2018-02-15 | Discharge: 2018-02-15 | Disposition: A | Discharge status: Home / Self Care | Payer: 59 | Source: Ambulatory Visit | Attending: Radiology | Admitting: Radiology

## 2018-02-15 ENCOUNTER — Encounter: Payer: Self-pay | Admitting: *Deleted

## 2018-02-15 DIAGNOSIS — D219 Benign neoplasm of connective and other soft tissue, unspecified: Secondary | ICD-10-CM

## 2018-02-15 HISTORY — PX: IR RADIOLOGIST EVAL & MGMT: IMG5224

## 2018-02-15 NOTE — Progress Notes (Signed)
Patient ID: Kathy Werner, female   DOB: March 01, 1976, 42 y.o.   MRN: [id number]         Chief Complaint: Post UFE  Referring Physician(s): Haygood  History of Present Illness: Kathy Werner is a 42 y.o. female with past medical history significant for anemia who underwent a technically successful bilateral uterine artery embolization for symptomatic uterine fibroids on 01/03/2018. Patient returns today to the interventional radiology clinic for postprocedural evaluation and management. She is unaccompanied and serves as her own historian.  In brief review, patient's primary fibroid related complaint was in regards to her menorrhagia though she also complained of bulk symptoms including urinary frequency, urgency as well as back pain and abdominal/pelvic pain and pressure.  Pelvic MRI performed 12/16/2017 demonstrated an enlarged myomatous uterus with multiple enhancing fibroids. For this, the patient underwent technically successful bilateral uterine artery embolization on 01/03/2018.  Patient states that while in the hospital, she experienced worsening back pain which radiated to her right lower extremity. Patient was discharged the following day otherwise without incident.  Patient states that her back pain has improved with a short course of steroids as well as tramadol. Patient continues to complain of mild right lower extremity numbness though states overall her right lower extremity symptoms are improved.  In regards to her fibroids, the patient has experienced one cycle since undergoing the embolization. She states this cycle was much more manageable only lasting 3 days not associated with severe menorrhagia.  Patient is otherwise without complaint. Specifically, no fever or chills. No change in bladder or bowel function.  Past Medical History:  Diagnosis Date  . Anemia    receiving iron infusion 01/07/2017  . Fibroid   . Menorrhagia 03/2014  . Migraines   . Vaginal Pap smear,  abnormal    now nl since L:EEP2002    Past Surgical History:  Procedure Laterality Date  . DILATATION & CURETTAGE/HYSTEROSCOPY WITH MYOSURE N/A 02/17/2015   Procedure: DILATATION & CURETTAGE/HYSTEROSCOPY WITH MYOSURE;  Surgeon: Eldred Manges, MD;  Location: [place] [facility];  Service: Gynecology;  Laterality: N/A;  . DILATATION & CURETTAGE/HYSTEROSCOPY WITH MYOSURE N/A 01/11/2017   Procedure: DILATATION & CURETTAGE/HYSTEROSCOPY WITH MYOSURE, 2.9 RESECTION OF  FIBROID;  Surgeon: Eldred Manges, MD;  Location: [place] [facility];  Service: Gynecology;  Laterality: N/A;  . DILITATION & CURRETTAGE/HYSTROSCOPY WITH HYDROTHERMAL ABLATION N/A 01/11/2017   Procedure: DILATATION & CURETTAGE/HYSTEROSCOPY WITH HYDROTHERMAL ABLATION;  Surgeon: Eldred Manges, MD;  Location: [place] [facility];  Service: Gynecology;  Laterality: N/A;  Dr. Leo Grosser is asking that the HTA be completed before the resection  [place] HTA rep will be here   . IR ANGIOGRAM PELVIS SELECTIVE OR SUPRASELECTIVE  01/03/2018  . IR ANGIOGRAM PELVIS SELECTIVE OR SUPRASELECTIVE  01/03/2018  . IR ANGIOGRAM SELECTIVE EACH ADDITIONAL VESSEL  01/03/2018  . IR ANGIOGRAM SELECTIVE EACH ADDITIONAL VESSEL  01/03/2018  . IR EMBO TUMOR ORGAN ISCHEMIA INFARCT INC GUIDE ROADMAPPING  01/03/2018  . IR RADIOLOGIST EVAL & MGMT  12/15/2017  . IR US GUIDE VASC ACCESS RIGHT  01/03/2018  . IUD REMOVAL  06/2013   due to worsening dysmenorrhea  . MIRENA     Inserted 01-24-13  . WISDOM TOOTH EXTRACTION      Allergies: Patient has no known allergies.  Medications: Prior to Admission medications   Medication Sig Start Date End Date Taking? Authorizing Provider  NUVARING 0.12-0.015 MG/24HR vaginal ring Place 1 Device vaginally every 28 (twenty-eight) days. Insert for  21 days, remove for 7 days, repeat 02/07/17  Yes [provider]  docusate sodium (COLACE) 100 MG capsule TAKE 1 CAPSULE BY MOUTH TWICE A DAY AS NEEDED FOR CONSTIPATION 01/04/18   [provider]  HYDROcodone-acetaminophen (NORCO/VICODIN) 5-325 MG tablet TAKE 1-2 TABLETS EVERY 4-6 HOURS AS NEEDED FOR MODERATE TO SEVERE PAIN 01/04/18   [provider]  ibuprofen (ADVIL,MOTRIN) 600 MG tablet Take ibuprofen 600 mg orally every 6 hours for 3 days and then every 6 hours as needed for pain Patient not taking: Reported on 02/15/2018 01/11/17   Haygood, Seymour Bars, MD  ondansetron (ZOFRAN) 8 MG tablet TAKE 1 TABLET BY MOUTH TWICE A DAY AS NEEDED FOR NAUSEA 01/04/18   [provider]     Family History  Problem Relation Age of Onset  . Hypertension Paternal Grandmother   . Sudden death Neg Hx   . Hyperlipidemia Neg Hx   . Heart attack Neg Hx   . Diabetes Neg Hx     Social History   Socioeconomic History  . Marital status: Single    Spouse name: Not on file  . Number of children: Not on file  . Years of education: Not on file  . Highest education level: Not on file  Social Needs  . Financial resource strain: Not on file  . Food insecurity - worry: Not on file  . Food insecurity - inability: Not on file  . Transportation needs - medical: Not on file  . Transportation needs - non-medical: Not on file  Occupational History  . Not on file  Tobacco Use  . Smoking status: Never Smoker  . Smokeless tobacco: Never Used  Substance and Sexual Activity  . Alcohol use: Yes    Comment: occ  . Drug use: No  . Sexual activity: Yes    Partners: Male    Birth control/protection: Condom, Pill    Comment: Mirena inserted 01-24-13 removed 2014  Other Topics Concern  . Not on file  Social History Narrative  . Not on file    ECOG Status: 1 - Symptomatic but completely ambulatory  Review of Systems: A 12 point ROS discussed and pertinent positives are indicated in the HPI above.  All other systems are negative.  Review of Systems  Constitutional: Positive for activity change. Negative for appetite change, fatigue and fever.  Respiratory: Negative.     Cardiovascular: Negative.   Gastrointestinal: Negative.   Genitourinary: Negative.   Musculoskeletal: Positive for back pain.       Patient complains of right lower extremity numbness.    Vital Signs: BP 119/75   Pulse 78   Temp 98.8 F (37.1 C) (Oral)   Resp 16   Ht 5\' 3"  (1.6 m)   Wt 247 lb (112 kg)   LMP 02/03/2018   BMI 43.75 kg/m   Physical Exam  Constitutional: She appears well-developed and well-nourished.  HENT:  Head: Normocephalic and atraumatic.  Cardiovascular: Intact distal pulses.  Well healed right groin access site.  Nursing note and vitals reviewed.   Imaging:  Selected images from preprocedural contrast enhanced pelvic MRI performed 12/16/2017 as well as intraprocedural images during bilateral uterine artery embolization performed 01/03/2018 were reviewed in detail with the patient.  Labs:  CBC: Recent Labs    01/03/18 0755  WBC 8.0  HGB 13.4  HCT 40.2  PLT 236    COAGS: Recent Labs    01/03/18 0755  INR 1.06    BMP: Recent Labs  01/03/18 0755  NA 137  K 4.1  CL 109  CO2 22  GLUCOSE 94  BUN 16  CALCIUM 9.2  CREATININE 0.91  GFRNONAA >60  GFRAA >60    LIVER FUNCTION TESTS: No results for input(s): BILITOT, AST, ALT, ALKPHOS, PROT, ALBUMIN in the last 8760 hours.  TUMOR MARKERS: No results for input(s): AFPTM, CEA, CA199, CHROMGRNA in the last 8760 hours.  Assessment and Plan:  Kathy Werner is a 42 y.o. female with past medical history significant for anemia who underwent a technically successful bilateral uterine artery embolization for symptomatic uterine fibroids on 01/03/2018.   Selected images from preprocedural contrast enhanced pelvic MRI performed 12/16/2017 as well as intraprocedural images during bilateral uterine artery embolization performed 01/03/2018 were reviewed in detail with the patient.  Patient states that while in the hospital, she experienced worsening back pain which radiated to her right lower  extremity.  I am uncertain whether this symptom is related to our procedure, however I am pleased that her symptoms appear to have improved and are now nearly resolved.   I am pleased to hear that her first cycle since undergoing the embolization was associated with significant reduction in her preprocedural menorrhagia.  I explained that her next several cycles may be variable including intraprocedural spotting as well as recurrence of menorrhagia, though I am hopeful that within approximately 3-6 months following the procedure she will notice a durable significant reduction in her fibroid related symptoms.  The patient will be seen in follow-up consultation in approximately 6 months following the procedure (late June/early July).  If the patient's symptoms are resolved, additional imaging will not be necessary.  Patient was encouraged to call the interventional radiology clinic with any interval questions or concerns.  A copy of this report was sent to the requesting provider on this date.  Electronically Signed: Sandi Mariscal 02/15/2018, 11:29 AM   I spent a total of 15 Minutes in face to face in clinical consultation, greater than 50% of which was counseling/coordinating care for post UFE

## 2018-06-29 ENCOUNTER — Other Ambulatory Visit (HOSPITAL_COMMUNITY): Payer: Self-pay | Admitting: Interventional Radiology

## 2018-06-29 DIAGNOSIS — D219 Benign neoplasm of connective and other soft tissue, unspecified: Secondary | ICD-10-CM

## 2018-07-19 ENCOUNTER — Ambulatory Visit
Admission: RE | Admit: 2018-07-19 | Discharge: 2018-07-19 | Disposition: A | Discharge status: Home / Self Care | Payer: 59 | Source: Ambulatory Visit | Attending: Interventional Radiology | Admitting: Interventional Radiology

## 2018-07-19 ENCOUNTER — Encounter: Payer: Self-pay | Admitting: Radiology

## 2018-07-19 DIAGNOSIS — D219 Benign neoplasm of connective and other soft tissue, unspecified: Secondary | ICD-10-CM

## 2018-07-19 HISTORY — PX: IR RADIOLOGIST EVAL & MGMT: IMG5224

## 2018-07-19 NOTE — Progress Notes (Signed)
Patient ID: Kathy Werner, female   DOB: [birth year], 42 y.o.   MRN: [id number]         Chief Complaint: 7 month follow up post UFE  Referring Physician(s): Haygood  History of Present Illness: Kathy Werner is a 42 y.o. female with past medical history significant for anemia who underwent successful bilateral uterine artery embolization for symptomatic uterine fibroids on 01/03/2018.  She returns to the interventional radiology clinic today for postprocedural evaluation and management.  She is unaccompanied and serves as her own historian.  Patient's main fibroid related complaint was in regards to her severe menorrhagia.  Prior to the procedure, the patient reported her cycles lasted approximately 21 days and included the passing of clots necessitating the changing of pads every 2 hours.  She also was found to be anemic and had received previous blood and iron transfusions.  Patient states that since undergoing the uterine fibroid embolization her cycles are much improved only lasting approximately 5 days and only necessitating the changing of pads twice per day.  She denies intraprocedural spotting or bleeding.  Prior to the procedure patient did report nonspecific bulk symptoms including urinary frequency, urgency, abdominal and pelvic pain and pressure as well as back pain and states that overall these symptoms are unchanged since undergoing the embolization that does not feel the symptoms are associated with her fibroids.  Patient does report transient bilateral groin pain she states is not associated with her cycle and is unable to state its frequency though estimate it occurs about once every 2 weeks only lasting for several seconds.  Patient is otherwise without complaint.  Specifically, no fever or chills.    The patient is overall very pleased with the procedure.  Past Medical History:  Diagnosis Date  . Anemia    receiving iron infusion 01/07/2017  . Fibroid   . Menorrhagia  03/2014  . Migraines   . Vaginal Pap smear, abnormal    now nl since L:EEP2002    Past Surgical History:  Procedure Laterality Date  . DILATATION & CURETTAGE/HYSTEROSCOPY WITH MYOSURE N/A 02/17/2015   Procedure: DILATATION & CURETTAGE/HYSTEROSCOPY WITH MYOSURE;  Surgeon: Eldred Manges, MD;  Location: [facility];  Service: Gynecology;  Laterality: N/A;  . DILATATION & CURETTAGE/HYSTEROSCOPY WITH MYOSURE N/A 01/11/2017   Procedure: DILATATION & CURETTAGE/HYSTEROSCOPY WITH MYOSURE, 2.9 RESECTION OF  FIBROID;  Surgeon: Eldred Manges, MD;  Location: [facility];  Service: Gynecology;  Laterality: N/A;  . DILITATION & CURRETTAGE/HYSTROSCOPY WITH HYDROTHERMAL ABLATION N/A 01/11/2017   Procedure: DILATATION & CURETTAGE/HYSTEROSCOPY WITH HYDROTHERMAL ABLATION;  Surgeon: Eldred Manges, MD;  Location: [facility];  Service: Gynecology;  Laterality: N/A;  Dr. Leo Grosser is asking that the HTA be completed before the resection  [facility] HTA rep will be here   . IR ANGIOGRAM PELVIS SELECTIVE OR SUPRASELECTIVE  01/03/2018  . IR ANGIOGRAM PELVIS SELECTIVE OR SUPRASELECTIVE  01/03/2018  . IR ANGIOGRAM SELECTIVE EACH ADDITIONAL VESSEL  01/03/2018  . IR ANGIOGRAM SELECTIVE EACH ADDITIONAL VESSEL  01/03/2018  . IR EMBO TUMOR ORGAN ISCHEMIA INFARCT INC GUIDE ROADMAPPING  01/03/2018  . IR RADIOLOGIST EVAL & MGMT  12/15/2017  . IR RADIOLOGIST EVAL & MGMT  02/15/2018  . IR RADIOLOGIST EVAL & MGMT  07/19/2018  . IR US GUIDE VASC ACCESS RIGHT  01/03/2018  . IUD REMOVAL  06/2013   due to worsening dysmenorrhea  . MIRENA     Inserted 01-24-13  . WISDOM TOOTH EXTRACTION  Allergies: Patient has no known allergies.  Medications: Prior to Admission medications   Medication Sig Start Date End Date Taking? Authorizing Provider  ibuprofen (ADVIL,MOTRIN) 600 MG tablet Take ibuprofen 600 mg orally every 6 hours for 3 days and then every 6 hours as needed for pain 01/11/17  Yes Haygood, Seymour Bars, MD    Multiple Vitamin (MULTIVITAMIN) tablet Take 1 tablet by mouth daily.   Yes [provider]  NUVARING 0.12-0.015 MG/24HR vaginal ring Place 1 Device vaginally every 28 (twenty-eight) days. Insert for 21 days, remove for 7 days, repeat 02/07/17  Yes [provider]  ondansetron (ZOFRAN) 8 MG tablet TAKE 1 TABLET BY MOUTH TWICE A DAY AS NEEDED FOR NAUSEA 01/04/18   [provider]     Family History  Problem Relation Age of Onset  . Hypertension Paternal Grandmother   . Sudden death Neg Hx   . Hyperlipidemia Neg Hx   . Heart attack Neg Hx   . Diabetes Neg Hx     Social History   Socioeconomic History  . Marital status: Single    Spouse name: Not on file  . Number of children: Not on file  . Years of education: Not on file  . Highest education level: Not on file  Occupational History  . Not on file  Social Needs  . Financial resource strain: Not on file  . Food insecurity:    Worry: Not on file    Inability: Not on file  . Transportation needs:    Medical: Not on file    Non-medical: Not on file  Tobacco Use  . Smoking status: Never Smoker  . Smokeless tobacco: Never Used  Substance and Sexual Activity  . Alcohol use: Yes    Comment: occ  . Drug use: No  . Sexual activity: Yes    Partners: Male    Birth control/protection: Condom, Pill    Comment: Mirena inserted 01-24-13 removed 2014  Lifestyle  . Physical activity:    Days per week: Not on file    Minutes per session: Not on file  . Stress: Not on file  Relationships  . Social connections:    Talks on phone: Not on file    Gets together: Not on file    Attends religious service: Not on file    Active member of club or organization: Not on file    Attends meetings of clubs or organizations: Not on file    Relationship status: Not on file  Other Topics Concern  . Not on file  Social History Narrative  . Not on file    ECOG Status: 0 - Asymptomatic  Review of Systems: A 12 point  ROS discussed and pertinent positives are indicated in the HPI above.  All other systems are negative.  Review of Systems  Constitutional: Negative for activity change, appetite change, fatigue and fever.  Genitourinary: Positive for frequency and urgency. Negative for flank pain and hematuria.       Nonspecific transient bilateral groin pain.  Musculoskeletal: Positive for back pain.    Vital Signs: BP 120/78   Pulse 74   Temp 98.4 F (36.9 C) (Oral)   Resp 14   Ht 5\' 3"  (1.6 m)   Wt 252 lb (114.3 kg)   LMP 07/09/2018 (Exact Date)   SpO2 100%   BMI 44.64 kg/m   Physical Exam  Cardiovascular: Intact distal pulses.  Abdominal:  There is no palpable abnormality at patient's discomfort within the bilateral groins.  I am unable to reproduce the patient's symptoms with palpation.  I do not palpate a pseudoaneurysm.  There is no bruising.    Mallampati Score:     Imaging:  Selected images from intraprocedural uterine fibroid embolization performed 01/03/2018 as well as preprocedural contrast-enhanced pelvic MRI performed 12/16/2017 reviewed in detail.  Ir Radiologist Eval & Mgmt  Result Date: 07/19/2018 Please refer to notes tab for details about interventional procedure. (Op Note)   Labs:  CBC: Recent Labs    01/03/18 0755  WBC 8.0  HGB 13.4  HCT 40.2  PLT 236    COAGS: Recent Labs    01/03/18 0755  INR 1.06    BMP: Recent Labs    01/03/18 0755  NA 137  K 4.1  CL 109  CO2 22  GLUCOSE 94  BUN 16  CALCIUM 9.2  CREATININE 0.91  GFRNONAA >60  GFRAA >60    LIVER FUNCTION TESTS: No results for input(s): BILITOT, AST, ALT, ALKPHOS, PROT, ALBUMIN in the last 8760 hours.  TUMOR MARKERS: No results for input(s): AFPTM, CEA, CA199, CHROMGRNA in the last 8760 hours.  Assessment and Plan:  Kathy Werner is a 42 y.o. female with past medical history significant for anemia who underwent successful bilateral uterine artery embolization for symptomatic  uterine fibroids on 01/03/2018.  Selected images from intraprocedural uterine fibroid embolization performed 01/03/2018 as well as preprocedural contrast-enhanced pelvic MRI performed 12/16/2017 reviewed in detail.  Patient is overall very pleased with the procedure reporting significant improvement in her preprocedural menorrhagia.  She continues to complain of some bulk symptoms including urinary frequency, urgency, abdominal and pelvic pain, however feels these symptoms are likely not associated with her fibroids.  Patient reports transient bilateral groin pain which is not associated with her cycle and is very transient only lasting several seconds and occurring possibly once every 2 weeks.  On physical examination, there is no correlate for the patient's symptoms, specifically, there is no access site related complication.  As such, I am uncertain what is causing the patient's symptoms though I do not feel it is associated with either her fibroids or the uterine fibroid embolization.  I explained to the patient that given her young age there is a chance she could re-grow symptomatic fibroids prior to the onset of menopause, though I am hopeful our procedure will give her a durable result until this time.  Given patient's marked improvement in her menorrhagia, I will not obtain postprocedural imaging.  Patient was instructed to call the interventional radiology clinic with any future questions or concerns only otherwise follow-up on a as needed basis  A copy of this report was sent to the requesting provider on this date.  Electronically Signed: Sandi Mariscal 07/19/2018, 12:46 PM   I spent a total of 15 Minutes in face to face in clinical consultation, greater than 50% of which was counseling/coordinating care for post UFE

## 2019-02-08 DIAGNOSIS — N719 Inflammatory disease of uterus, unspecified: Secondary | ICD-10-CM | POA: Insufficient documentation

## 2019-03-05 ENCOUNTER — Telehealth: Payer: Self-pay | Admitting: Podiatry

## 2019-03-05 NOTE — Telephone Encounter (Signed)
Patient is experiencing severe heel pain and would like to know what she can do for pain in the meantime. Please give patient a call.

## 2019-03-05 NOTE — Telephone Encounter (Signed)
I informed pt due to the CDC requiredments at this time our office is only seeing emergency pts, that she should ice the area 3-4 times a day for 15-20 minutes/session, take an antiinflammatory that she is able to tolerate as instructed on the bottle, rest. Pt states she did get relief after taking 800mg  of ibuprofen last night. I told pt she can take ibuprofen 3 times a day.

## 2019-03-14 ENCOUNTER — Other Ambulatory Visit: Payer: Self-pay

## 2019-03-14 ENCOUNTER — Ambulatory Visit (INDEPENDENT_AMBULATORY_CARE_PROVIDER_SITE_OTHER): Payer: 59

## 2019-03-14 ENCOUNTER — Ambulatory Visit: Payer: 59 | Admitting: Podiatry

## 2019-03-14 ENCOUNTER — Encounter: Payer: Self-pay | Admitting: Podiatry

## 2019-03-14 VITALS — Temp 98.3°F

## 2019-03-14 DIAGNOSIS — M722 Plantar fascial fibromatosis: Secondary | ICD-10-CM

## 2019-03-14 DIAGNOSIS — M79672 Pain in left foot: Secondary | ICD-10-CM

## 2019-03-14 MED ORDER — MELOXICAM 15 MG PO TABS: 15.0000 mg | ORAL_TABLET | Freq: Every day | ORAL | 1 refills | Status: DC

## 2019-03-14 NOTE — Progress Notes (Signed)
   Subjective: 43 y.o. female presents the office today for new complaint regarding left heel pain that is been going on for approximately 2 weeks now.  Patient denies injury.  She states that she cannot put her left heel on the ground and it hurts too much.  She does have a history of plantar fasciitis and she received an injection approximately 4-5 years ago which alleviated her symptoms.  She is also been taking over-the-counter ibuprofen to alleviate the pain.  She presents for further treatment and evaluation   Past Medical History:  Diagnosis Date  . Anemia    receiving iron infusion 01/07/2017  . Fibroid   . Menorrhagia 03/2014  . Migraines   . Vaginal Pap smear, abnormal    now nl since L:EEP2002     Objective: Physical Exam General: The patient is alert and oriented x3 in no acute distress.  Dermatology: Skin is warm, dry and supple bilateral lower extremities. Negative for open lesions or macerations bilateral.   Vascular: Dorsalis Pedis and Posterior Tibial pulses palpable bilateral.  Capillary fill time is immediate to all digits.  Neurological: Epicritic and protective threshold intact bilateral.   Musculoskeletal: Tenderness to palpation to the plantar aspect of the left heel along the plantar fascia. All other joints range of motion within normal limits bilateral. Strength 5/5 in all groups bilateral.   Radiographic exam: Normal osseous mineralization. Joint spaces preserved. No fracture/dislocation/boney destruction. No other soft tissue abnormalities or radiopaque foreign bodies.   Assessment: 1. Plantar fasciitis left foot  Plan of Care:  1. Patient evaluated. Xrays reviewed.   2. Injection of 0.5cc Celestone soluspan injected into the left plantar fascia.  3.  Patient recently finished Medrol Dosepak for right lower extremity sciatica 4. Rx for Meloxicam ordered for patient. 5. Plantar fascial brace and a night splint was dispensed today  6. Instructed patient  regarding therapies and modalities at home to alleviate symptoms.  7. Return to clinic in 4 weeks.     Edrick Kins, DPM [facility]  Dr. Edrick Kins, DPM    [address], [place] [address]                Office [phone]  Fax [phone]

## 2019-04-11 ENCOUNTER — Other Ambulatory Visit: Payer: Self-pay

## 2019-04-11 ENCOUNTER — Ambulatory Visit: Payer: 59 | Admitting: Podiatry

## 2019-04-11 VITALS — Temp 97.3°F

## 2019-04-11 DIAGNOSIS — M79672 Pain in left foot: Secondary | ICD-10-CM | POA: Diagnosis not present

## 2019-04-11 DIAGNOSIS — M722 Plantar fascial fibromatosis: Secondary | ICD-10-CM | POA: Diagnosis not present

## 2019-04-11 NOTE — Progress Notes (Signed)
   Subjective: 43 y.o. female presents the office today for follow-up evaluation regarding left heel pain.  Last visit on 03/14/2019 she received an anti-inflammatory injection and she is currently taking meloxicam.  She notices significant improvement.   Past Medical History:  Diagnosis Date  . Anemia    receiving iron infusion 01/07/2017  . Fibroid   . Menorrhagia 03/2014  . Migraines   . Vaginal Pap smear, abnormal    now nl since L:EEP2002     Objective: Physical Exam General: The patient is alert and oriented x3 in no acute distress.  Dermatology: Skin is warm, dry and supple bilateral lower extremities. Negative for open lesions or macerations bilateral.   Vascular: Dorsalis Pedis and Posterior Tibial pulses palpable bilateral.  Capillary fill time is immediate to all digits.  Neurological: Epicritic and protective threshold intact bilateral.   Musculoskeletal: Tenderness to palpation to the plantar aspect of the left heel along the plantar fascia. All other joints range of motion within normal limits bilateral. Strength 5/5 in all groups bilateral.    Assessment: 1. Plantar fasciitis left foot-improved  Plan of Care:  1. Patient evaluated.  2.  Patient declined injection today. 3.  Continue meloxicam daily 4.  Recommend OTC power step insoles from [employer] since the sports doors are closed to the COVID-19 pandemic 5.  Continue wearing night splint 6.  Discontinue plantar fascial brace since it did not help to alleviate her symptoms 7.  Continue good supportive sneakers.  Patient is going to try a pair of new balance shoes to see if that helps alleviate symptoms 8.  Return to clinic as needed  Edrick Kins, DPM [facility]  Dr. Edrick Kins, DPM    [address],  [address]                Office [phone]  Fax [phone]

## 2019-06-06 ENCOUNTER — Emergency Department (HOSPITAL_COMMUNITY)
Admission: EM | Admit: 2019-06-06 | Discharge: 2019-06-07 | Discharge status: Against Medical Advice | Payer: 59 | Attending: Emergency Medicine | Admitting: Emergency Medicine

## 2019-06-06 ENCOUNTER — Encounter (HOSPITAL_COMMUNITY): Payer: Self-pay | Admitting: Emergency Medicine

## 2019-06-06 ENCOUNTER — Emergency Department (HOSPITAL_COMMUNITY): Payer: 59

## 2019-06-06 ENCOUNTER — Other Ambulatory Visit: Payer: Self-pay

## 2019-06-06 DIAGNOSIS — R0789 Other chest pain: Secondary | ICD-10-CM | POA: Diagnosis present

## 2019-06-06 DIAGNOSIS — Z5321 Procedure and treatment not carried out due to patient leaving prior to being seen by health care provider: Secondary | ICD-10-CM | POA: Insufficient documentation

## 2019-06-06 LAB — I-STAT BETA HCG BLOOD, ED (MC, WL, AP ONLY): I-stat hCG, quantitative: <5 m[IU]/mL (ref <5)

## 2019-06-06 MED ORDER — SODIUM CHLORIDE 0.9% FLUSH: 3.0000 mL | Freq: Once | INTRAVENOUS | Status: DC

## 2019-06-06 NOTE — ED Triage Notes (Signed)
Pt c/o chest pain and dry mouth after eating an edible today. Also c/o a headaches x 2 weeks. Pt tearful in triage, reports that she had a recent death in the family.

## 2019-06-07 NOTE — ED Notes (Signed)
Pt seen leaving ed. Walks up parking lot hill.

## 2019-10-03 ENCOUNTER — Other Ambulatory Visit: Payer: Self-pay

## 2019-10-03 ENCOUNTER — Encounter: Payer: Self-pay | Admitting: Family Medicine

## 2019-10-03 ENCOUNTER — Ambulatory Visit (INDEPENDENT_AMBULATORY_CARE_PROVIDER_SITE_OTHER): Payer: 59 | Admitting: Family Medicine

## 2019-10-03 VITALS — BP 124/80 | HR 83 | Temp 98.4°F | Ht 63.0 in | Wt 276.0 lb

## 2019-10-03 DIAGNOSIS — Z0001 Encounter for general adult medical examination with abnormal findings: Secondary | ICD-10-CM

## 2019-10-03 DIAGNOSIS — Z6841 Body Mass Index (BMI) 40.0 and over, adult: Secondary | ICD-10-CM

## 2019-10-03 DIAGNOSIS — Z2821 Immunization not carried out because of patient refusal: Secondary | ICD-10-CM | POA: Diagnosis not present

## 2019-10-03 DIAGNOSIS — Z0000 Encounter for general adult medical examination without abnormal findings: Secondary | ICD-10-CM

## 2019-10-03 DIAGNOSIS — Z7689 Persons encountering health services in other specified circumstances: Secondary | ICD-10-CM

## 2019-10-03 DIAGNOSIS — Z1231 Encounter for screening mammogram for malignant neoplasm of breast: Secondary | ICD-10-CM

## 2019-10-03 LAB — BASIC METABOLIC PANEL
BUN: 12 mg/dL (ref 6–23)
CO2: 28 mEq/L (ref 19–32)
Calcium: 9.7 mg/dL (ref 8.4–10.5)
Chloride: 103 mEq/L (ref 96–112)
Creatinine, Ser: 0.83 mg/dL (ref 0.40–1.20)
GFR: 91.72 mL/min (ref >60.00)
Glucose, Bld: 96 mg/dL (ref 70–99)
Potassium: 4.4 mEq/L (ref 3.5–5.1)
Sodium: 138 mEq/L (ref 135–145)

## 2019-10-03 LAB — LIPID PANEL
Cholesterol: 217 mg/dL — ABNORMAL HIGH (ref 0–200)
HDL: 50.1 mg/dL (ref >39.00)
LDL Cholesterol: 128 mg/dL — ABNORMAL HIGH (ref 0–99)
NonHDL: 167.26
Total CHOL/HDL Ratio: 4
Triglycerides: 198 mg/dL — ABNORMAL HIGH (ref 0.0–149.0)
VLDL: 39.6 mg/dL (ref 0.0–40.0)

## 2019-10-03 LAB — ALT: ALT: 22 U/L (ref 0–35)

## 2019-10-03 LAB — AST: AST: 18 U/L (ref 0–37)

## 2019-10-03 LAB — TSH: TSH: 3.01 u[IU]/mL (ref 0.35–4.50)

## 2019-10-03 LAB — VITAMIN D 25 HYDROXY (VIT D DEFICIENCY, FRACTURES): VITD: 16.23 ng/mL — ABNORMAL LOW (ref 30.00–100.00)

## 2019-10-03 NOTE — Patient Instructions (Addendum)
[facility]  Health Maintenance, Female Adopting a healthy lifestyle and getting preventive care are important in promoting health and wellness. Ask your health care provider about:  The right schedule for you to have regular tests and exams.  Things you can do on your own to prevent diseases and keep yourself healthy. What should I know about diet, weight, and exercise? Eat a healthy diet   Eat a diet that includes plenty of vegetables, fruits, low-fat dairy products, and lean protein.  Do not eat a lot of foods that are high in solid fats, added sugars, or sodium. Maintain a healthy weight Body mass index (BMI) is used to identify weight problems. It estimates body fat based on height and weight. Your health care provider can help determine your BMI and help you achieve or maintain a healthy weight. Get regular exercise Get regular exercise. This is one of the most important things you can do for your health. Most adults should:  Exercise for at least 150 minutes each week. The exercise should increase your heart rate and make you sweat (moderate-intensity exercise).  Do strengthening exercises at least twice a week. This is in addition to the moderate-intensity exercise.  Spend less time sitting. Even light physical activity can be beneficial. Watch cholesterol and blood lipids Have your blood tested for lipids and cholesterol at 41 years of age, then have this test every 5 years. Have your cholesterol levels checked more often if:  Your lipid or cholesterol levels are high.  You are older than 41 years of age.  You are at high risk for heart disease. What should I know about cancer screening? Depending on your health history and family history, you may need to have cancer screening at various ages. This may include screening for:  Breast cancer.  Cervical cancer.  Colorectal cancer.  Skin cancer.  Lung cancer. What should I know about heart  disease, diabetes, and high blood pressure? Blood pressure and heart disease  High blood pressure causes heart disease and increases the risk of stroke. This is more likely to develop in people who have high blood pressure readings, are of African descent, or are overweight.  Have your blood pressure checked: ? Every 3-5 years if you are 98-54 years of age. ? Every year if you are 13 years old or older. Diabetes Have regular diabetes screenings. This checks your fasting blood sugar level. Have the screening done:  Once every three years after age 30 if you are at a normal weight and have a low risk for diabetes.  More often and at a younger age if you are overweight or have a high risk for diabetes. What should I know about preventing infection? Hepatitis B If you have a higher risk for hepatitis B, you should be screened for this virus. Talk with your health care provider to find out if you are at risk for hepatitis B infection. Hepatitis C Testing is recommended for:  Everyone born from [birth year] through 1963.  Anyone with known risk factors for hepatitis C. Sexually transmitted infections (STIs)  Get screened for STIs, including gonorrhea and chlamydia, if: ? You are sexually active and are younger than 41 years of age. ? You are older than 41 years of age and your health care provider tells you that you are at risk for this type of infection. ? Your sexual activity has changed since you were last screened, and you are at increased risk for chlamydia or  gonorrhea. Ask your health care provider if you are at risk.  Ask your health care provider about whether you are at high risk for HIV. Your health care provider may recommend a prescription medicine to help prevent HIV infection. If you choose to take medicine to prevent HIV, you should first get tested for HIV. You should then be tested every 3 months for as long as you are taking the medicine. Pregnancy  If you are about to stop  having your period (premenopausal) and you may become pregnant, seek counseling before you get pregnant.  Take 400 to 800 micrograms (mcg) of folic acid every day if you become pregnant.  Ask for birth control (contraception) if you want to prevent pregnancy. Osteoporosis and menopause Osteoporosis is a disease in which the bones lose minerals and strength with aging. This can result in bone fractures. If you are 80 years old or older, or if you are at risk for osteoporosis and fractures, ask your health care provider if you should:  Be screened for bone loss.  Take a calcium or vitamin D supplement to lower your risk of fractures.  Be given hormone replacement therapy (HRT) to treat symptoms of menopause. Follow these instructions at home: Lifestyle  Do not use any products that contain nicotine or tobacco, such as cigarettes, e-cigarettes, and chewing tobacco. If you need help quitting, ask your health care provider.  Do not use street drugs.  Do not share needles.  Ask your health care provider for help if you need support or information about quitting drugs. Alcohol use  Do not drink alcohol if: ? Your health care provider tells you not to drink. ? You are pregnant, may be pregnant, or are planning to become pregnant.  If you drink alcohol: ? Limit how much you use to 0-1 drink a day. ? Limit intake if you are breastfeeding.  Be aware of how much alcohol is in your drink. In the [place] one drink equals one 12 oz bottle of beer (355 mL), one 5 oz glass of wine (148 mL), or one 1 oz glass of hard liquor (44 mL). General instructions  Schedule regular health, dental, and eye exams.  Stay current with your vaccines.  Tell your health care provider if: ? You often feel depressed. ? You have ever been abused or do not feel safe at home. Summary  Adopting a healthy lifestyle and getting preventive care are important in promoting health and wellness.  Follow your health  care provider's instructions about healthy diet, exercising, and getting tested or screened for diseases.  Follow your health care provider's instructions on monitoring your cholesterol and blood pressure. This information is not intended to replace advice given to you by your health care provider. Make sure you discuss any questions you have with your health care provider. Document Released: 06/14/2011 Document Revised: 11/22/2018 Document Reviewed: 11/22/2018 Elsevier Patient Education  2020 [employer].

## 2019-10-03 NOTE — Progress Notes (Signed)
Kathy Werner is a 43 y.o. female  Chief Complaint  Patient presents with  . Establish Care    HPI: Kathy Werner is a 43 y.o. female here to establish care with our office. She works for [employer], has twin (boy & girl) 21yo children.  She is due for CPE and is fasting for labs.  She follows with GYN.   Pt notes significant weight gain (20-25lbs) since 02/2019 when she started working from home d/t covid pandemic. She endorses generalized breast soreness, increased heartburn/reflux.  Exercise: pt started walking 1 week ago Diet: more snacking as she is working from home; 2 cans of soda per day and does drink water as well; she started intermittent fasting this week in attempt to lose weight  Last PAP: UTD Last mammo: due, never had one Tdap - UTD - pt states she has had one in the past 10 years Declines flu vaccine Vision - feels she has to squint to read her phone, computer - mostly close up and sometimes distance (driving at night) Dentist - UTD Chemical engineer)  Med refills needed today: none   Past Medical History:  Diagnosis Date  . Anemia    receiving iron infusion 01/07/2017  . Fibroid   . Menorrhagia 03/2014  . Migraines   . Vaginal Pap smear, abnormal    now nl since L:EEP2002    Past Surgical History:  Procedure Laterality Date  . DILATATION & CURETTAGE/HYSTEROSCOPY WITH MYOSURE N/A 02/17/2015   Procedure: DILATATION & CURETTAGE/HYSTEROSCOPY WITH MYOSURE;  Surgeon: Eldred Manges, MD;  Location: [facility];  Service: Gynecology;  Laterality: N/A;  . DILATATION & CURETTAGE/HYSTEROSCOPY WITH MYOSURE N/A 01/11/2017   Procedure: DILATATION & CURETTAGE/HYSTEROSCOPY WITH MYOSURE, 2.9 RESECTION OF  FIBROID;  Surgeon: Eldred Manges, MD;  Location: [facility];  Service: Gynecology;  Laterality: N/A;  . DILITATION & CURRETTAGE/HYSTROSCOPY WITH HYDROTHERMAL ABLATION N/A 01/11/2017   Procedure: DILATATION & CURETTAGE/HYSTEROSCOPY WITH HYDROTHERMAL ABLATION;  Surgeon:  Eldred Manges, MD;  Location: [facility];  Service: Gynecology;  Laterality: N/A;  Dr. Leo Grosser is asking that the HTA be completed before the resection  [place] HTA rep will be here   . IR ANGIOGRAM PELVIS SELECTIVE OR SUPRASELECTIVE  01/03/2018  . IR ANGIOGRAM PELVIS SELECTIVE OR SUPRASELECTIVE  01/03/2018  . IR ANGIOGRAM SELECTIVE EACH ADDITIONAL VESSEL  01/03/2018  . IR ANGIOGRAM SELECTIVE EACH ADDITIONAL VESSEL  01/03/2018  . IR EMBO TUMOR ORGAN ISCHEMIA INFARCT INC GUIDE ROADMAPPING  01/03/2018  . IR RADIOLOGIST EVAL & MGMT  12/15/2017  . IR RADIOLOGIST EVAL & MGMT  02/15/2018  . IR RADIOLOGIST EVAL & MGMT  07/19/2018  . IR US GUIDE VASC ACCESS RIGHT  01/03/2018  . IUD REMOVAL  06/2013   due to worsening dysmenorrhea  . MIRENA     Inserted 01-24-13  . WISDOM TOOTH EXTRACTION      Social History   Socioeconomic History  . Marital status: Single    Spouse name: Not on file  . Number of children: Not on file  . Years of education: Not on file  . Highest education level: Not on file  Occupational History  . Not on file  Social Needs  . Financial resource strain: Not on file  . Food insecurity    Worry: Not on file    Inability: Not on file  . Transportation needs    Medical: Not on file    Non-medical: Not  on file  Tobacco Use  . Smoking status: Never Smoker  . Smokeless tobacco: Never Used  Substance and Sexual Activity  . Alcohol use: Yes    Comment: occ  . Drug use: No  . Sexual activity: Yes    Partners: Male    Birth control/protection: Condom, Pill    Comment: Mirena inserted 01-24-13 removed 2014  Lifestyle  . Physical activity    Days per week: Not on file    Minutes per session: Not on file  . Stress: Not on file  Relationships  . Social Herbalist on phone: Not on file    Gets together: Not on file    Attends religious service: Not on file    Active member of club or organization: Not on file    Attends meetings of clubs  or organizations: Not on file    Relationship status: Not on file  . Intimate partner violence    Fear of current or ex partner: Not on file    Emotionally abused: Not on file    Physically abused: Not on file    Forced sexual activity: Not on file  Other Topics Concern  . Not on file  Social History Narrative  . Not on file    Family History  Problem Relation Age of Onset  . Hypertension Paternal Grandmother   . Sudden death Neg Hx   . Hyperlipidemia Neg Hx   . Heart attack Neg Hx   . Diabetes Neg Hx       There is no immunization history on file for this patient.  Outpatient Encounter Medications as of 10/03/2019  Medication Sig  . [DISCONTINUED] ibuprofen (ADVIL,MOTRIN) 600 MG tablet Take ibuprofen 600 mg orally every 6 hours for 3 days and then every 6 hours as needed for pain  . [DISCONTINUED] meloxicam (MOBIC) 15 MG tablet Take 1 tablet (15 mg total) by mouth daily.  . [DISCONTINUED] metroNIDAZOLE (METROGEL) 0.75 % vaginal gel   . [DISCONTINUED] Multiple Vitamin (MULTIVITAMIN) tablet Take 1 tablet by mouth daily.  . [DISCONTINUED] NUVARING 0.12-0.015 MG/24HR vaginal ring Place 1 Device vaginally every 28 (twenty-eight) days. Insert for 21 days, remove for 7 days, repeat   No facility-administered encounter medications on file as of 10/03/2019.      ROS: Gen: no fever, chills; + fatigue  Skin: no rash, itching ENT: no ear pain, ear drainage, nasal congestion, rhinorrhea, sinus pressure, sore throat Eyes: no blurry vision, double vision Resp: no cough, wheeze,SOB CV: no CP, palpitations, LE edema,  GI: + heartburn, no n/v/d/c, abd pain GU: no dysuria, urgency, frequency, hematuria  MSK: no joint pain, myalgias, back pain Neuro: no dizziness, headache, weakness Psych: no depression, anxiety, insomnia   No Known Allergies  BP 124/80   Pulse 83   Temp 98.4 F (36.9 C) (Tympanic)   Ht 5\' 3"  (1.6 m)   Wt 276 lb (125.2 kg)   LMP 09/17/2019 (Approximate)    SpO2 97%   BMI 48.89 kg/m   Physical Exam  Constitutional: She is oriented to person, place, and time. She appears well-developed and well-nourished. No distress.  Morbidly obese  HENT:  Head: Normocephalic and atraumatic.  Right Ear: Tympanic membrane and ear canal normal.  Left Ear: Tympanic membrane and ear canal normal.  Nose: Nose normal.  Mouth/Throat: Oropharynx is clear and moist and mucous membranes are normal.  Eyes: Pupils are equal, round, and reactive to light. Conjunctivae are normal.  Neck: Neck supple. No  thyromegaly present.  Cardiovascular: Normal rate, regular rhythm, normal heart sounds and intact distal pulses.  No murmur heard. Pulmonary/Chest: Effort normal and breath sounds normal. No respiratory distress. She has no wheezes. She has no rhonchi.  Abdominal: Soft. Bowel sounds are normal. She exhibits no distension and no mass. There is no abdominal tenderness.  Musculoskeletal:        General: No edema.  Lymphadenopathy:    She has no cervical adenopathy.  Neurological: She is alert and oriented to person, place, and time. She exhibits normal muscle tone. Coordination normal.  Skin: Skin is warm and dry.  Psychiatric: She has a normal mood and affect. Her behavior is normal.     A/P:  1. Encounter to establish care with new doctor  2. Annual physical exam - PAP UTD, due for mammo so referral placed - Tdap UTD as per pt, declines flu vaccine - needs vision exam, dental exam UTD - encouraged pt to continue with intermittent fasting to lose weight and to increase physical activity to at least 18min 5 days per week - ALT - AST - Basic metabolic panel - Lipid panel - VITAMIN D 25 Hydroxy (Vit-D Deficiency, Fractures) - TSH - next CPE in 1 year  3. Class 3 severe obesity without serious comorbidity with body mass index (BMI) of 45.0 to 49.9 in adult, unspecified obesity type ([place]) - encouraged pt to continue with intermittent fasting to lose weight and  to increase physical activity to at least 48min 5 days per week - f/u in 3-4 mo or sooner PRN  4. Influenza vaccination declined by patient  5. Encounter for screening mammogram for malignant neoplasm of breast - MM DIGITAL SCREENING BILATERAL; Future

## 2019-10-04 ENCOUNTER — Other Ambulatory Visit: Payer: Self-pay | Admitting: Family Medicine

## 2019-10-04 DIAGNOSIS — E559 Vitamin D deficiency, unspecified: Secondary | ICD-10-CM | POA: Insufficient documentation

## 2019-10-04 MED ORDER — VITAMIN D (ERGOCALCIFEROL) 1.25 MG (50000 UNIT) PO CAPS: 50000.0000 [IU] | ORAL_CAPSULE | ORAL | 2 refills | Status: DC

## 2019-11-06 ENCOUNTER — Telehealth (INDEPENDENT_AMBULATORY_CARE_PROVIDER_SITE_OTHER): Payer: 59 | Admitting: Family Medicine

## 2019-11-06 ENCOUNTER — Encounter: Payer: Self-pay | Admitting: Family Medicine

## 2019-11-06 DIAGNOSIS — H1032 Unspecified acute conjunctivitis, left eye: Secondary | ICD-10-CM

## 2019-11-06 DIAGNOSIS — H109 Unspecified conjunctivitis: Secondary | ICD-10-CM | POA: Insufficient documentation

## 2019-11-06 MED ORDER — POLYMYXIN B-TRIMETHOPRIM 10000-0.1 UNIT/ML-% OP SOLN: 1.0000 [drp] | OPHTHALMIC | 0 refills | Status: DC

## 2019-11-06 NOTE — Progress Notes (Signed)
Kathy Werner - 43 y.o. female MRN [id number]  Date of birth: Jan 11, 1976   This visit type was conducted due to national recommendations for restrictions regarding the COVID-19 Pandemic (e.g. social distancing).  This format is felt to be most appropriate for this patient at this time.  All issues noted in this document were discussed and addressed.  No physical exam was performed (except for noted visual exam findings with Video Visits).  I discussed the limitations of evaluation and management by telemedicine and the availability of in person appointments. The patient expressed understanding and agreed to proceed.  I connected with@ on 11/06/19 at  9:20 AM EST by a video enabled telemedicine application and verified that I am speaking with the correct person using two identifiers.  Present at visit: Kathy Nutting, DO Kathy Werner   Patient Location: Home [address] [place] [id number]   Provider location:   [place]  Chief Complaint  Patient presents with  . Conjunctivitis    pt said her left eye was red and itchy yesterday and this morning it was closed and irritating.    HPI  [name] is a 43 y.o. female who presents via audio/video conferencing for a telehealth visit today.  She has complaint of L eye irritation and redness.  She reports symptoms started last night. Her eye was itchy and irritated feeling.  She woke up this morning and eye was matted shut.  Had some relief with warm compress.  She denies fever, congestion, vision changes, or eye pain.  She does not wear contact lenses.  There are no other ill family members or known sick contacts.    ROS:  A comprehensive ROS was completed and negative except as noted per HPI  Past Medical History:  Diagnosis Date  . Anemia    receiving iron infusion 01/07/2017  . Fibroid   . Menorrhagia 03/2014  . Migraines   . Vaginal Pap smear, abnormal    now nl since L:EEP2002    Past  Surgical History:  Procedure Laterality Date  . DILATATION & CURETTAGE/HYSTEROSCOPY WITH MYOSURE N/A 02/17/2015   Procedure: DILATATION & CURETTAGE/HYSTEROSCOPY WITH MYOSURE;  Surgeon: Kathy Manges, MD;  Location: [facility];  Service: Gynecology;  Laterality: N/A;  . DILATATION & CURETTAGE/HYSTEROSCOPY WITH MYOSURE N/A 01/11/2017   Procedure: DILATATION & CURETTAGE/HYSTEROSCOPY WITH MYOSURE, 2.9 RESECTION OF  FIBROID;  Surgeon: Kathy Manges, MD;  Location: [facility];  Service: Gynecology;  Laterality: N/A;  . DILITATION & CURRETTAGE/HYSTROSCOPY WITH HYDROTHERMAL ABLATION N/A 01/11/2017   Procedure: DILATATION & CURETTAGE/HYSTEROSCOPY WITH HYDROTHERMAL ABLATION;  Surgeon: Kathy Manges, MD;  Location: [facility];  Service: Gynecology;  Laterality: N/A;  Dr. Leo Werner is asking that the HTA be completed before the resection  Kathy Werner will be here   . IR ANGIOGRAM PELVIS SELECTIVE OR SUPRASELECTIVE  01/03/2018  . IR ANGIOGRAM PELVIS SELECTIVE OR SUPRASELECTIVE  01/03/2018  . IR ANGIOGRAM SELECTIVE EACH ADDITIONAL VESSEL  01/03/2018  . IR ANGIOGRAM SELECTIVE EACH ADDITIONAL VESSEL  01/03/2018  . IR EMBO TUMOR ORGAN ISCHEMIA INFARCT INC GUIDE ROADMAPPING  01/03/2018  . IR RADIOLOGIST EVAL & MGMT  12/15/2017  . IR RADIOLOGIST EVAL & MGMT  02/15/2018  . IR RADIOLOGIST EVAL & MGMT  07/19/2018  . IR US GUIDE VASC ACCESS RIGHT  01/03/2018  . IUD REMOVAL  06/2013   due to worsening dysmenorrhea  . MIRENA  Inserted 01-24-13  . WISDOM TOOTH EXTRACTION      Family History  Problem Relation Age of Onset  . Hypertension Paternal Grandmother   . Sudden death Neg Hx   . Hyperlipidemia Neg Hx   . Heart attack Neg Hx   . Diabetes Neg Hx     Social History   Socioeconomic History  . Marital status: Single    Spouse name: Not on file  . Number of children: Not on file  . Years of education: Not on file  . Highest education level: Not on file  Occupational History  . Not  on file  Social Needs  . Financial resource strain: Not on file  . Food insecurity    Worry: Not on file    Inability: Not on file  . Transportation needs    Medical: Not on file    Non-medical: Not on file  Tobacco Use  . Smoking status: Never Smoker  . Smokeless tobacco: Never Used  Substance and Sexual Activity  . Alcohol use: Yes    Comment: occ  . Drug use: No  . Sexual activity: Yes    Partners: Male    Birth control/protection: Condom, Pill    Comment: Mirena inserted 01-24-13 removed 2014  Lifestyle  . Physical activity    Days per week: Not on file    Minutes per session: Not on file  . Stress: Not on file  Relationships  . Social Herbalist on phone: Not on file    Gets together: Not on file    Attends religious service: Not on file    Active member of club or organization: Not on file    Attends meetings of clubs or organizations: Not on file    Relationship status: Not on file  . Intimate partner violence    Fear of current or ex partner: Not on file    Emotionally abused: Not on file    Physically abused: Not on file    Forced sexual activity: Not on file  Other Topics Concern  . Not on file  Social History Narrative  . Not on file     Current Outpatient Medications:  Marland Kitchen  Vitamin D, Ergocalciferol, (DRISDOL) 1.25 MG (50000 UT) CAPS capsule, Take 1 capsule (50,000 Units total) by mouth every 7 (seven) days., Disp: 5 capsule, Rfl: 2 .  trimethoprim-polymyxin b (POLYTRIM) ophthalmic solution, Place 1 drop into the left eye every 4 (four) hours., Disp: 10 mL, Rfl: 0  EXAM:  VITALS per patient if applicable: Ht 5\' 3"  (1.6 m)   BMI 48.89 kg/m   GENERAL: alert, oriented, appears well and in no acute distress  HEENT: atraumatic, L conjunctiva injected and mild puffiness around the L eye, no obvious abnormalities on inspection of external nose and ears  NECK: normal movements of the head and neck  LUNGS: on inspection no signs of respiratory  distress, breathing rate appears normal, no obvious gross SOB, gasping or wheezing  CV: no obvious cyanosis  MS: moves all visible extremities without noticeable abnormality  PSYCH/NEURO: pleasant and cooperative, no obvious depression or anxiety, speech and thought processing grossly intact  ASSESSMENT AND PLAN:  Discussed the following assessment and plan:  Conjunctivitis No red flags in history.  Denies contact lens use.  Start polytrim drops.  Continue warm compress as needed.  Discussed hygiene to prevent spread.  Call with questions or development of new/changing symptoms.      I discussed the assessment and  treatment plan with the patient. The patient was provided an opportunity to ask questions and all were answered. The patient agreed with the plan and demonstrated an understanding of the instructions.   The patient was advised to call back or seek an in-person evaluation if the symptoms worsen or if the condition fails to improve as anticipated.    Kathy Nutting, DO

## 2019-11-06 NOTE — Assessment & Plan Note (Signed)
No red flags in history.  Denies contact lens use.  Start polytrim drops.  Continue warm compress as needed.  Discussed hygiene to prevent spread.  Call with questions or development of new/changing symptoms.

## 2019-11-20 IMAGING — US IR ANGIO/ADD [PERSON_NAME]
1 series · 1 of 1 positions shown · non-contrast
Comparison: none

INDICATION: Symptomatic uterine fibroids. Patient presents today for bilateral
uterine artery embolization.

[Series 1: ir (id) (id)/(id)/(id) · 1 of 1 slices shown]
[im 1/1]
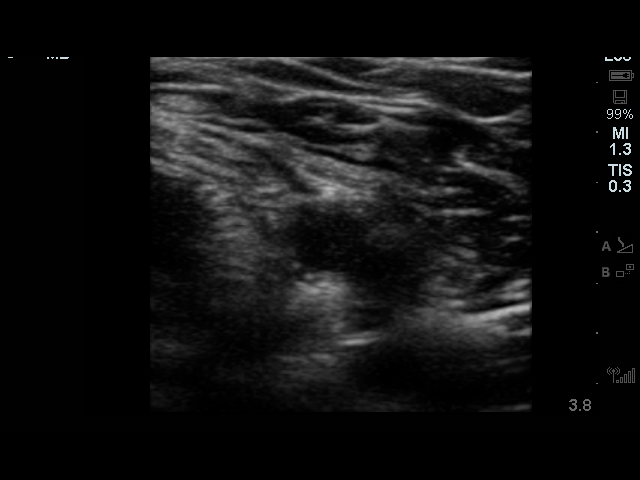

[1 of 1 positions shown; findings below may reference images not displayed]

Please refer to formal consultation in the [REDACTED] EMR dated 12/15/2017
for additional details.

EXAM:
Uterine Fibroid Embolization

MEDICATIONS:
Toradol 30 mg IV; Zofran 4 mg IV; Ancef 2 gm IV. The antibiotic was
administered within one hour of the procedure

ANESTHESIA/SEDATION:
Moderate (conscious) sedation was employed during this procedure. A
total of Versed 2 mg and Fentanyl 200 mcg was administered
intravenously.

Moderate Sedation Time: 78 minutes. The patient's level of
consciousness and vital signs were monitored continuously by
radiology nursing throughout the procedure under my direct
supervision.

CONTRAST:  80 cc Isovue 300

FLUOROSCOPY TIME:  24 minutes 12 seconds (2,694 mGy)

COMPLICATIONS:
None immediate.



The right femoral head was marked fluoroscopically. Under sterile
conditions and local anesthesia, the right common femoral artery
access was performed with a micropuncture needle. Under direct
ultrasound guidance, the right common femoral was accessed with a
micropuncture kit. An ultrasound image was saved for documentation
purposes. This allowed for placement of a 5-French vascular sheath.
A limited arteriogram was performed through the side arm of the
sheath confirming appropriate access within the right common femoral
artery.

The 5-French C2 catheter was utilized to select the contralateral
left internal iliac artery. Selective left internal iliac angiogram
was performed. The tortuous left uterine artery was identified.
Selective catheterization was performed of the left uterine artery
with a microcatheter and micro guide wire. A selective left uterine
angiogram was performed. This demonstrated patency of the left
uterine artery. Mild diffuse hypervascularity of the enlarged
fibroid uterus. Access was adequate for embolization. For
embolization, 3.5 vials of 500 - 700 micron Embospheres were
injected into the left uterine artery. Post embolization angiogram
confirms complete stasis of the left uterine vascular territory.
Microcatheter was removed.

The C2 catheter was retracted and utilized to select the right
internal iliac artery. Selective right internal iliac angiogram was
performed. The patent right uterine artery was identified. For
selective catheterization, the micro catheter and guidewire were
utilized to select the right uterine artery. Selective right uterine
angiogram was performed. This demonstrated patency of the right
uterine artery. Catheter position was safe for embolization.
Embolization was performed to complete stasis with injection of
vials of 500-700 micron Embospheres. Post embolization angiogram
confirms complete stasis of the right uterine vascular territory.

At this point, all wires, catheters and sheaths were removed from
the patient. Hemostasis was achieved at the right groin access site
with manual compression.

The patient tolerated the procedure well without immediate post
procedural complication.
FINDINGS: Bilateral internal iliac arteriograms demonstrates conventional
branching pattern with widely patent origins of the bilateral
uterine arteries.

Sub selective bilateral uterine arteriograms demonstrate co-dominant
supply to a hypertrophied myomatous uterus.

Completion arteriograms following bilateral uterine artery particle
embolization demonstrates a technically excellent result with stasis
of flow within the bilateral uterine vascular territories.
IMPRESSION: Successful bilateral uterine artery embolization ([facility]).

PLAN:
The patient will be seen in follow-up consultation at the
[REDACTED] in approximately 3-4 weeks.

## 2019-11-29 ENCOUNTER — Ambulatory Visit
Admission: RE | Admit: 2019-11-29 | Discharge: 2019-11-29 | Disposition: A | Discharge status: Home / Self Care | Payer: Managed Care, Other (non HMO) | Source: Ambulatory Visit | Attending: Family Medicine | Admitting: Family Medicine

## 2019-11-29 ENCOUNTER — Other Ambulatory Visit: Payer: Self-pay

## 2019-11-29 DIAGNOSIS — Z1231 Encounter for screening mammogram for malignant neoplasm of breast: Secondary | ICD-10-CM

## 2019-12-20 ENCOUNTER — Other Ambulatory Visit: Payer: Self-pay | Admitting: Family Medicine

## 2019-12-20 DIAGNOSIS — E559 Vitamin D deficiency, unspecified: Secondary | ICD-10-CM

## 2020-05-06 ENCOUNTER — Emergency Department (HOSPITAL_COMMUNITY): Payer: 59

## 2020-05-06 ENCOUNTER — Emergency Department (HOSPITAL_COMMUNITY)
Admission: EM | Admit: 2020-05-06 | Discharge: 2020-05-06 | Disposition: A | Discharge status: Home / Self Care | Payer: 59 | Attending: Emergency Medicine | Admitting: Emergency Medicine

## 2020-05-06 ENCOUNTER — Other Ambulatory Visit: Payer: Self-pay

## 2020-05-06 DIAGNOSIS — Z5321 Procedure and treatment not carried out due to patient leaving prior to being seen by health care provider: Secondary | ICD-10-CM | POA: Insufficient documentation

## 2020-05-06 DIAGNOSIS — R0789 Other chest pain: Secondary | ICD-10-CM | POA: Diagnosis not present

## 2020-05-06 DIAGNOSIS — R0602 Shortness of breath: Secondary | ICD-10-CM | POA: Diagnosis not present

## 2020-05-06 DIAGNOSIS — U071 COVID-19: Secondary | ICD-10-CM | POA: Diagnosis not present

## 2020-05-06 LAB — BASIC METABOLIC PANEL
Anion gap: 9 (ref 5–15)
BUN: 11 mg/dL (ref 6–20)
CO2: 26 mmol/L (ref 22–32)
Calcium: 9.2 mg/dL (ref 8.9–10.3)
Chloride: 107 mmol/L (ref 98–111)
Creatinine, Ser: 0.96 mg/dL (ref 0.44–1.00)
GFR calc Af Amer: >60 mL/min (ref >60)
GFR calc non Af Amer: >60 mL/min (ref >60)
Glucose, Bld: 104 mg/dL — ABNORMAL HIGH (ref 70–99)
Potassium: 4.2 mmol/L (ref 3.5–5.1)
Sodium: 142 mmol/L (ref 135–145)

## 2020-05-06 LAB — TROPONIN I (HIGH SENSITIVITY): Troponin I (High Sensitivity): 4 ng/L (ref <18)

## 2020-05-06 LAB — CBC
HCT: 37 % (ref 36.0–46.0)
Hemoglobin: 12.1 g/dL (ref 12.0–15.0)
MCH: 30.8 pg (ref 26.0–34.0)
MCHC: 32.7 g/dL (ref 30.0–36.0)
MCV: 94.1 fL (ref 80.0–100.0)
Platelets: 295 10*3/uL (ref 150–400)
RBC: 3.93 MIL/uL (ref 3.87–5.11)
RDW: 11.7 % (ref 11.5–15.5)
WBC: 7.3 10*3/uL (ref 4.0–10.5)
nRBC: 0 % (ref 0.0–0.2)

## 2020-05-06 LAB — I-STAT BETA HCG BLOOD, ED (MC, WL, AP ONLY): I-stat hCG, quantitative: <5 m[IU]/mL (ref <5)

## 2020-05-06 MED ORDER — ALBUTEROL SULFATE (2.5 MG/3ML) 0.083% IN NEBU: 5.0000 mg | INHALATION_SOLUTION | Freq: Once | RESPIRATORY_TRACT | Status: DC

## 2020-05-06 NOTE — ED Triage Notes (Signed)
Pt presents to ED POV. pt states that she was tested pos for COVID 04/21/2020. Pt c/o beginning to feel SOB today and tightness in chest. Resp e/u.

## 2020-05-06 NOTE — ED Notes (Signed)
encouraged pt to stay pt said that she could not wait any longer

## 2020-05-08 ENCOUNTER — Encounter (HOSPITAL_COMMUNITY): Payer: Self-pay | Admitting: Emergency Medicine

## 2020-05-08 ENCOUNTER — Ambulatory Visit (HOSPITAL_COMMUNITY)
Admission: EM | Admit: 2020-05-08 | Discharge: 2020-05-08 | Disposition: A | Discharge status: Home / Self Care | Payer: 59 | Attending: Family Medicine | Admitting: Family Medicine

## 2020-05-08 ENCOUNTER — Other Ambulatory Visit: Payer: Self-pay

## 2020-05-08 DIAGNOSIS — U071 COVID-19: Secondary | ICD-10-CM | POA: Diagnosis not present

## 2020-05-08 DIAGNOSIS — R0602 Shortness of breath: Secondary | ICD-10-CM

## 2020-05-08 MED ORDER — ALBUTEROL SULFATE HFA 108 (90 BASE) MCG/ACT IN AERS: 1.0000 | INHALATION_SPRAY | Freq: Four times a day (QID) | RESPIRATORY_TRACT | 0 refills | Status: DC | PRN

## 2020-05-08 NOTE — ED Provider Notes (Signed)
[place]    CSN: [id number] Arrival date & time: 05/08/20  [id number]      History   Chief Complaint Chief Complaint  Patient presents with  . Chest Pain    HPI Kathy Werner is a 44 y.o. female.   Patient is a 44 year old female with no significant past medical history.  She presents today with some mild shortness of breath, chest discomfort that started this Monday.  She was diagnosed with COVID-19 on May 10.  The symptoms had mostly resolved and she was feeling better.  She went to the ER on Monday due to concerns and had blood work done, chest x-ray and EKG but did not get results due to leaving because of wait time.  Was concerned due to flag on her MyChart that her EKG was abnormal.  She has been using Mucinex for cough since this started.  Denies any history of asthma.  Reports the cough is mildly dry but has had no fevers, chills since the beginning of her illness.  ROS per HPI      Past Medical History:  Diagnosis Date  . Anemia    receiving iron infusion 01/07/2017  . Fibroid   . Menorrhagia 03/2014  . Migraines   . Vaginal Pap smear, abnormal    now nl since L:[id number]    Patient Active Problem List   Diagnosis Date Noted  . Conjunctivitis 11/06/2019  . Vitamin D deficiency 10/04/2019  . Endometritis 02/08/2019  . Anemia 01/09/2018  . Dysmenorrhea 01/09/2018  . Encounter for education about contraceptive use 02/07/2017  . Hypersensitivity reaction 09/08/2016  . Menorrhagia 02/16/2015  . Iron deficiency anemia 08/23/2014  . Uterine leiomyoma 07/02/2013  . Migraine   . Heel pain 10/03/2011  . OBESITY, NOS 02/09/2007  . TENSION HEADACHE 02/09/2007    Past Surgical History:  Procedure Laterality Date  . DILATATION & CURETTAGE/HYSTEROSCOPY WITH MYOSURE N/A 02/17/2015   Procedure: DILATATION & CURETTAGE/HYSTEROSCOPY WITH MYOSURE;  Surgeon: Eldred Manges, MD;  Location: [facility];  Service: Gynecology;  Laterality: N/A;  . DILATATION &  CURETTAGE/HYSTEROSCOPY WITH MYOSURE N/A 01/11/2017   Procedure: DILATATION & CURETTAGE/HYSTEROSCOPY WITH MYOSURE, 2.9 RESECTION OF  FIBROID;  Surgeon: Eldred Manges, MD;  Location: [facility];  Service: Gynecology;  Laterality: N/A;  . DILITATION & CURRETTAGE/HYSTROSCOPY WITH HYDROTHERMAL ABLATION N/A 01/11/2017   Procedure: DILATATION & CURETTAGE/HYSTEROSCOPY WITH HYDROTHERMAL ABLATION;  Surgeon: Eldred Manges, MD;  Location: [facility];  Service: Gynecology;  Laterality: N/A;  Dr. Leo Grosser is asking that the HTA be completed before the resection  [place] HTA rep will be here   . IR ANGIOGRAM PELVIS SELECTIVE OR SUPRASELECTIVE  01/03/2018  . IR ANGIOGRAM PELVIS SELECTIVE OR SUPRASELECTIVE  01/03/2018  . IR ANGIOGRAM SELECTIVE EACH ADDITIONAL VESSEL  01/03/2018  . IR ANGIOGRAM SELECTIVE EACH ADDITIONAL VESSEL  01/03/2018  . IR EMBO TUMOR ORGAN ISCHEMIA INFARCT INC GUIDE ROADMAPPING  01/03/2018  . IR RADIOLOGIST EVAL & MGMT  12/15/2017  . IR RADIOLOGIST EVAL & MGMT  02/15/2018  . IR RADIOLOGIST EVAL & MGMT  07/19/2018  . IR US GUIDE VASC ACCESS RIGHT  01/03/2018  . IUD REMOVAL  06/2013   due to worsening dysmenorrhea  . MIRENA     Inserted 01-24-13  . WISDOM TOOTH EXTRACTION      OB History    Gravida  3   Para      Term      Preterm  AB  1   Living  2     SAB  1   TAB      Ectopic      Multiple  1   Live Births               Home Medications    Prior to Admission medications   Medication Sig Start Date End Date Taking? Authorizing Provider  Vitamin D, Ergocalciferol, (DRISDOL) 1.25 MG (50000 UT) CAPS capsule Take 1 capsule (50,000 Units total) by mouth every 7 (seven) days. 10/04/19  Yes Cirigliano, Mary K, DO  albuterol (VENTOLIN HFA) 108 (90 Base) MCG/ACT inhaler Inhale 1-2 puffs into the lungs every 6 (six) hours as needed for wheezing or shortness of breath. 05/08/20   Jaysha Lasure A, NP  trimethoprim-polymyxin b (POLYTRIM) ophthalmic  solution Place 1 drop into the left eye every 4 (four) hours. 11/06/19   Luetta Nutting, DO    Family History Family History  Problem Relation Age of Onset  . Hypertension Paternal Grandmother   . Sudden death Neg Hx   . Hyperlipidemia Neg Hx   . Heart attack Neg Hx   . Diabetes Neg Hx     Social History Social History   Tobacco Use  . Smoking status: Never Smoker  . Smokeless tobacco: Never Used  Substance Use Topics  . Alcohol use: Yes    Comment: occ  . Drug use: No     Allergies   Patient has no known allergies.   Review of Systems Review of Systems   Physical Exam Triage Vital Signs ED Triage Vitals  Enc Vitals Group     BP 05/08/20 0944 118/66     Pulse Rate 05/08/20 0942 86     Resp 05/08/20 0942 16     Temp 05/08/20 0942 99.4 F (37.4 C)     Temp Source 05/08/20 0942 Oral     SpO2 05/08/20 0942 98 %     Weight --      Height --      Head Circumference --      Peak Flow --      Pain Score 05/08/20 0940 3     Pain Loc --      Pain Edu? --      Excl. in [place]? --    No data found.  Updated Vital Signs BP 118/66   Pulse 86   Temp 99.4 F (37.4 C) (Oral)   Resp 16   LMP 04/15/2020   SpO2 98%   Visual Acuity Right Eye Distance:   Left Eye Distance:   Bilateral Distance:    Right Eye Near:   Left Eye Near:    Bilateral Near:     Physical Exam Vitals and nursing note reviewed.  Constitutional:      General: She is not in acute distress.    Appearance: Normal appearance. She is not ill-appearing, toxic-appearing or diaphoretic.  HENT:     Head: Normocephalic.     Nose: Nose normal.     Mouth/Throat:     Pharynx: Oropharynx is clear.  Eyes:     Conjunctiva/sclera: Conjunctivae normal.  Cardiovascular:     Heart sounds: Normal heart sounds.  Pulmonary:     Effort: Pulmonary effort is normal. No respiratory distress.     Breath sounds: Normal breath sounds. No stridor. No wheezing, rhonchi or rales.  Chest:     Chest wall: No  tenderness.  Abdominal:     Palpations: Abdomen is  soft.     Tenderness: There is no abdominal tenderness.  Musculoskeletal:        General: Normal range of motion.     Cervical back: Normal range of motion.  Skin:    General: Skin is warm and dry.     Findings: No rash.  Neurological:     Mental Status: She is alert.  Psychiatric:        Mood and Affect: Mood normal.      UC Treatments / Results  Labs (all labs ordered are listed, but only abnormal results are displayed) Labs Reviewed - No data to display  EKG   Radiology No results found.  Procedures Procedures (including critical care time)  Medications Ordered in UC Medications - No data to display  Initial Impression / Assessment and Plan / UC Course  I have reviewed the triage vital signs and the nursing notes.  Pertinent labs & imaging results that were available during my care of the patient were reviewed by me and considered in my medical decision making (see chart for details).     COVID-19 with shortness of breath. Nothing concerning on exam.  Patient speaking in full sentences and not any respiratory distress.  Sats are 98% Reviewing lab work and imaging from the ER x-ray revealed possible bilateral atelectasis versus Covid pneumonia.  Most likely Covid pneumonia based on previous COVID-19 diagnosis. No concern for hypoxia today. Lungs clear on exam Patient otherwise young and healthy do not feel she will have any complications from this.  Her EKG was with normal sinus rhythm and normal rate.  No concerns for ACS. Blood work normal All of these results discussed with patient and reassure her that there was nothing concerning and she likely do fine if she does have COVID-19 pneumonia.  She can keep doing the over-the-counter cough medication as needed and I am giving her an albuterol inhaler to use for acute cough, wheezing or shortness of breath. Recommended possibly doing some allergy pills  also. Patient understanding and agrees to entire plan and thankful for being reassured. She has had a lot of anxiety due to this. Final Clinical Impressions(s) / UC Diagnoses   Final diagnoses:  COVID-19  SOB (shortness of breath)     Discharge Instructions     Albuterol inhaler as needed to He can keep taking the over-the-counter cough medicine.  Recommend also trying some over-the-counter allergy medicine to include cetirizine No concerns on your EKG X-ray showed possibility is some Covid pneumonia which can be a complication from Covid.  Otherwise your oxygen is normal and your other vital signs are normal.  Believe you will do fine with this. Advance activity as tolerated and if your symptoms worsen please go to the ER.   ED Prescriptions    Medication Sig Dispense Auth. Provider   albuterol (VENTOLIN HFA) 108 (90 Base) MCG/ACT inhaler Inhale 1-2 puffs into the lungs every 6 (six) hours as needed for wheezing or shortness of breath. 18 g Loura Halt A, NP     PDMP not reviewed this encounter.   Loura Halt A, NP 05/08/20 1052

## 2020-05-08 NOTE — Discharge Instructions (Addendum)
Albuterol inhaler as needed to He can keep taking the over-the-counter cough medicine.  Recommend also trying some over-the-counter allergy medicine to include cetirizine No concerns on your EKG X-ray showed possibility is some Covid pneumonia which can be a complication from Covid.  Otherwise your oxygen is normal and your other vital signs are normal.  Believe you will do fine with this. Advance activity as tolerated and if your symptoms worsen please go to the ER.

## 2020-05-08 NOTE — ED Triage Notes (Signed)
Positive for COVID 5/10. She went to ED for chest pain/ shortness of breath at 2am Monday. She had bloodwork and EKG in triage and there was an 11 hour wait. Her EKG was available on mychart this morning and it said "abnormal" so patient came in to review results from ER.

## 2020-07-24 ENCOUNTER — Encounter: Payer: Self-pay | Admitting: Family

## 2020-07-24 ENCOUNTER — Telehealth (INDEPENDENT_AMBULATORY_CARE_PROVIDER_SITE_OTHER): Payer: 59 | Admitting: Family

## 2020-07-24 VITALS — Temp 97.0°F | Ht 63.0 in | Wt 262.0 lb

## 2020-07-24 DIAGNOSIS — J019 Acute sinusitis, unspecified: Secondary | ICD-10-CM

## 2020-07-24 DIAGNOSIS — B9789 Other viral agents as the cause of diseases classified elsewhere: Secondary | ICD-10-CM

## 2020-07-24 MED ORDER — PREDNISONE 10 MG (21) PO TBPK: ORAL_TABLET | ORAL | 0 refills | Status: DC

## 2020-07-24 NOTE — Progress Notes (Signed)
   Virtual Visit via Video   I connected with patient on 07/24/20 at  2:20 PM EDT by a video enabled telemedicine application and verified that I am speaking with the correct person using two identifiers.  Location patient: Home Location provider: [facility], Office Persons participating in the virtual visit: Patient, Provider, CMA  I discussed the limitations of evaluation and management by telemedicine and the availability of in person appointments. The patient expressed understanding and agreed to proceed.  Subjective:   HPI:   44 year old female is in today with c/o sinus pressure, pain in her teeth, and nasal congestion x 2 days. She has been using steam to help her symptoms. No fever or chills  ROS:   See pertinent positives and negatives per HPI.  Patient Active Problem List   Diagnosis Date Noted  . Conjunctivitis 11/06/2019  . Vitamin D deficiency 10/04/2019  . Endometritis 02/08/2019  . Anemia 01/09/2018  . Dysmenorrhea 01/09/2018  . Encounter for education about contraceptive use 02/07/2017  . Hypersensitivity reaction 09/08/2016  . Menorrhagia 02/16/2015  . Iron deficiency anemia 08/23/2014  . Uterine leiomyoma 07/02/2013  . Migraine   . Heel pain 10/03/2011  . OBESITY, NOS 02/09/2007  . TENSION HEADACHE 02/09/2007    Social History   Tobacco Use  . Smoking status: Never Smoker  . Smokeless tobacco: Never Used  Substance Use Topics  . Alcohol use: Yes    Comment: occ    Current Outpatient Medications:  .  albuterol (VENTOLIN HFA) 108 (90 Base) MCG/ACT inhaler, Inhale 1-2 puffs into the lungs every 6 (six) hours as needed for wheezing or shortness of breath., Disp: 18 g, Rfl: 0 .  trimethoprim-polymyxin b (POLYTRIM) ophthalmic solution, Place 1 drop into the left eye every 4 (four) hours., Disp: 10 mL, Rfl: 0 .  Vitamin D, Ergocalciferol, (DRISDOL) 1.25 MG (50000 UT) CAPS capsule, Take 1 capsule (50,000 Units total) by mouth every 7 (seven)  days., Disp: 5 capsule, Rfl: 2 .  predniSONE (STERAPRED UNI-PAK 21 TAB) 10 MG (21) TBPK tablet, As directed, Disp: 21 tablet, Rfl: 0  No Known Allergies  Objective:   Temp (!) 97 F (36.1 C) (Other (Comment))   Ht 5\' 3"  (1.6 m)   Wt 262 lb (118.8 kg)   LMP 07/08/2020   BMI 46.41 kg/m   Patient is well-developed, well-nourished in no acute distress.  Resting comfortably at home.  Head is normocephalic, atraumatic.  No labored breathing.  Speech is clear and coherent with logical content.  Patient is alert and oriented at baseline.    Assessment and Plan:    Kathy Werner was seen today for sinus problem and nasal congestion.  Diagnoses and all orders for this visit:  Acute viral sinusitis  Other orders -     predniSONE (STERAPRED UNI-PAK 21 TAB) 10 MG (21) TBPK tablet; As directed     Kathy Werner, [place] 07/24/2020

## 2020-08-06 ENCOUNTER — Telehealth: Payer: Self-pay | Admitting: Family Medicine

## 2020-08-06 NOTE — Telephone Encounter (Signed)
Patient called to schedule appt stating she had blood in her stool. Patient was transferred to triage nurse and urged to follow her instructions.

## 2020-08-07 ENCOUNTER — Other Ambulatory Visit: Payer: Self-pay

## 2020-08-07 ENCOUNTER — Ambulatory Visit: Payer: 59 | Admitting: Family Medicine

## 2020-08-07 NOTE — Telephone Encounter (Signed)
Dr. Dierdre Highman for this pt. She does have an appointment with you tomorrow 07/29/2020.

## 2020-08-08 ENCOUNTER — Encounter: Payer: Self-pay | Admitting: Family Medicine

## 2020-08-08 ENCOUNTER — Ambulatory Visit (INDEPENDENT_AMBULATORY_CARE_PROVIDER_SITE_OTHER): Payer: 59 | Admitting: Family Medicine

## 2020-08-08 VITALS — BP 124/80 | HR 94 | Temp 97.8°F | Ht 63.0 in | Wt 266.4 lb

## 2020-08-08 DIAGNOSIS — K921 Melena: Secondary | ICD-10-CM

## 2020-08-08 DIAGNOSIS — F419 Anxiety disorder, unspecified: Secondary | ICD-10-CM | POA: Diagnosis not present
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
## 2020-08-08 NOTE — Patient Instructions (Signed)
Melatonin 3mg  30-60 min prior to bed  For nausea/upset stomach: Ginger Peppermint B6

## 2021-02-27 LAB — HM PAP SMEAR

## 2021-04-13 ENCOUNTER — Encounter: Payer: Self-pay | Admitting: Physician Assistant

## 2021-04-13 ENCOUNTER — Telehealth (INDEPENDENT_AMBULATORY_CARE_PROVIDER_SITE_OTHER): Payer: 59 | Admitting: Physician Assistant

## 2021-04-13 VITALS — Ht 63.0 in | Wt 261.0 lb

## 2021-04-13 DIAGNOSIS — R0981 Nasal congestion: Secondary | ICD-10-CM

## 2021-04-13 MED ORDER — AMOXICILLIN-POT CLAVULANATE 875-125 MG PO TABS: 1.0000 | ORAL_TABLET | Freq: Two times a day (BID) | ORAL | 0 refills | Status: DC

## 2021-04-13 NOTE — Progress Notes (Signed)
Virtual Visit via Video   I connected with Kathy Werner on 04/13/21 at 11:30 AM EDT by a video enabled telemedicine application and verified that I am speaking with the correct person using two identifiers. Location patient: Home Location provider: [facility], Office Persons participating in the virtual visit: Kathy Werner, [place] PA-C, Anselmo Pickler, LPN   I discussed the limitations of evaluation and management by telemedicine and the availability of in person appointments. The patient expressed understanding and agreed to proceed.  I acted as a Education administrator for [employer], PA-C [employer], LPN   Subjective:   HPI:   Sinus problem Pt c/o sinus pressure, nasal congestion and facial pain, started on Friday.  Denies fever, chills, diarrhea, n/v, abdominal pain. Hx sinus infections consistently with current symptoms. Gets these around this time of year when she is around a lot of pollen. Tried Mucinex sinus max and Sudafed. Sudafed helped significantly but only temporarily. Denies sick contacts or concerns for COVID.  Patient's last menstrual period was 03/25/2021.   ROS: See pertinent positives and negatives per HPI.  Patient Active Problem List   Diagnosis Date Noted  . Conjunctivitis 11/06/2019  . Vitamin D deficiency 10/04/2019  . Endometritis 02/08/2019  . Anemia 01/09/2018  . Dysmenorrhea 01/09/2018  . Encounter for education about contraceptive use 02/07/2017  . Hypersensitivity reaction 09/08/2016  . Menorrhagia 02/16/2015  . Iron deficiency anemia 08/23/2014  . Uterine leiomyoma 07/02/2013  . Migraine   . Heel pain 10/03/2011  . OBESITY, NOS 02/09/2007  . TENSION HEADACHE 02/09/2007    Social History   Tobacco Use  . Smoking status: Never Smoker  . Smokeless tobacco: Never Used  Substance Use Topics  . Alcohol use: Yes    Comment: occ    Current Outpatient Medications:  .  albuterol (VENTOLIN HFA) 108 (90 Base) MCG/ACT inhaler, Inhale  1-2 puffs into the lungs every 6 (six) hours as needed for wheezing or shortness of breath., Disp: 18 g, Rfl: 0 .  amoxicillin-clavulanate (AUGMENTIN) 875-125 MG tablet, Take 1 tablet by mouth 2 (two) times daily., Disp: 20 tablet, Rfl: 0 .  Vitamin D, Cholecalciferol, 25 MCG (1000 UT) CAPS, Take 1 capsule by mouth daily in the afternoon., Disp: , Rfl:   No Known Allergies  Objective:   VITALS: Per patient if applicable, see vitals. GENERAL: Alert, appears well and in no acute distress. HEENT: Atraumatic, conjunctiva clear, no obvious abnormalities on inspection of external nose and ears. NECK: Normal movements of the head and neck. CARDIOPULMONARY: No increased WOB. Speaking in clear sentences. I:E ratio WNL.  MS: Moves all visible extremities without noticeable abnormality. PSYCH: Pleasant and cooperative, well-groomed. Speech normal rate and rhythm. Affect is appropriate. Insight and judgement are appropriate. Attention is focused, linear, and appropriate.  NEURO: CN grossly intact. Oriented as arrived to appointment on time with no prompting. Moves both UE equally.  SKIN: No obvious lesions, wounds, erythema, or cyanosis noted on face or hands.  Assessment and Plan:   Kathy Werner was seen today for sinus problem.  Diagnoses and all orders for this visit:  Sinus congestion  Other orders -     amoxicillin-clavulanate (AUGMENTIN) 875-125 MG tablet; Take 1 tablet by mouth 2 (two) times daily.   No red flags on discussion, patient is not in any obvious distress during our visit. Discussed progression of most viral illnesses and allergic rhinitis, and recommended supportive care at this point in time. I recommend starting daily OTC antihistamine of  choice -- allegra, zyrtec or claritin (generic is fine.) Continue sudafed for 2 more days.  I did however provide pocket rx for oral augmentin should symptoms not improve as anticipated.  Discussed over the counter supportive care options, with  recommendations to push fluids and rest.   Reviewed return precautions including new/worsening fever, SOB, new/worsening cough or other concerns.  Recommended need to self-quarantine and practice social distancing until symptoms resolve. Discussed current recommendations for COVID testing -- she does not feel llike she needs testing at this time. I recommend that patient follow-up if symptoms worsen or persist despite treatment x 7-10 days, sooner if needed.  I discussed the assessment and treatment plan with the patient. The patient was provided an opportunity to ask questions and all were answered. The patient agreed with the plan and demonstrated an understanding of the instructions.   The patient was advised to call back or seek an in-person evaluation if the symptoms worsen or if the condition fails to improve as anticipated.   CMA or LPN served as scribe during this visit. History, Physical, and Plan performed by medical provider. The above documentation has been reviewed and is accurate and complete.   [place], [place] 04/13/2021

## 2021-05-07 ENCOUNTER — Encounter (HOSPITAL_COMMUNITY): Payer: Self-pay | Admitting: Emergency Medicine

## 2021-05-07 ENCOUNTER — Other Ambulatory Visit: Payer: Self-pay

## 2021-05-07 ENCOUNTER — Emergency Department (HOSPITAL_COMMUNITY)
Admission: EM | Admit: 2021-05-07 | Discharge: 2021-05-08 | Disposition: A | Discharge status: Home / Self Care | Payer: 59 | Attending: Emergency Medicine | Admitting: Emergency Medicine

## 2021-05-07 DIAGNOSIS — F418 Other specified anxiety disorders: Secondary | ICD-10-CM

## 2021-05-07 DIAGNOSIS — R519 Headache, unspecified: Secondary | ICD-10-CM | POA: Diagnosis present

## 2021-05-07 DIAGNOSIS — R202 Paresthesia of skin: Secondary | ICD-10-CM | POA: Diagnosis not present

## 2021-05-07 DIAGNOSIS — H6983 Other specified disorders of Eustachian tube, bilateral: Secondary | ICD-10-CM

## 2021-05-07 LAB — COMPREHENSIVE METABOLIC PANEL
ALT: 24 U/L (ref 0–44)
AST: 23 U/L (ref 15–41)
Albumin: 3.8 g/dL (ref 3.5–5.0)
Alkaline Phosphatase: 84 U/L (ref 38–126)
Anion gap: 7 (ref 5–15)
BUN: 13 mg/dL (ref 6–20)
CO2: 27 mmol/L (ref 22–32)
Calcium: 9.6 mg/dL (ref 8.9–10.3)
Chloride: 106 mmol/L (ref 98–111)
Creatinine, Ser: 0.91 mg/dL (ref 0.44–1.00)
GFR, Estimated: >60 mL/min (ref >60)
Glucose, Bld: 99 mg/dL (ref 70–99)
Potassium: 3.8 mmol/L (ref 3.5–5.1)
Sodium: 140 mmol/L (ref 135–145)
Total Bilirubin: 0.9 mg/dL (ref 0.3–1.2)
Total Protein: 6.9 g/dL (ref 6.5–8.1)

## 2021-05-07 LAB — CBC WITH DIFFERENTIAL/PLATELET
Abs Immature Granulocytes: 0.01 10*3/uL (ref 0.00–0.07)
Basophils Absolute: 0.1 10*3/uL (ref 0.0–0.1)
Basophils Relative: 1 %
Eosinophils Absolute: 0.1 10*3/uL (ref 0.0–0.5)
Eosinophils Relative: 1 %
HCT: 38.5 % (ref 36.0–46.0)
Hemoglobin: 12.8 g/dL (ref 12.0–15.0)
Immature Granulocytes: 0 %
Lymphocytes Relative: 35 %
Lymphs Abs: 2.9 10*3/uL (ref 0.7–4.0)
MCH: 31.2 pg (ref 26.0–34.0)
MCHC: 33.2 g/dL (ref 30.0–36.0)
MCV: 93.9 fL (ref 80.0–100.0)
Monocytes Absolute: 0.7 10*3/uL (ref 0.1–1.0)
Monocytes Relative: 8 %
Neutro Abs: 4.6 10*3/uL (ref 1.7–7.7)
Neutrophils Relative %: 55 %
Platelets: 215 10*3/uL (ref 150–400)
RBC: 4.1 MIL/uL (ref 3.87–5.11)
RDW: 12.1 % (ref 11.5–15.5)
WBC: 8.3 10*3/uL (ref 4.0–10.5)
nRBC: 0 % (ref 0.0–0.2)

## 2021-05-07 NOTE — ED Triage Notes (Signed)
Pt has been having constant headaches for the past couple of days.  She reports that yesterday her right arm started to have pain and numbness.  Some vision changes however saw eye dr for that.  No neuro symptoms noted at this time.

## 2021-05-08 MED ORDER — DIPHENHYDRAMINE HCL 50 MG/ML IJ SOLN
25.0000 mg | Freq: Once | INTRAMUSCULAR | Status: AC
  Administered 2021-05-08: 25 mg via INTRAVENOUS
  Filled 2021-05-08: qty 1

## 2021-05-08 MED ORDER — PROCHLORPERAZINE EDISYLATE 10 MG/2ML IJ SOLN
10.0000 mg | Freq: Once | INTRAMUSCULAR | Status: AC
  Administered 2021-05-08: 10 mg via INTRAVENOUS
  Filled 2021-05-08: qty 2

## 2021-05-08 MED ORDER — KETOROLAC TROMETHAMINE 30 MG/ML IJ SOLN
30.0000 mg | Freq: Once | INTRAMUSCULAR | Status: AC
  Administered 2021-05-08: 30 mg via INTRAVENOUS
  Filled 2021-05-08: qty 1

## 2021-05-08 MED ORDER — SODIUM CHLORIDE 0.9 % IV BOLUS
500.0000 mL | Freq: Once | INTRAVENOUS | Status: AC
  Administered 2021-05-08: 500 mL via INTRAVENOUS

## 2021-05-08 NOTE — ED Provider Notes (Signed)
[facility] EMERGENCY DEPARTMENT Provider Note   CSN: [id number] Arrival date & time: 05/07/21  2104     History Chief Complaint  Patient presents with  . Headache    Kathy Werner is a 45 y.o. female with a history of migraines, tension headaches, anxiety, and anemia who presents the emergency department with a chief complaint of headache.  The patient reports that she developed a bilateral frontal headache that began while she was driving to work today.  The headache began gradually and did not increase in intensity for several hours.  She reports that after going to work that she noted that the headache continued over her bilateral forehead and was now also present near the back of her head and felt like tension.  She did take 2 tablets of Tylenol with some improvement in her headache, but her headache did not resolve.  She was prompted to come to the emergency department after googling her symptoms and was concerned that she had a ruptured aneurysm.  She does have a history of migraines, but those are typically associated with photophobia and phonophobia, which she has not had today.  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  She was seen by her eye doctor 2 months ago who stated that she may seen have to have a small prescription.  She does also report that she does spend most of her days typing and has had some intermittent numbness and tingling in her right forearm that is worse throughout the day when she was at work and will intermittently wake her up at night and is improved with shaking or moving her arm.  She did not note any new paresthesias, numbness, tingling, or visual changes accompanying her headache today.  No neck pain, fever, chills, rhinorrhea, nasal congestion, chest pain, shortness of breath, diaphoresis, slurred speech,  dizziness, lightheadedness, or facial droop.  The patient was treated for a sinus infection a few months ago, but the symptoms improved.  However, she still has some of her antibiotic left at home.  The patient reports that she is also been feeling very anxious about her health more recently.  No SI, HI, or auditory visual hallucinations.  The history is provided by the patient and medical records. No language interpreter was used.       Past Medical History:  Diagnosis Date  . Anemia    receiving iron infusion 01/07/2017  . Fibroid   . Menorrhagia 03/2014  . Migraines   . Vaginal Pap smear, abnormal    now nl since [id number]    Patient Active Problem List   Diagnosis Date Noted  . Conjunctivitis 11/06/2019  . Vitamin D deficiency 10/04/2019  . Endometritis 02/08/2019  . Anemia 01/09/2018  . Dysmenorrhea 01/09/2018  . Encounter for education about contraceptive use 02/07/2017  . Hypersensitivity reaction 09/08/2016  . Menorrhagia 02/16/2015  . Iron deficiency anemia 08/23/2014  . Uterine leiomyoma 07/02/2013  . Migraine   . Heel pain 10/03/2011  . OBESITY, NOS 02/09/2007  . TENSION HEADACHE 02/09/2007    Past Surgical History:  Procedure Laterality Date  . DILATATION & CURETTAGE/HYSTEROSCOPY WITH MYOSURE N/A 02/17/2015   Procedure: DILATATION & CURETTAGE/HYSTEROSCOPY WITH MYOSURE;  Surgeon: Eldred Manges, MD;  Location: [facility];  Service: Gynecology;  Laterality: N/A;  . DILATATION & CURETTAGE/HYSTEROSCOPY WITH  MYOSURE N/A 01/11/2017   Procedure: DILATATION & CURETTAGE/HYSTEROSCOPY WITH MYOSURE, 2.9 RESECTION OF  FIBROID;  Surgeon: Eldred Manges, MD;  Location: [facility];  Service: Gynecology;  Laterality: N/A;  . DILITATION & CURRETTAGE/HYSTROSCOPY WITH HYDROTHERMAL ABLATION N/A 01/11/2017   Procedure: DILATATION & CURETTAGE/HYSTEROSCOPY WITH HYDROTHERMAL ABLATION;  Surgeon: Eldred Manges, MD;  Location: [facility];  Service: Gynecology;  Laterality: N/A;  Dr. Leo Grosser is  asking that the HTA be completed before the resection  [facility] HTA rep will be here   . IR ANGIOGRAM PELVIS SELECTIVE OR SUPRASELECTIVE  01/03/2018  . IR ANGIOGRAM PELVIS SELECTIVE OR SUPRASELECTIVE  01/03/2018  . IR ANGIOGRAM SELECTIVE EACH ADDITIONAL VESSEL  01/03/2018  . IR ANGIOGRAM SELECTIVE EACH ADDITIONAL VESSEL  01/03/2018  . IR EMBO TUMOR ORGAN ISCHEMIA INFARCT INC GUIDE ROADMAPPING  01/03/2018  . IR RADIOLOGIST EVAL & MGMT  12/15/2017  . IR RADIOLOGIST EVAL & MGMT  02/15/2018  . IR RADIOLOGIST EVAL & MGMT  07/19/2018  . IR US GUIDE VASC ACCESS RIGHT  01/03/2018  . IUD REMOVAL  06/2013   due to worsening dysmenorrhea  . MIRENA     Inserted 01-24-13  . WISDOM TOOTH EXTRACTION       OB History    Gravida  3   Para      Term      Preterm      AB  1   Living  2     SAB  1   IAB      Ectopic      Multiple  1   Live Births              Family History  Problem Relation Age of Onset  . Hypertension Paternal Grandmother   . Sudden death Neg Hx   . Hyperlipidemia Neg Hx   . Heart attack Neg Hx   . Diabetes Neg Hx     Social History   Tobacco Use  . Smoking status: Never Smoker  . Smokeless tobacco: Never Used  Vaping Use  . Vaping Use: Never used  Substance Use Topics  . Alcohol use: Yes    Comment: occ  . Drug use: No    Home Medications Prior to Admission medications   Medication Sig Start Date End Date Taking? Authorizing Provider  albuterol (VENTOLIN HFA) 108 (90 Base) MCG/ACT inhaler Inhale 1-2 puffs into the lungs every 6 (six) hours as needed for wheezing or shortness of breath. 05/08/20   Loura Halt A, NP  amoxicillin-clavulanate (AUGMENTIN) 875-125 MG tablet Take 1 tablet by mouth 2 (two) times daily. 04/13/21   Inda Coke, PA  Vitamin D, Cholecalciferol, 25 MCG (1000 UT) CAPS Take 1 capsule by mouth daily in the afternoon.    [provider]    Allergies    Patient has no known allergies.  Review of  Systems   Review of Systems  Constitutional: Negative for activity change, chills, diaphoresis and fever.  Respiratory: Negative for shortness of breath.   Cardiovascular: Negative for chest pain.  Gastrointestinal: Negative for abdominal pain, constipation, diarrhea, nausea and vomiting.  Genitourinary: Negative for dysuria.  Musculoskeletal: Negative for back pain and neck pain.  Skin: Negative for color change, rash and wound.  Allergic/Immunologic: Negative for immunocompromised state.  Neurological: Positive for headaches. Negative for dizziness, seizures, syncope, weakness and numbness.  Psychiatric/Behavioral: Negative for confusion.    Physical Exam Updated Vital Signs BP 125/72 (BP Location: Right  Arm)   Pulse 79   Temp 98.3 F (36.8 C) (Oral)   Resp 15   Ht 5\' 3"  (1.6 m)   Wt 113.4 kg   SpO2 98%   BMI 44.29 kg/m   Physical Exam Vitals and nursing note reviewed.  Constitutional:      General: She is not in acute distress.    Appearance: She is obese. She is not ill-appearing or toxic-appearing.  HENT:     Head: Normocephalic.     Right Ear: A middle ear effusion is present. No mastoid tenderness. Tympanic membrane is not injected, perforated or erythematous.     Left Ear: A middle ear effusion is present. No mastoid tenderness. Tympanic membrane is not injected, perforated or erythematous.     Nose: Nose normal. No congestion or rhinorrhea.     Comments: No tenderness palpation over the bilateral frontal or maxillary sinuses. Eyes:     Conjunctiva/sclera: Conjunctivae normal.  Neck:     Comments: No meningismus. Cardiovascular:     Rate and Rhythm: Normal rate and regular rhythm.     Heart sounds: No murmur heard. No friction rub. No gallop.   Pulmonary:     Effort: Pulmonary effort is normal. No respiratory distress.  Abdominal:     General: There is no distension.     Palpations: Abdomen is soft.  Musculoskeletal:     Cervical back: Neck supple.   Skin:    General: Skin is warm.     Findings: No rash.  Neurological:     Mental Status: She is alert.     Comments: GCS 15.  Cranial nerves II through XII are grossly intact.  Alert and oriented x3.  Sensation is intact and equal throughout the bilateral upper and lower extremities.  5 of 5 strength against resistance to the bilateral upper and lower extremities.  Negative Romberg.  No pronator drift.  Gait is not ataxic.  Finger-nose is intact bilaterally.  Normal rapid alternating movements.  Psychiatric:        Mood and Affect: Mood is anxious.        Behavior: Behavior normal.     ED Results / Procedures / Treatments   Labs (all labs ordered are listed, but only abnormal results are displayed) Labs Reviewed  COMPREHENSIVE METABOLIC PANEL  CBC WITH DIFFERENTIAL/PLATELET    EKG None  Radiology No results found.  Procedures Procedures   Medications Ordered in ED Medications  sodium chloride 0.9 % bolus 500 mL (has no administration in time range)  ketorolac (TORADOL) 30 MG/ML injection 30 mg (has no administration in time range)  diphenhydrAMINE (BENADRYL) injection 25 mg (has no administration in time range)  prochlorperazine (COMPAZINE) injection 10 mg (has no administration in time range)    ED Course  I have reviewed the triage vital signs and the nursing notes.  Pertinent labs & imaging results that were available during my care of the patient were reviewed by me and considered in my medical decision making (see chart for details).    MDM Rules/Calculators/A&P                          45 year old female with a history of migraines, tension headaches, anxiety, and anemia who presents with a 1 day history of headache that began gradually when she was driving to work and did not rate maximal intensity for several hours.  Headache was initially improved by taking Tylenol this afternoon, but did not  resolve.  She has no other associated symptoms.  She does have some  paresthesias in her right arm that are worse after she leaves her job where she is typing and looking at a computer screen for most of the day.  This seems consistent with carpal tunnel syndrome.  She has been having some strained vision, but has been seen by ophthalmology and exam was overall reassuring.  Vital signs are stable.  Patient's neurologic exam is unremarkable.  Labs were obtained by triage and are reassuring.  No concern for infection or metabolic derangements.  Symptoms sound most consistent with a tension headache, but the patient does appear to have some eustachian tube dysfunction as she has bilateral middle ear effusion on exam.  She has no red flags for her headache.  I have a low suspicion for meningitis, CVA, intracranial hemorrhage, neoplasm, idiopathic intracranial hypertension.  We will give headache cocktail and reassess.  If headache is improved, she can likely be discharged home with supportive care for eustachian tube dysfunction.  She can also follow-up with her PCP or [facility] for anxiety.  On reevaluation, headache has resolved.  She is feeling much improved.  Will discharge home with supportive care.  She can follow-up with primary care if headaches recur as there is concern for tension headaches.  Discussed supportive care for eustachian tube dysfunction.  Referral to monitor given for anxiety.  ER return precautions given.  She is hemodynamically stable no acute distress.  Safe for discharge home with outpatient follow-up as discussed.  Final Clinical Impression(s) / ED Diagnoses Final diagnoses:  None    Rx / DC Orders ED Discharge Orders    None       Joanne Gavel, PA-C [id number]    Delora Fuel, MD 10/28[birth year]51 [phone]

## 2021-05-08 NOTE — Discharge Instructions (Addendum)
Thank you for allowing me to care for you today in the Emergency Department.   You are seen today for headache.  Your exam was reassuring and her headache resolved while you are in the emergency department.  Take 650 mg of Tylenol or 600 mg of ibuprofen with food every 6 hours for pain.  You can alternate between these 2 medications every 3 hours if your pain returns.  For instance, you can take Tylenol at noon, followed by a dose of ibuprofen at 3, followed by second dose of Tylenol and 6.  For pressure in your face associated with eustachian tube dysfunction, you can try using a nasal spray, such as Flonase or Ocean nasal spray.  This is available over-the-counter and can be used as directed.  You can also try taking a daily allergy medication such as Claritin.  Follow-up with primary care if you continue to have intermittent headaches.  You can follow-up with [facility] if you have anxiety that makes it difficult to function in your everyday life.  Return to the emergency department if you develop a sudden onset, severe headache, new numbness or weakness, loss of vision in one or both eyes, slurred speech, facial droop, confusion, or other new, concerning symptoms.

## 2021-10-15 IMAGING — MG DIGITAL SCREENING BILAT W/ CAD
6 series · 6 of 6 positions shown · non-contrast
Comparison: None.

CLINICAL DATA: Screening. This is the patient's initial baseline
mammogram.

EXAM:
DIGITAL SCREENING BILATERAL MAMMOGRAM WITH CAD

[L CC (1 of 2)]
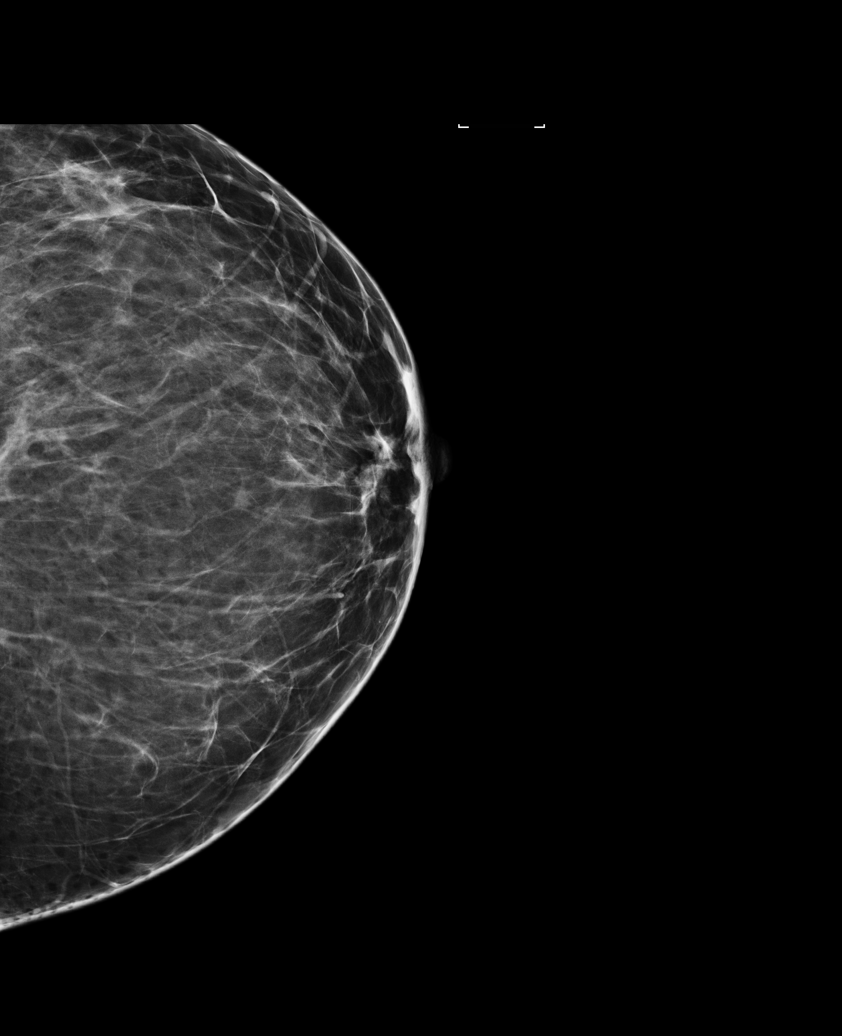

[R CC (1 of 2)]
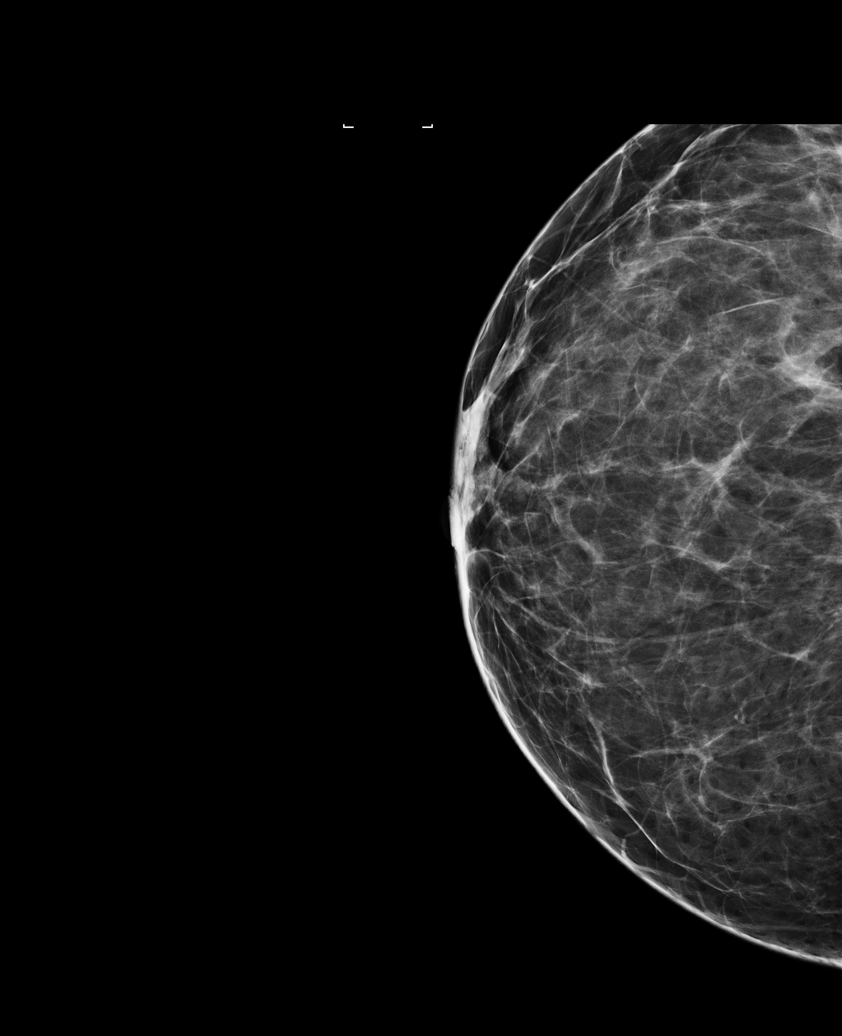

[R CC (2 of 2)]
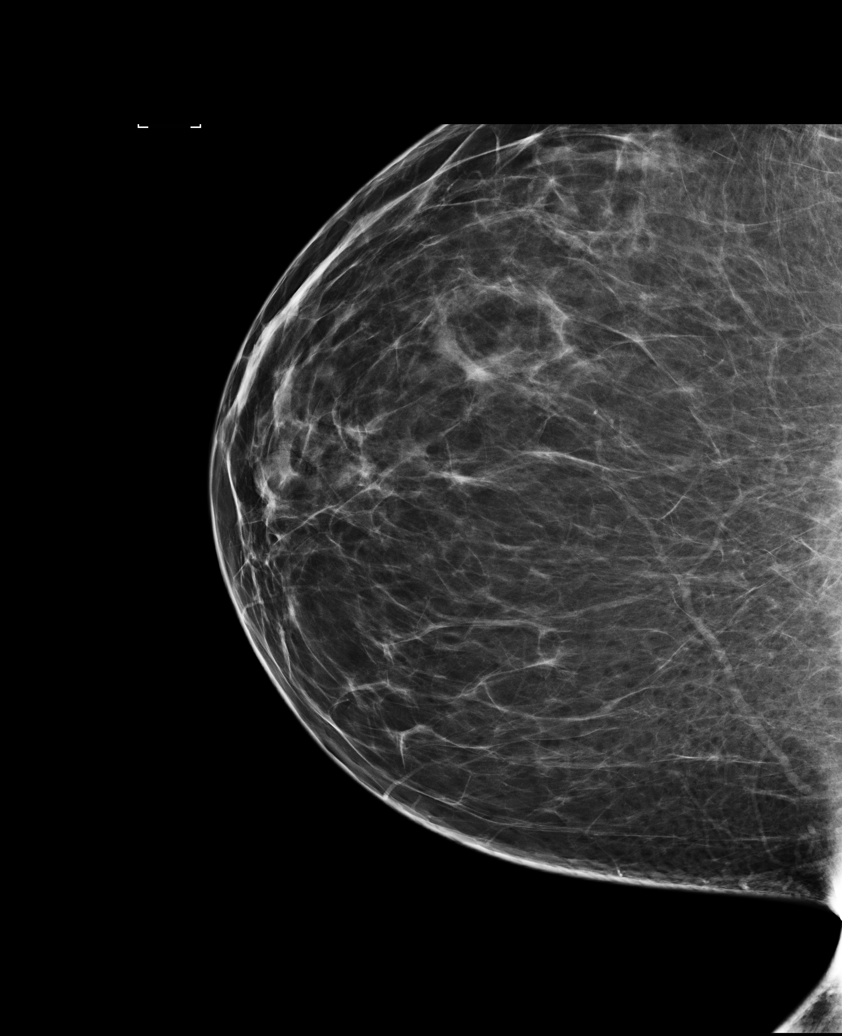

[L CC (2 of 2)]
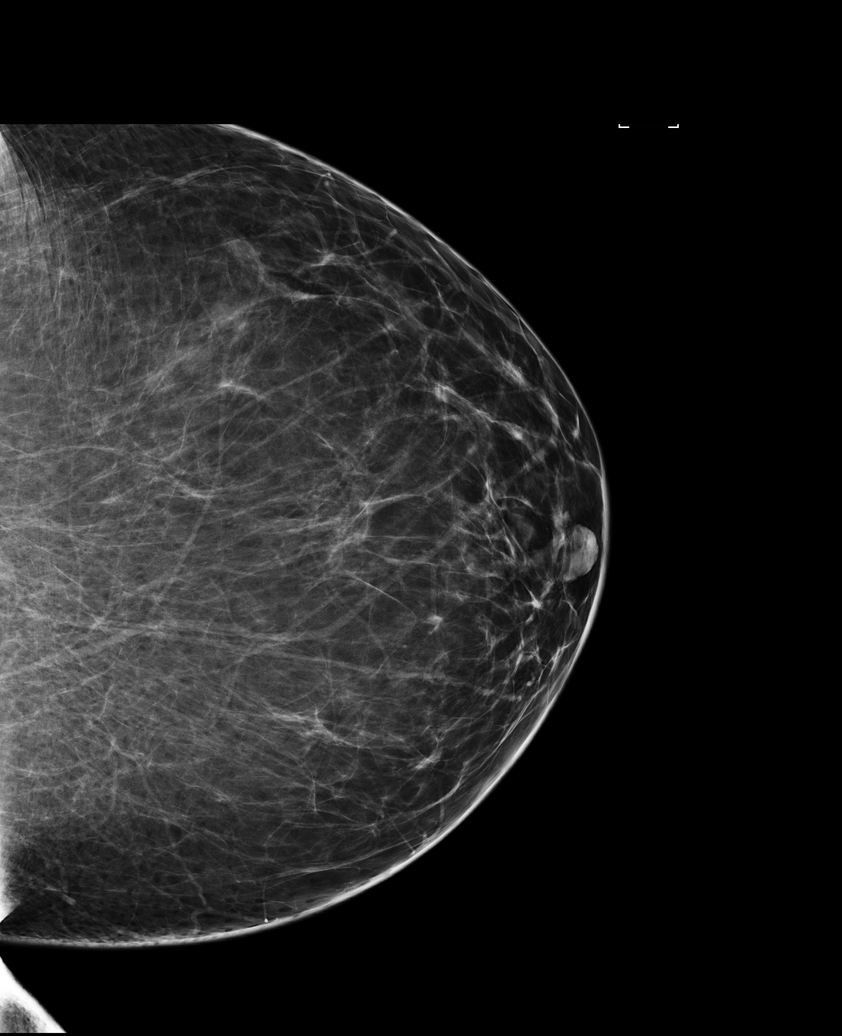

[R MLO]
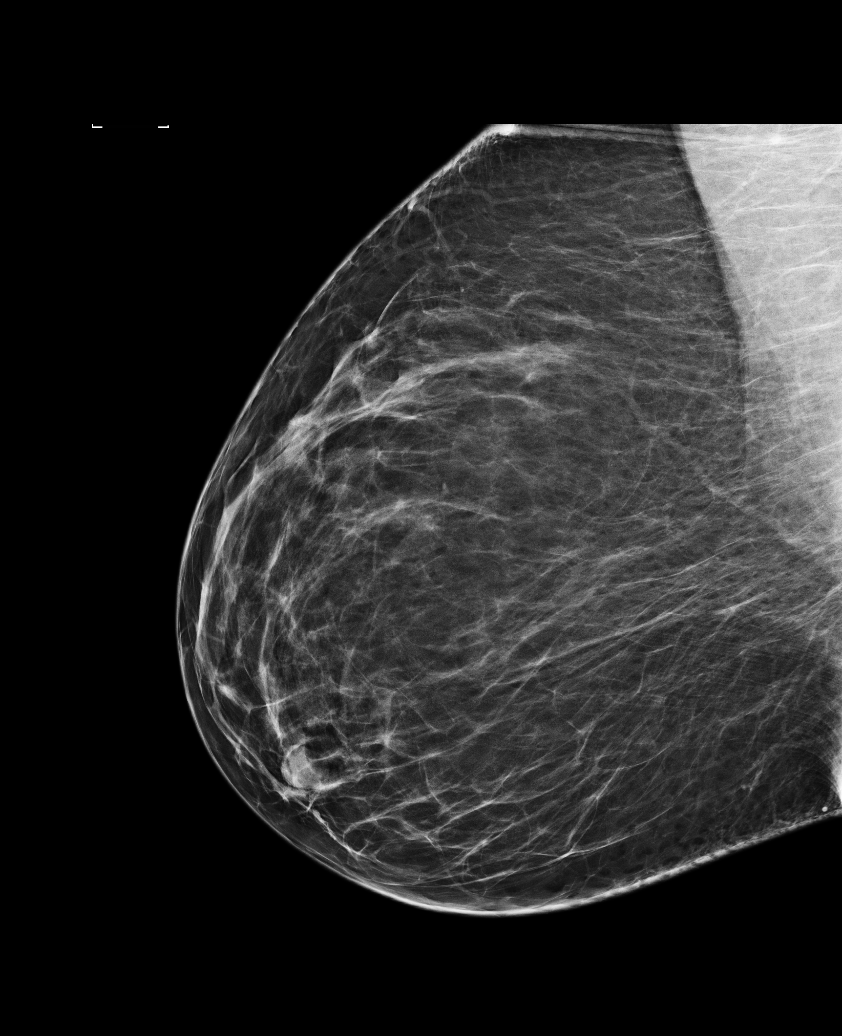

[L MLO]
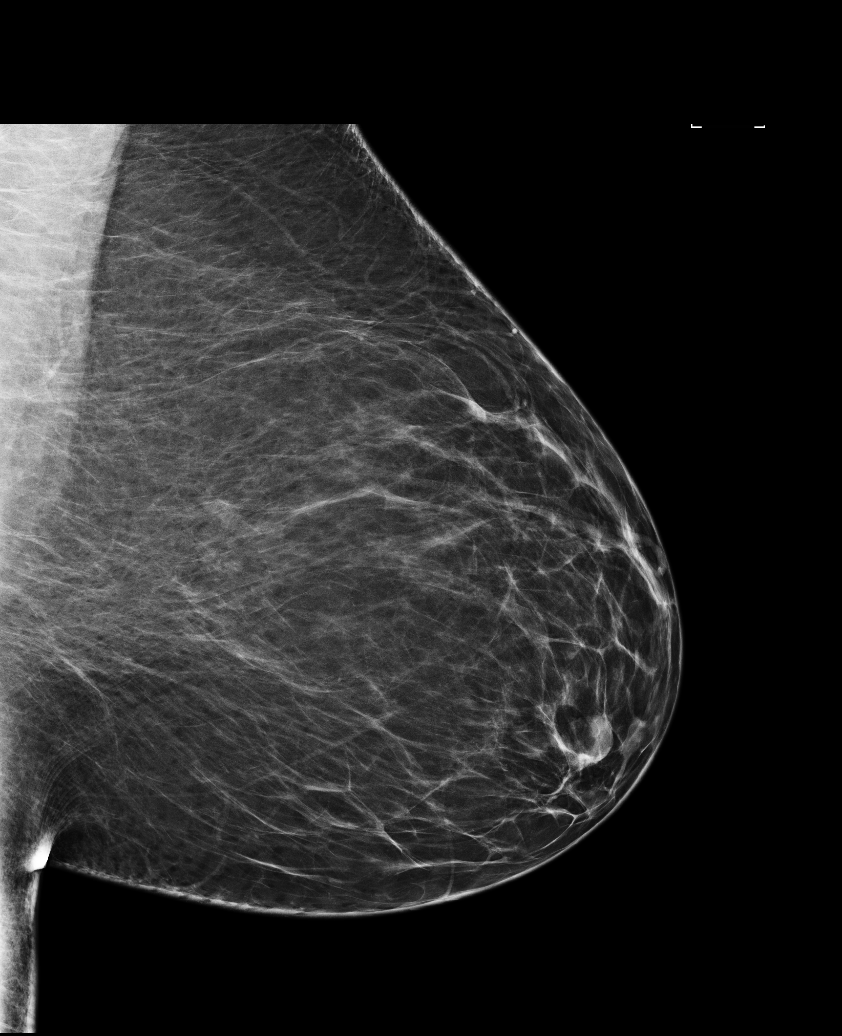

[6 of 6 positions shown; findings below may reference images not displayed]

ACR Breast Density Category b: There are scattered areas of
fibroglandular density.
FINDINGS: There are no findings suspicious for malignancy. Images were
processed with CAD.
IMPRESSION: No mammographic evidence of malignancy. A result letter of this
screening mammogram will be mailed directly to the patient.

RECOMMENDATION:
Screening mammogram in one year. (Code:5K-C-H55)

BI-RADS CATEGORY  1: Negative.

## 2021-11-10 ENCOUNTER — Encounter: Payer: Self-pay | Admitting: Nurse Practitioner

## 2021-11-10 ENCOUNTER — Other Ambulatory Visit: Payer: Self-pay

## 2021-11-10 ENCOUNTER — Ambulatory Visit: Payer: 59 | Admitting: Nurse Practitioner

## 2021-11-10 VITALS — BP 118/76 | HR 75 | Temp 97.0°F | Ht 64.0 in | Wt 258.4 lb

## 2021-11-10 DIAGNOSIS — G44209 Tension-type headache, unspecified, not intractable: Secondary | ICD-10-CM

## 2021-11-10 DIAGNOSIS — Z23 Encounter for immunization: Secondary | ICD-10-CM

## 2021-11-10 DIAGNOSIS — F458 Other somatoform disorders: Secondary | ICD-10-CM | POA: Insufficient documentation

## 2021-11-10 DIAGNOSIS — K2100 Gastro-esophageal reflux disease with esophagitis, without bleeding: Secondary | ICD-10-CM | POA: Insufficient documentation

## 2021-11-10 DIAGNOSIS — R09A2 Foreign body sensation, throat: Secondary | ICD-10-CM | POA: Insufficient documentation

## 2021-11-10 MED ORDER — EXCEDRIN EXTRA STRENGTH 250-250-65 MG PO TABS: 1.0000 | ORAL_TABLET | Freq: Three times a day (TID) | ORAL | 0 refills | Status: DC | PRN

## 2021-11-10 MED ORDER — OMEPRAZOLE 20 MG PO CPDR: DELAYED_RELEASE_CAPSULE | ORAL | 2 refills | Status: DC

## 2021-11-10 MED ORDER — CETIRIZINE HCL 10 MG PO TABS: 10.0000 mg | ORAL_TABLET | Freq: Every day | ORAL | 0 refills | Status: DC

## 2021-11-10 NOTE — Assessment & Plan Note (Signed)
Was able to loss 20lbs in 61months through exercise 3x/week with personal trainer and low carb diet. She struggles with staying consistent. She admits to unhealthy food choices. She is considering use of wegovy injection. We discussed possible medication side effects and its benefit. She verbalized understanding and decided not to use wegovy at this time due to severe GERD symptoms.  Advised to increase exercise routine to 30-53mins daily (combination of cardio and weight training), provided printed information on how to decrease daily calorie intake while maintaining high protein and fiber intake. She agreed to referral to weight management clinic F/up in 2weeks

## 2021-11-10 NOTE — Assessment & Plan Note (Signed)
Chronic, waxing and waning, worse in last 22months, located in occipital and frontal region, occurs 2-3times per week. Worse with increase anxiety or stress or long hours on her computer. Last eye exam this year (no abnormal finding) Resolve with excedrin 2tabs.  Advised to find ways to decrease stress, use bluelight glass when using computer, decrease computer brightness and continue use of excedrin prn.

## 2021-11-10 NOTE — Progress Notes (Signed)
Subjective:  Patient ID: Kathy Werner, female    DOB: 1976-12-02  Age: 45 y.o. MRN: [id number]  CC: Establish Care (Obesity, headache,GERD, PND )  HPI  Gastroesophageal reflux disease with esophagitis without hemorrhage Chronic, onset over 2years ago, waxing and waning, some improvement with Tums 2tabs. Associated with dysphagia with solid foods. No ABD pain, no nausea, no melena.  Start omeprazole as prescribed F/up in 2weeks  TENSION HEADACHE Chronic, waxing and waning, worse in last 36months, located in occipital and frontal region, occurs 2-3times per week. Worse with increase anxiety or stress or long hours on her computer. Last eye exam this year (no abnormal finding) Resolve with excedrin 2tabs.  Advised to find ways to decrease stress, use bluelight glass when using computer, decrease computer brightness and continue use of excedrin prn.  Morbid obesity ([place]) Was able to loss 20lbs in 58months through exercise 3x/week with personal trainer and low carb diet. Kathy Werner struggles with staying consistent. Kathy Werner admits to unhealthy food choices. Kathy Werner is considering use of wegovy injection. We discussed possible medication side effects and its benefit. Kathy Werner verbalized understanding and decided not to use wegovy at this time due to severe GERD symptoms.  Advised to increase exercise routine to 30-50mins daily (combination of cardio and weight training), provided printed information on how to decrease daily calorie intake while maintaining high protein and fiber intake. Kathy Werner agreed to referral to weight management clinic F/up in 2weeks  Reviewed past Medical, Social and Family history today.  Outpatient Medications Prior to Visit  Medication Sig Dispense Refill   Vitamin D, Cholecalciferol, 25 MCG (1000 UT) CAPS Take 1 capsule by mouth daily in the afternoon.     albuterol (VENTOLIN HFA) 108 (90 Base) MCG/ACT inhaler Inhale 1-2 puffs into the lungs every 6 (six) hours as needed for  wheezing or shortness of breath. (Patient not taking: Reported on 11/10/2021) 18 g 0   amoxicillin-clavulanate (AUGMENTIN) 875-125 MG tablet Take 1 tablet by mouth 2 (two) times daily. (Patient not taking: Reported on 11/10/2021) 20 tablet 0   No facility-administered medications prior to visit.    ROS See HPI  Objective:  BP 118/76 (BP Location: Left Arm, Patient Position: Sitting, Cuff Size: Large)   Pulse 75   Temp (!) 97 F (36.1 C) (Temporal)   Ht 5\' 4"  (1.626 m)   Wt 258 lb 6.4 oz (117.2 kg)   LMP 10/30/2021 (Exact Date)   SpO2 98%   BMI 44.35 kg/m   Physical Exam Constitutional:      Appearance: Kathy Werner is obese.  Eyes:     Extraocular Movements: Extraocular movements intact.     Conjunctiva/sclera: Conjunctivae normal.     Pupils: Pupils are equal, round, and reactive to light.  Cardiovascular:     Rate and Rhythm: Normal rate and regular rhythm.     Pulses: Normal pulses.     Heart sounds: Normal heart sounds.  Pulmonary:     Effort: Pulmonary effort is normal.     Breath sounds: Normal breath sounds.  Neurological:     Mental Status: Kathy Werner is alert and oriented to person, place, and time.   Assessment & Plan:  This visit occurred during the SARS-CoV-2 public health emergency.  Safety protocols were in place, including screening questions prior to the visit, additional usage of staff PPE, and extensive cleaning of exam room while observing appropriate contact time as indicated for disinfecting solutions.   Kathy Werner was seen today for establish care.  Diagnoses and all  orders for this visit:  Tension headache -     aspirin-acetaminophen-caffeine (EXCEDRIN EXTRA STRENGTH) 250-250-65 MG tablet; Take 1-2 tablets by mouth every 8 (eight) hours as needed for headache.  Need for diphtheria-tetanus-pertussis (Tdap) vaccine -     Tdap vaccine greater than or equal to 7yo IM  Gastroesophageal reflux disease with esophagitis without hemorrhage  Morbid obesity (HCC) -      omeprazole (PRILOSEC) 20 MG capsule; 1tab BID x 7days, then 1tab daily -     Amb Ref to Medical Weight Management  Globus hystericus -     cetirizine (ZYRTEC) 10 MG tablet; Take 1 tablet (10 mg total) by mouth at bedtime.   Problem List Items Addressed This Visit       Digestive   Gastroesophageal reflux disease with esophagitis without hemorrhage    Chronic, onset over 2years ago, waxing and waning, some improvement with Tums 2tabs. Associated with dysphagia with solid foods. No ABD pain, no nausea, no melena.  Start omeprazole as prescribed F/up in 2weeks        Other   Globus hystericus   Relevant Medications   cetirizine (ZYRTEC) 10 MG tablet   Morbid obesity (HCC)    Was able to loss 20lbs in 22months through exercise 3x/week with personal trainer and low carb diet. Kathy Werner struggles with staying consistent. Kathy Werner admits to unhealthy food choices. Kathy Werner is considering use of wegovy injection. We discussed possible medication side effects and its benefit. Kathy Werner verbalized understanding and decided not to use wegovy at this time due to severe GERD symptoms.  Advised to increase exercise routine to 30-45mins daily (combination of cardio and weight training), provided printed information on how to decrease daily calorie intake while maintaining high protein and fiber intake. Kathy Werner agreed to referral to weight management clinic F/up in 2weeks      Relevant Medications   omeprazole (PRILOSEC) 20 MG capsule   Other Relevant Orders   Amb Ref to Medical Weight Management   TENSION HEADACHE - Primary    Chronic, waxing and waning, worse in last 65months, located in occipital and frontal region, occurs 2-3times per week. Worse with increase anxiety or stress or long hours on her computer. Last eye exam this year (no abnormal finding) Resolve with excedrin 2tabs.  Advised to find ways to decrease stress, use bluelight glass when using computer, decrease computer brightness and continue use of  excedrin prn.      Relevant Medications   aspirin-acetaminophen-caffeine (EXCEDRIN EXTRA STRENGTH) 250-250-65 MG tablet   Other Visit Diagnoses     Need for diphtheria-tetanus-pertussis (Tdap) vaccine       Relevant Orders   Tdap vaccine greater than or equal to 7yo IM (Completed)       Follow-up: Return in about 2 weeks (around 11/24/2021) for CPE (fasting).  Wilfred Lacy, NP

## 2021-11-10 NOTE — Patient Instructions (Signed)
Use omeprazole and zyrtec to help with GERD symptoms and post nasal drainage. You will be contacted to schedule appt with weight management clinic. Increase exercise regimen to 67mind daily (cardio and weight training).  Gastroesophageal Reflux Disease, Adult Gastroesophageal reflux (GER) happens when acid from the stomach flows up into the tube that connects the mouth and the stomach (esophagus). Normally, food travels down the esophagus and stays in the stomach to be digested. With GER, food and stomach acid sometimes move back up into the esophagus. You may have a disease called gastroesophageal reflux disease (GERD) if the reflux: Happens often. Causes frequent or very bad symptoms. Causes problems such as damage to the esophagus. When this happens, the esophagus becomes sore and swollen. Over time, GERD can make small holes (ulcers) in the lining of the esophagus. What are the causes? This condition is caused by a problem with the muscle between the esophagus and the stomach. When this muscle is weak or not normal, it does not close properly to keep food and acid from coming back up from the stomach. The muscle can be weak because of: Tobacco use. Pregnancy. Having a certain type of hernia (hiatal hernia). Alcohol use. Certain foods and drinks, such as coffee, chocolate, onions, and peppermint. What increases the risk? Being overweight. Having a disease that affects your connective tissue. Taking NSAIDs, such a ibuprofen. What are the signs or symptoms? Heartburn. Difficult or painful swallowing. The feeling of having a lump in the throat. A bitter taste in the mouth. Bad breath. Having a lot of saliva. Having an upset or bloated stomach. Burping. Chest pain. Different conditions can cause chest pain. Make sure you see your doctor if you have chest pain. Shortness of breath or wheezing. A long-term cough or a cough at night. Wearing away of the surface of teeth (tooth  enamel). Weight loss. How is this treated? Making changes to your diet. Taking medicine. Having surgery. Treatment will depend on how bad your symptoms are. Follow these instructions at home: Eating and drinking  Follow a diet as told by your doctor. You may need to avoid foods and drinks such as: Coffee and tea, with or without caffeine. Drinks that contain alcohol. Energy drinks and sports drinks. Bubbly (carbonated) drinks or sodas. Chocolate and cocoa. Peppermint and mint flavorings. Garlic and onions. Horseradish. Spicy and acidic foods. These include peppers, chili powder, curry powder, vinegar, hot sauces, and BBQ sauce. Citrus fruit juices and citrus fruits, such as oranges, lemons, and limes. Tomato-based foods. These include red sauce, chili, salsa, and pizza with red sauce. Fried and fatty foods. These include donuts, french fries, potato chips, and high-fat dressings. High-fat meats. These include hot dogs, rib eye steak, sausage, ham, and bacon. High-fat dairy items, such as whole milk, butter, and cream cheese. Eat small meals often. Avoid eating large meals. Avoid drinking large amounts of liquid with your meals. Avoid eating meals during the 2-3 hours before bedtime. Avoid lying down right after you eat. Do not exercise right after you eat. Lifestyle  Do not smoke or use any products that contain nicotine or tobacco. If you need help quitting, ask your doctor. Try to lower your stress. If you need help doing this, ask your doctor. If you are overweight, lose an amount of weight that is healthy for you. Ask your doctor about a safe weight loss goal. General instructions Pay attention to any changes in your symptoms. Take over-the-counter and prescription medicines only as told by your doctor.  Do not take aspirin, ibuprofen, or other NSAIDs unless your doctor says it is okay. Wear loose clothes. Do not wear anything tight around your waist. Raise (elevate) the  head of your bed about 6 inches (15 cm). You may need to use a wedge to do this. Avoid bending over if this makes your symptoms worse. Keep all follow-up visits. Contact a doctor if: You have new symptoms. You lose weight and you do not know why. You have trouble swallowing or it hurts to swallow. You have wheezing or a cough that keeps happening. You have a hoarse voice. Your symptoms do not get better with treatment. Get help right away if: You have sudden pain in your arms, neck, jaw, teeth, or back. You suddenly feel sweaty, dizzy, or light-headed. You have chest pain or shortness of breath. You vomit and the vomit is green, yellow, or black, or it looks like blood or coffee grounds. You faint. Your poop (stool) is red, bloody, or black. You cannot swallow, drink, or eat. These symptoms may represent a serious problem that is an emergency. Do not wait to see if the symptoms will go away. Get medical help right away. Call your local emergency services (911 in the [place] Do not drive yourself to the hospital. Summary If a person has gastroesophageal reflux disease (GERD), food and stomach acid move back up into the esophagus and cause symptoms or problems such as damage to the esophagus. Treatment will depend on how bad your symptoms are. Follow a diet as told by your doctor. Take all medicines only as told by your doctor. This information is not intended to replace advice given to you by your health care provider. Make sure you discuss any questions you have with your health care provider. Document Revised: 06/09/2020 Document Reviewed: 06/09/2020 Elsevier Patient Education  [place] [employer] Calories are units of energy. Your body needs a certain number of calories from food to keep going throughout the day. When you eat or drink more calories than your body needs, your body stores the extra calories mostly as fat. When you eat or drink fewer  calories than your body needs, your body burns fat to get the energy it needs. Calorie counting means keeping track of how many calories you eat and drink each day. Calorie counting can be helpful if you need to lose weight. If you eat fewer calories than your body needs, you should lose weight. Ask your health care provider what a healthy weight is for you. For calorie counting to work, you will need to eat the right number of calories each day to lose a healthy amount of weight per week. A dietitian can help you figure out how many calories you need in a day and will suggest ways to reach your calorie goal. A healthy amount of weight to lose each week is usually 1-2 lb (0.5-0.9 kg). This usually means that your daily calorie intake should be reduced by 500-750 calories. Eating 1,200-1,500 calories a day can help most women lose weight. Eating 1,500-1,800 calories a day can help most men lose weight. What do I need to know about calorie counting? Work with your health care provider or dietitian to determine how many calories you should get each day. To meet your daily calorie goal, you will need to: Find out how many calories are in each food that you would like to eat. Try to do this before you eat. Decide how much  of the food you plan to eat. Keep a food log. Do this by writing down what you ate and how many calories it had. To successfully lose weight, it is important to balance calorie counting with a healthy lifestyle that includes regular activity. Where do I find calorie information? The number of calories in a food can be found on a Nutrition Facts label. If a food does not have a Nutrition Facts label, try to look up the calories online or ask your dietitian for help. Remember that calories are listed per serving. If you choose to have more than one serving of a food, you will have to multiply the calories per serving by the number of servings you plan to eat. For example, the label on a  package of bread might say that a serving size is 1 slice and that there are 90 calories in a serving. If you eat 1 slice, you will have eaten 90 calories. If you eat 2 slices, you will have eaten 180 calories. How do I keep a food log? After each time that you eat, record the following in your food log as soon as possible: What you ate. Be sure to include toppings, sauces, and other extras on the food. How much you ate. This can be measured in cups, ounces, or number of items. How many calories were in each food and drink. The total number of calories in the food you ate. Keep your food log near you, such as in a pocket-sized notebook or on an app or website on your mobile phone. Some programs will calculate calories for you and show you how many calories you have left to meet your daily goal. What are some portion-control tips? Know how many calories are in a serving. This will help you know how many servings you can have of a certain food. Use a measuring cup to measure serving sizes. You could also try weighing out portions on a kitchen scale. With time, you will be able to estimate serving sizes for some foods. Take time to put servings of different foods on your favorite plates or in your favorite bowls and cups so you know what a serving looks like. Try not to eat straight from a food's packaging, such as from a bag or box. Eating straight from the package makes it hard to see how much you are eating and can lead to overeating. Put the amount you would like to eat in a cup or on a plate to make sure you are eating the right portion. Use smaller plates, glasses, and bowls for smaller portions and to prevent overeating. Try not to multitask. For example, avoid watching TV or using your computer while eating. If it is time to eat, sit down at a table and enjoy your food. This will help you recognize when you are full. It will also help you be more mindful of what and how much you are eating. What  are tips for following this plan? Reading food labels Check the calorie count compared with the serving size. The serving size may be smaller than what you are used to eating. Check the source of the calories. Try to choose foods that are high in protein, fiber, and vitamins, and low in saturated fat, trans fat, and sodium. Shopping Read nutrition labels while you shop. This will help you make healthy decisions about which foods to buy. Pay attention to nutrition labels for low-fat or fat-free foods. These foods sometimes have the  same number of calories or more calories than the full-fat versions. They also often have added sugar, starch, or salt to make up for flavor that was removed with the fat. Make a grocery list of lower-calorie foods and stick to it. Cooking Try to cook your favorite foods in a healthier way. For example, try baking instead of frying. Use low-fat dairy products. Meal planning Use more fruits and vegetables. One-half of your plate should be fruits and vegetables. Include lean proteins, such as chicken, [place], and fish. Lifestyle Each week, aim to do one of the following: 150 minutes of moderate exercise, such as walking. 75 minutes of vigorous exercise, such as running. General information Know how many calories are in the foods you eat most often. This will help you calculate calorie counts faster. Find a way of tracking calories that works for you. Get creative. Try different apps or programs if writing down calories does not work for you. What foods should I eat?  Eat nutritious foods. It is better to have a nutritious, high-calorie food, such as an avocado, than a food with few nutrients, such as a bag of potato chips. Use your calories on foods and drinks that will fill you up and will not leave you hungry soon after eating. Examples of foods that fill you up are nuts and nut butters, vegetables, lean proteins, and high-fiber foods such as whole grains.  High-fiber foods are foods with more than 5 g of fiber per serving. Pay attention to calories in drinks. Low-calorie drinks include water and unsweetened drinks. The items listed above may not be a complete list of foods and beverages you can eat. Contact a dietitian for more information. What foods should I limit? Limit foods or drinks that are not good sources of vitamins, minerals, or protein or that are high in unhealthy fats. These include: Candy. Other sweets. Sodas, specialty coffee drinks, alcohol, and juice. The items listed above may not be a complete list of foods and beverages you should avoid. Contact a dietitian for more information. How do I count calories when eating out? Pay attention to portions. Often, portions are much larger when eating out. Try these tips to keep portions smaller: Consider sharing a meal instead of getting your own. If you get your own meal, eat only half of it. Before you start eating, ask for a container and put half of your meal into it. When available, consider ordering smaller portions from the menu instead of full portions. Pay attention to your food and drink choices. Knowing the way food is cooked and what is included with the meal can help you eat fewer calories. If calories are listed on the menu, choose the lower-calorie options. Choose dishes that include vegetables, fruits, whole grains, low-fat dairy products, and lean proteins. Choose items that are boiled, broiled, grilled, or steamed. Avoid items that are buttered, battered, fried, or served with cream sauce. Items labeled as crispy are usually fried, unless stated otherwise. Choose water, low-fat milk, unsweetened iced tea, or other drinks without added sugar. If you want an alcoholic beverage, choose a lower-calorie option, such as a glass of wine or light beer. Ask for dressings, sauces, and syrups on the side. These are usually high in calories, so you should limit the amount you eat. If  you want a salad, choose a garden salad and ask for grilled meats. Avoid extra toppings such as bacon, cheese, or fried items. Ask for the dressing on the side, or ask  for olive oil and vinegar or lemon to use as dressing. Estimate how many servings of a food you are given. Knowing serving sizes will help you be aware of how much food you are eating at restaurants. Where to find more information Centers for Disease Control and Prevention: http://www.wolf.info/ [place] Department of Agriculture: http://www.wilson-mendoza.org/ Summary Calorie counting means keeping track of how many calories you eat and drink each day. If you eat fewer calories than your body needs, you should lose weight. A healthy amount of weight to lose per week is usually 1-2 lb (0.5-0.9 kg). This usually means reducing your daily calorie intake by 500-750 calories. The number of calories in a food can be found on a Nutrition Facts label. If a food does not have a Nutrition Facts label, try to look up the calories online or ask your dietitian for help. Use smaller plates, glasses, and bowls for smaller portions and to prevent overeating. Use your calories on foods and drinks that will fill you up and not leave you hungry shortly after a meal. This information is not intended to replace advice given to you by your health care provider. Make sure you discuss any questions you have with your health care provider. Document Revised: 01/10/2020 Document Reviewed: 01/10/2020 Elsevier Patient Education  2022 [employer].

## 2021-11-10 NOTE — Assessment & Plan Note (Signed)
Chronic, onset over 2years ago, waxing and waning, some improvement with Tums 2tabs. Associated with dysphagia with solid foods. No ABD pain, no nausea, no melena.  Start omeprazole as prescribed F/up in 2weeks

## 2021-11-19 ENCOUNTER — Ambulatory Visit: Payer: 59 | Admitting: Nurse Practitioner

## 2021-11-19 ENCOUNTER — Encounter: Payer: Self-pay | Admitting: Nurse Practitioner

## 2021-11-19 ENCOUNTER — Other Ambulatory Visit: Payer: Self-pay

## 2021-11-19 VITALS — BP 100/62 | HR 91 | Temp 97.8°F | Resp 18 | Ht 64.0 in | Wt 258.0 lb

## 2021-11-19 DIAGNOSIS — R051 Acute cough: Secondary | ICD-10-CM | POA: Diagnosis not present

## 2021-11-19 DIAGNOSIS — R0982 Postnasal drip: Secondary | ICD-10-CM

## 2021-11-19 DIAGNOSIS — U071 COVID-19: Secondary | ICD-10-CM

## 2021-11-19 LAB — POC COVID19 BINAXNOW: SARS Coronavirus 2 Ag: POSITIVE — AB

## 2021-11-19 MED ORDER — BENZONATATE 200 MG PO CAPS
200.0000 mg | ORAL_CAPSULE | Freq: Two times a day (BID) | ORAL | 0 refills | Status: AC | PRN
Start: 2021-11-19 — End: 2021-11-29

## 2021-11-19 MED ORDER — FLUTICASONE PROPIONATE 50 MCG/ACT NA SUSP: 2.0000 | Freq: Every day | NASAL | 6 refills | Status: DC

## 2021-11-19 NOTE — Assessment & Plan Note (Signed)
Patient tested positive in office.  We did have a detailed discussion about antiviral treatment.  Given patient's age and only risk being body habitus patient decided to not take oral antiviral medications.  Did discuss with her signs and symptoms to watch out for when to seek urgent or emergent health care.  Also discussed guidelines regarding quarantine per CDC recommendations.  Patient was supplied with a work note for her employer.  Continue over-the-counter medications for symptom management.

## 2021-11-19 NOTE — Assessment & Plan Note (Signed)
Start Flonase.

## 2021-11-19 NOTE — Assessment & Plan Note (Signed)
Patient experiencing cough likely due to postnasal drip.  We will send in some Tessalon Perles to aid in this along with Flonase.

## 2021-11-19 NOTE — Progress Notes (Signed)
Acute Office Visit  Subjective:    Patient ID: Kathy Werner, female    DOB: [birth year], 45 y.o.   MRN: [id number]  Chief Complaint  Patient presents with   Sinus Problem    Sx started on 11/15/21-Was having a lot of sinus pressure/pain, facial and teeth pain-patient took Sudafed sinus/congestion and that helped. Now lingering cough, post nasal drainage, sore throat/burning sensation, hoarse, runny nose, stuffy nose. NO fever. Covid test negative on 11/16/21.     Patient is in today for Sinus Issue Symptoms started on Sunday 11/15/2021 Covid test on Monday that was negative Pfizer x2 and no booster Psuedofed with some relief   States Monday was the worst day. States called out Tuesday form work as she works at a [employer] around other people.   Past Medical History:  Diagnosis Date   Anemia    receiving iron infusion 01/07/2017   Fibroid    Menorrhagia 03/2014   Migraines    Vaginal Pap smear, abnormal    now nl since L:EEP2002    Past Surgical History:  Procedure Laterality Date   DILATATION & CURETTAGE/HYSTEROSCOPY WITH MYOSURE N/A 02/17/2015   Procedure: [place];  Surgeon: Eldred Manges, MD;  Location: [facility];  Service: Gynecology;  Laterality: N/A;   DILATATION & CURETTAGE/HYSTEROSCOPY WITH MYOSURE N/A 01/11/2017   Procedure: DILATATION & CURETTAGE/HYSTEROSCOPY WITH MYOSURE, 2.9 RESECTION OF  FIBROID;  Surgeon: Eldred Manges, MD;  Location: [facility];  Service: Gynecology;  Laterality: N/A;   DILITATION & CURRETTAGE/HYSTROSCOPY WITH HYDROTHERMAL ABLATION N/A 01/11/2017   Procedure: DILATATION & CURETTAGE/HYSTEROSCOPY WITH HYDROTHERMAL ABLATION;  Surgeon: Eldred Manges, MD;  Location: [facility];  Service: Gynecology;  Laterality: N/A;  Dr. Leo Grosser is asking that the HTA be completed before the resection  [place] HTA rep will be here    IR ANGIOGRAM PELVIS SELECTIVE OR SUPRASELECTIVE  01/03/2018   IR  ANGIOGRAM PELVIS SELECTIVE OR SUPRASELECTIVE  01/03/2018   IR ANGIOGRAM SELECTIVE EACH ADDITIONAL VESSEL  01/03/2018   IR ANGIOGRAM SELECTIVE EACH ADDITIONAL VESSEL  01/03/2018   IR EMBO TUMOR ORGAN ISCHEMIA INFARCT INC GUIDE ROADMAPPING  01/03/2018   IR RADIOLOGIST EVAL & MGMT  12/15/2017   IR RADIOLOGIST EVAL & MGMT  02/15/2018   IR RADIOLOGIST EVAL & MGMT  07/19/2018   IR US GUIDE VASC ACCESS RIGHT  01/03/2018   IUD REMOVAL  06/2013   due to worsening dysmenorrhea   MIRENA     Inserted 01-24-13   WISDOM TOOTH EXTRACTION      Family History  Problem Relation Age of Onset   Hypertension Paternal Grandmother    Sudden death Neg Hx    Hyperlipidemia Neg Hx    Heart attack Neg Hx    Diabetes Neg Hx     Social History   Socioeconomic History   Marital status: Single    Spouse name: Not on file   Number of children: Not on file   Years of education: Not on file   Highest education level: Not on file  Occupational History   Not on file  Tobacco Use   Smoking status: Never   Smokeless tobacco: Never  Vaping Use   Vaping Use: Never used  Substance and Sexual Activity   Alcohol use: Yes    Comment: occ   Drug use: No   Sexual activity: Yes    Partners: Male    Birth control/protection: Condom, Pill  Comment: Mirena inserted 01-24-13 removed 2014  Other Topics Concern   Not on file  Social History Narrative   Not on file   Social Determinants of Health   Financial Resource Strain: Not on file  Food Insecurity: Not on file  Transportation Needs: Not on file  Physical Activity: Not on file  Stress: Not on file  Social Connections: Not on file  Intimate Partner Violence: Not on file    Outpatient Medications Prior to Visit  Medication Sig Dispense Refill   aspirin-acetaminophen-caffeine (EXCEDRIN EXTRA STRENGTH) 5151801466 MG tablet Take 1-2 tablets by mouth every 8 (eight) hours as needed for headache. 30 tablet 0   cetirizine (ZYRTEC) 10 MG tablet Take 1 tablet (10 mg  total) by mouth at bedtime. 30 tablet 0   omeprazole (PRILOSEC) 20 MG capsule 1tab BID x 7days, then 1tab daily 60 capsule 2   Vitamin D, Cholecalciferol, 25 MCG (1000 UT) CAPS Take 1 capsule by mouth daily in the afternoon.     No facility-administered medications prior to visit.    No Known Allergies  Review of Systems  Constitutional:  Positive for appetite change. Negative for chills, fatigue and fever.  HENT:  Positive for congestion and postnasal drip. Negative for ear discharge, ear pain, rhinorrhea, sinus pressure, sinus pain and sore throat (scratchy).   Respiratory:  Positive for cough (clear). Negative for shortness of breath.   Cardiovascular:  Negative for chest pain.  Gastrointestinal:  Negative for abdominal pain, constipation, nausea and vomiting.  Musculoskeletal:  Negative for arthralgias and myalgias.  Neurological:  Negative for headaches.      Objective:    Physical Exam Vitals and nursing note reviewed.  Constitutional:      Appearance: She is obese.  HENT:     Right Ear: Tympanic membrane, ear canal and external ear normal.     Left Ear: Tympanic membrane, ear canal and external ear normal.     Nose:     Right Sinus: No maxillary sinus tenderness or frontal sinus tenderness.     Left Sinus: No maxillary sinus tenderness or frontal sinus tenderness.     Mouth/Throat:     Mouth: Mucous membranes are moist.     Comments: cobblestoning Cardiovascular:     Rate and Rhythm: Normal rate and regular rhythm.  Pulmonary:     Effort: Pulmonary effort is normal.     Breath sounds: Normal breath sounds.  Abdominal:     General: Bowel sounds are normal.  Lymphadenopathy:     Cervical: No cervical adenopathy.  Neurological:     Mental Status: She is alert.  Psychiatric:        Mood and Affect: Mood normal.        Behavior: Behavior normal.        Thought Content: Thought content normal.        Judgment: Judgment normal.    BP 100/62   Pulse 91   Temp 97.8  F (36.6 C)   Resp 18   Ht 5\' 4"  (1.626 m)   Wt 258 lb (117 kg)   LMP 10/30/2021 (Exact Date)   SpO2 98%   BMI 44.29 kg/m  Wt Readings from Last 3 Encounters:  11/19/21 258 lb (117 kg)  11/10/21 258 lb 6.4 oz (117.2 kg)  05/07/21 250 lb (113.4 kg)    Health Maintenance Due  Topic Date Due   Hepatitis C Screening  Never done   COVID-19 Vaccine (3 - Booster for Ages series) 03/09/2021  PAP SMEAR-Modifier  05/13/2021    There are no preventive care reminders to display for this patient.   Lab Results  Component Value Date   TSH 3.01 10/03/2019   Lab Results  Component Value Date   WBC 8.3 05/07/2021   HGB 12.8 05/07/2021   HCT 38.5 05/07/2021   MCV 93.9 05/07/2021   PLT 215 05/07/2021   Lab Results  Component Value Date   NA 140 05/07/2021   K 3.8 05/07/2021   CHLORIDE 109 08/22/2014   CO2 27 05/07/2021   GLUCOSE 99 05/07/2021   BUN 13 05/07/2021   CREATININE 0.91 05/07/2021   BILITOT 0.9 05/07/2021   ALKPHOS 84 05/07/2021   AST 23 05/07/2021   ALT 24 05/07/2021   PROT 6.9 05/07/2021   ALBUMIN 3.8 05/07/2021   CALCIUM 9.6 05/07/2021   ANIONGAP 7 05/07/2021   GFR 91.72 10/03/2019   Lab Results  Component Value Date   CHOL 217 (H) 10/03/2019   Lab Results  Component Value Date   HDL 50.10 10/03/2019   Lab Results  Component Value Date   LDLCALC 128 (H) 10/03/2019   Lab Results  Component Value Date   TRIG 198.0 (H) 10/03/2019   Lab Results  Component Value Date   CHOLHDL 4 10/03/2019   No results found for: HGBA1C     Assessment & Plan:   Problem List Items Addressed This Visit       Other   COVID-19    Patient tested positive in office.  We did have a detailed discussion about antiviral treatment.  Given patient's age and only risk being body habitus patient decided to not take oral antiviral medications.  Did discuss with her signs and symptoms to watch out for when to seek urgent or emergent health care.  Also discussed guidelines  regarding quarantine per CDC recommendations.  Patient was supplied with a work note for her employer.  Continue over-the-counter medications for symptom management.      PND (post-nasal drip)    Start Flonase.      Relevant Medications   fluticasone (FLONASE) 50 MCG/ACT nasal spray   Other Relevant Orders   POC COVID-19 (Completed)   Acute cough - Primary    Patient experiencing cough likely due to postnasal drip.  We will send in some Tessalon Perles to aid in this along with Flonase.      Relevant Medications   benzonatate (TESSALON) 200 MG capsule   Other Relevant Orders   POC COVID-19 (Completed)     No orders of the defined types were placed in this encounter.  This visit occurred during the SARS-CoV-2 public health emergency.  Safety protocols were in place, including screening questions prior to the visit, additional usage of staff PPE, and extensive cleaning of exam room while observing appropriate contact time as indicated for disinfecting solutions.   Romilda Garret, NP

## 2021-11-19 NOTE — Patient Instructions (Addendum)
Need to quarantine through 11/20/2021. If your symptoms are improving and you have been fever free for 24 hours without the use of fever reducing medications you can return to work but have to wear a mask around other people for 5 full days. Follow up if symptoms do not improve or get worse

## 2021-12-23 ENCOUNTER — Encounter: Payer: 59 | Admitting: Nurse Practitioner

## 2022-02-05 ENCOUNTER — Other Ambulatory Visit: Payer: Self-pay | Admitting: Nurse Practitioner

## 2022-03-22 ENCOUNTER — Ambulatory Visit: Payer: 59 | Admitting: Nurse Practitioner

## 2022-03-22 ENCOUNTER — Encounter: Payer: Self-pay | Admitting: Nurse Practitioner

## 2022-03-22 VITALS — BP 122/82 | HR 72 | Temp 96.7°F | Wt 257.2 lb

## 2022-03-22 DIAGNOSIS — J0100 Acute maxillary sinusitis, unspecified: Secondary | ICD-10-CM

## 2022-03-22 MED ORDER — AMOXICILLIN-POT CLAVULANATE 875-125 MG PO TABS: 1.0000 | ORAL_TABLET | Freq: Two times a day (BID) | ORAL | 0 refills | Status: DC

## 2022-03-22 NOTE — Progress Notes (Signed)
? ?Acute Office Visit ? ?Subjective:  ? ? Patient ID: Kathy Werner, female    DOB: 31-May-1976, 46 y.o.   MRN: [id number] ? ?Chief Complaint  ?Patient presents with  ? Sinusitis  ?  Pt c/o chronic sinus infection w/ congestion, sore throat, an sinus pressure x1 wk  ? ? ?HPI ?Patient is in today for congestion, sore throat and sinus pressure for 7 days.  ? ?UPPER RESPIRATORY TRACT INFECTION ? ?Fever: no ?Cough: no ?Shortness of breath: no ?Wheezing: no ?Chest pain: no ?Chest tightness: no ?Chest congestion: no ?Nasal congestion: yes ?Runny nose: yes ?Post nasal drip: yes ?Sneezing: yes ?Sore throat: yes ?Swollen glands: no ?Sinus pressure: yes ?Headache: no ?Face pain: yes ?Toothache: yes ?Ear pain: yes bilateral ?Ear pressure: yes bilateral ?Eyes red/itching:no ?Eye drainage/crusting: no  ?Vomiting: no ?Rash: no ?Fatigue: no ?Sick contacts: no ?Strep contacts: no  ?Context: stable ?Recurrent sinusitis: no ?Relief with OTC cold/cough medications: no  ?Treatments attempted: zyrtec, sinus relief, neti pot ? ?Past Medical History:  ?Diagnosis Date  ? Anemia   ? receiving iron infusion 01/07/2017  ? Fibroid   ? Menorrhagia 03/2014  ? Migraines   ? Vaginal Pap smear, abnormal   ? now nl since L:EEP2002  ? ? ?Past Surgical History:  ?Procedure Laterality Date  ? DILATATION & CURETTAGE/HYSTEROSCOPY WITH MYOSURE N/A 02/17/2015  ? Procedure: [place];  Surgeon: Eldred Manges, MD;  Location: [facility];  Service: Gynecology;  Laterality: N/A;  ? DILATATION & CURETTAGE/HYSTEROSCOPY WITH MYOSURE N/A 01/11/2017  ? Procedure: DILATATION & CURETTAGE/HYSTEROSCOPY WITH MYOSURE, 2.9 RESECTION OF  FIBROID;  Surgeon: Eldred Manges, MD;  Location: [facility];  Service: Gynecology;  Laterality: N/A;  ? DILITATION & CURRETTAGE/HYSTROSCOPY WITH HYDROTHERMAL ABLATION N/A 01/11/2017  ? Procedure: DILATATION & CURETTAGE/HYSTEROSCOPY WITH HYDROTHERMAL ABLATION;  Surgeon: Eldred Manges, MD;  Location: [facility];  Service: Gynecology;  Laterality: N/A;  Dr. Leo Grosser is asking that the HTA be completed before the resection  ?PAUL WILL BE HERE FOR THIS CASE ?[place] HTA rep will be here   ? IR ANGIOGRAM PELVIS SELECTIVE OR SUPRASELECTIVE  01/03/2018  ? IR ANGIOGRAM PELVIS SELECTIVE OR SUPRASELECTIVE  01/03/2018  ? IR ANGIOGRAM SELECTIVE EACH ADDITIONAL VESSEL  01/03/2018  ? IR ANGIOGRAM SELECTIVE EACH ADDITIONAL VESSEL  01/03/2018  ? IR EMBO TUMOR ORGAN ISCHEMIA INFARCT INC GUIDE ROADMAPPING  01/03/2018  ? IR RADIOLOGIST EVAL & MGMT  12/15/2017  ? IR RADIOLOGIST EVAL & MGMT  02/15/2018  ? IR RADIOLOGIST EVAL & MGMT  07/19/2018  ? IR US GUIDE VASC ACCESS RIGHT  01/03/2018  ? IUD REMOVAL  06/2013  ? due to worsening dysmenorrhea  ? MIRENA    ? Inserted 01-24-13  ? WISDOM TOOTH EXTRACTION    ? ? ?Family History  ?Problem Relation Age of Onset  ? Hypertension Paternal Grandmother   ? Sudden death Neg Hx   ? Hyperlipidemia Neg Hx   ? Heart attack Neg Hx   ? Diabetes Neg Hx   ? ? ?Social History  ? ?Socioeconomic History  ? Marital status: Single  ?  Spouse name: Not on file  ? Number of children: Not on file  ? Years of education: Not on file  ? Highest education level: Not on file  ?Occupational History  ? Not on file  ?Tobacco Use  ? Smoking status: Never  ? Smokeless tobacco: Never  ?Vaping Use  ? Vaping Use: Never used  ?Substance and Sexual Activity  ?  Alcohol use: Yes  ?  Comment: occ  ? Drug use: No  ? Sexual activity: Yes  ?  Partners: Male  ?  Birth control/protection: Condom, Pill  ?  Comment: Mirena inserted 01-24-13 removed 2014  ?Other Topics Concern  ? Not on file  ?Social History Narrative  ? Not on file  ? ?Social Determinants of Health  ? ?Financial Resource Strain: Not on file  ?Food Insecurity: Not on file  ?Transportation Needs: Not on file  ?Physical Activity: Not on file  ?Stress: Not on file  ?Social Connections: Not on file  ?Intimate Partner Violence: Not on file  ? ? ?Outpatient Medications Prior to Visit  ?Medication  Sig Dispense Refill  ? aspirin-acetaminophen-caffeine (EXCEDRIN EXTRA STRENGTH) 250-250-65 MG tablet Take 1-2 tablets by mouth every 8 (eight) hours as needed for headache. 30 tablet 0  ? cetirizine (ZYRTEC) 10 MG tablet Take 1 tablet (10 mg total) by mouth at bedtime. 30 tablet 0  ? fluticasone (FLONASE) 50 MCG/ACT nasal spray Place 2 sprays into both nostrils daily. 16 g 6  ? omeprazole (PRILOSEC) 20 MG capsule TAKE 1 CAPSULE BY MOUTH TWICE A DAY FOR X7 DAYS, THEN TAKE 1 CAPSULE DAILY 180 capsule 0  ? Vitamin D, Cholecalciferol, 25 MCG (1000 UT) CAPS Take 1 capsule by mouth daily in the afternoon.    ? ?No facility-administered medications prior to visit.  ? ? ?No Known Allergies ? ?Review of Systems ?See pertinent positives and negatives per HPI. ?   ?Objective:  ?  ?Physical Exam ?Vitals and nursing note reviewed.  ?Constitutional:   ?   General: She is not in acute distress. ?   Appearance: Normal appearance.  ?HENT:  ?   Head: Normocephalic.  ?   Comments: Tenderness with palpation to maxillary sinuses  ?   Right Ear: Tympanic membrane, ear canal and external ear normal.  ?   Left Ear: Tympanic membrane, ear canal and external ear normal.  ?   Mouth/Throat:  ?   Mouth: Mucous membranes are moist.  ?   Pharynx: Posterior oropharyngeal erythema present. No oropharyngeal exudate.  ?Eyes:  ?   Conjunctiva/sclera: Conjunctivae normal.  ?Cardiovascular:  ?   Rate and Rhythm: Normal rate and regular rhythm.  ?   Pulses: Normal pulses.  ?   Heart sounds: Normal heart sounds.  ?Pulmonary:  ?   Effort: Pulmonary effort is normal.  ?   Breath sounds: Normal breath sounds.  ?Musculoskeletal:  ?   Cervical back: Normal range of motion. No tenderness.  ?Lymphadenopathy:  ?   Cervical: No cervical adenopathy.  ?Skin: ?   General: Skin is warm.  ?Neurological:  ?   General: No focal deficit present.  ?   Mental Status: She is alert and oriented to person, place, and time.  ?Psychiatric:     ?   Mood and Affect: Mood normal.      ?   Behavior: Behavior normal.     ?   Thought Content: Thought content normal.     ?   Judgment: Judgment normal.  ? ? ?BP 122/82 (BP Location: Left Arm, Patient Position: Sitting, Cuff Size: Large)   Pulse 72   Temp (!) 96.7 ?F (35.9 ?C) (Temporal)   Wt 257 lb 3.2 oz (116.7 kg)   BMI 44.15 kg/m?  ?Wt Readings from Last 3 Encounters:  ?03/22/22 257 lb 3.2 oz (116.7 kg)  ?11/19/21 258 lb (117 kg)  ?11/10/21 258 lb 6.4 oz (117.2 kg)  ? ? ?  Health Maintenance Due  ?Topic Date Due  ? Hepatitis C Screening  Never done  ? COVID-19 Vaccine (3 - Booster for Pfizer series) 03/09/2021  ? PAP SMEAR-Modifier  05/13/2021  ? ? ?There are no preventive care reminders to display for this patient. ? ? ?Lab Results  ?Component Value Date  ? TSH 3.01 10/03/2019  ? ?Lab Results  ?Component Value Date  ? WBC 8.3 05/07/2021  ? HGB 12.8 05/07/2021  ? HCT 38.5 05/07/2021  ? MCV 93.9 05/07/2021  ? PLT 215 05/07/2021  ? ?Lab Results  ?Component Value Date  ? NA 140 05/07/2021  ? K 3.8 05/07/2021  ? CHLORIDE 109 08/22/2014  ? CO2 27 05/07/2021  ? GLUCOSE 99 05/07/2021  ? BUN 13 05/07/2021  ? CREATININE 0.91 05/07/2021  ? BILITOT 0.9 05/07/2021  ? ALKPHOS 84 05/07/2021  ? AST 23 05/07/2021  ? ALT 24 05/07/2021  ? PROT 6.9 05/07/2021  ? ALBUMIN 3.8 05/07/2021  ? CALCIUM 9.6 05/07/2021  ? ANIONGAP 7 05/07/2021  ? GFR 91.72 10/03/2019  ? ?Lab Results  ?Component Value Date  ? CHOL 217 (H) 10/03/2019  ? ?Lab Results  ?Component Value Date  ? HDL 50.10 10/03/2019  ? ?Lab Results  ?Component Value Date  ? [place] 128 (H) 10/03/2019  ? ?Lab Results  ?Component Value Date  ? TRIG 198.0 (H) 10/03/2019  ? ?Lab Results  ?Component Value Date  ? CHOLHDL 4 10/03/2019  ? ?No results found for: HGBA1C ? ?   ?Assessment & Plan:  ? ?Problem List Items Addressed This Visit   ?None ?Visit Diagnoses   ? ? Acute non-recurrent maxillary sinusitis    -  Primary  ? Ongoing symptoms for 7 days. Will start augmentin BID x10 days. Continue OTC medications and neti  pot. F/U if symtpoms don't improve or worsen  ? Relevant Medications  ? amoxicillin-clavulanate (AUGMENTIN) 875-125 MG tablet  ? ?  ? ? ?Meds ordered this encounter  ?Medications  ? amoxicillin-clavulanat

## 2022-03-22 NOTE — Patient Instructions (Addendum)
It was great to see you! ? ?Start augmentin twice a day for 10 days. You can keep taking over the counter medication and using sinus rinse.  ? ?Let's follow-up if your symptoms don't improve or worsen.  ? ?Take care, ? ?Vance Peper, NP ? ?

## 2022-10-28 ENCOUNTER — Encounter: Payer: Self-pay | Admitting: Nurse Practitioner

## 2022-10-28 ENCOUNTER — Ambulatory Visit: Payer: 59 | Admitting: Nurse Practitioner

## 2022-10-28 VITALS — BP 126/89 | HR 80 | Temp 97.3°F | Ht 64.0 in | Wt 259.0 lb

## 2022-10-28 DIAGNOSIS — M545 Low back pain, unspecified: Secondary | ICD-10-CM | POA: Insufficient documentation

## 2022-10-28 DIAGNOSIS — G8929 Other chronic pain: Secondary | ICD-10-CM | POA: Diagnosis not present

## 2022-10-28 DIAGNOSIS — M5442 Lumbago with sciatica, left side: Secondary | ICD-10-CM

## 2022-10-28 MED ORDER — METHOCARBAMOL 750 MG PO TABS: 750.0000 mg | ORAL_TABLET | Freq: Three times a day (TID) | ORAL | 0 refills | Status: DC | PRN

## 2022-10-28 MED ORDER — KETOROLAC TROMETHAMINE 60 MG/2ML IM SOLN
60.0000 mg | Freq: Once | INTRAMUSCULAR | Status: AC
  Administered 2022-10-28: 60 mg via INTRAMUSCULAR

## 2022-10-28 MED ORDER — PREDNISONE 20 MG PO TABS: ORAL_TABLET | ORAL | 0 refills | Status: AC

## 2022-10-28 NOTE — Patient Instructions (Addendum)
5000IU daily Vit. D  Chronic Back Pain When back pain lasts longer than 3 months, it is called chronic back pain. Pain may get worse at certain times (flare-ups). There are things you can do at home to manage your pain. Follow these instructions at home: Pay attention to any changes in your symptoms. Take these actions to help with your pain: Managing pain and stiffness     If told, put ice on the painful area. Your doctor may tell you to use ice for 24-48 hours after the flare-up starts. To do this: Put ice in a plastic bag. Place a towel between your skin and the bag. Leave the ice on for 20 minutes, 2-3 times a day. If told, put heat on the painful area. Do this as often as told by your doctor. Use the heat source that your doctor recommends, such as a moist heat pack or a heating pad. Place a towel between your skin and the heat source. Leave the heat on for 20-30 minutes. Take off the heat if your skin turns bright red. This is especially important if you are unable to feel pain, heat, or cold. You may have a greater risk of getting burned. Soak in a warm bath. This can help relieve pain. Activity  Avoid bending and other activities that make pain worse. When standing: Keep your upper back and neck straight. Keep your shoulders pulled back. Avoid slouching. When sitting: Keep your back straight. Relax your shoulders. Do not round your shoulders or pull them backward. Do not sit or stand in one place for long periods of time. Take short rest breaks during the day. Lying down or standing is usually better than sitting. Resting can help relieve pain. When sitting or lying down for a long time, do some mild activity or stretching. This will help to prevent stiffness and pain. Get regular exercise. Ask your doctor what activities are safe for you. Do not lift anything that is heavier than 10 lb (4.5 kg) or the limit that you are told, until your doctor says that it is safe. To  prevent injury when you lift things: Bend your knees. Keep the weight close to your body. Avoid twisting. Sleep on a firm mattress. Try lying on your side with your knees slightly bent. If you lie on your back, put a pillow under your knees. Medicines Treatment may include medicines for pain and swelling taken by mouth or put on the skin, prescription pain medicine, or muscle relaxants. Take over-the-counter and prescription medicines only as told by your doctor. Ask your doctor if the medicine prescribed to you: Requires you to avoid driving or using machinery. Can cause trouble pooping (constipation). You may need to take these actions to prevent or treat trouble pooping: Drink enough fluid to keep your pee (urine) pale yellow. Take over-the-counter or prescription medicines. Eat foods that are high in fiber. These include beans, whole grains, and fresh fruits and vegetables. Limit foods that are high in fat and sugars. These include fried or sweet foods. General instructions Do not use any products that contain nicotine or tobacco, such as cigarettes, e-cigarettes, and chewing tobacco. If you need help quitting, ask your doctor. Keep all follow-up visits as told by your doctor. This is important. Contact a doctor if: Your pain does not get better with rest or medicine. Your pain gets worse, or you have new pain. You have a high fever. You lose weight very quickly. You have trouble doing your normal  activities. Get help right away if: One or both of your legs or feet feel weak. One or both of your legs or feet lose feeling (have numbness). You have trouble controlling when you poop (have a bowel movement) or pee (urinate). You have bad back pain and: You feel like you may vomit (nauseous), or you vomit. You have pain in your belly (abdomen). You have shortness of breath. You faint. Summary When back pain lasts longer than 3 months, it is called chronic back pain. Pain may get  worse at certain times (flare-ups). Use ice and heat as told by your doctor. Your doctor may tell you to use ice after flare-ups. This information is not intended to replace advice given to you by your health care provider. Make sure you discuss any questions you have with your health care provider. Document Revised: 01/09/2020 Document Reviewed: 01/09/2020 Elsevier Patient Education  [employer].

## 2022-10-28 NOTE — Progress Notes (Signed)
Established Patient Visit  Patient: Kathy Werner   DOB: 04/29/1976   46 y.o. Female  MRN: [id number] Visit Date: 10/28/2022  Subjective:    Chief Complaint  Patient presents with   Acute Visit    Sciatic nerve pain on left side x 2 weeks  Requesting record for pap   HPI Chronic left-sided low back pain with left-sided sciatica 1st episode 35yr ago, improved with oral prednisone. Onset of current symptoms 2weeks ago. Denies nay back injury. Pain is worse with laying on left side and supine. No paresthesia or weakness or change in GU/G function.  Administered toradol IM Sent oral prednisone and robaxin Educated about Ed precautions  Reviewed medical, surgical, and social history today  Medications: Outpatient Medications Prior to Visit  Medication Sig   aspirin-acetaminophen-caffeine (EXCEDRIN EXTRA STRENGTH) 250-250-65 MG tablet Take 1-2 tablets by mouth every 8 (eight) hours as needed for headache.   cetirizine (ZYRTEC) 10 MG tablet Take 1 tablet (10 mg total) by mouth at bedtime.   fluticasone (FLONASE) 50 MCG/ACT nasal spray Place 2 sprays into both nostrils daily.   omeprazole (PRILOSEC) 20 MG capsule TAKE 1 CAPSULE BY MOUTH TWICE A DAY FOR X7 DAYS, THEN TAKE 1 CAPSULE DAILY   [DISCONTINUED] amoxicillin-clavulanate (AUGMENTIN) 875-125 MG tablet Take 1 tablet by mouth 2 (two) times daily. (Patient not taking: Reported on 10/28/2022)   [DISCONTINUED] Vitamin D, Cholecalciferol, 25 MCG (1000 UT) CAPS Take 1 capsule by mouth daily in the afternoon. (Patient not taking: Reported on 10/28/2022)   No facility-administered medications prior to visit.   Reviewed past medical and social history.   ROS per HPI above      Objective:  BP 126/89 (BP Location: Right Arm, Patient Position: Sitting, Cuff Size: Normal)   Pulse 80   Temp (!) 97.3 F (36.3 C) (Temporal)   Ht '5\' 4"'$  (1.626 m)   Wt 259 lb (117.5 kg)   LMP 10/19/2022 (Exact Date)   SpO2 98%   BMI  44.46 kg/m      Physical Exam Vitals reviewed.  Cardiovascular:     Rate and Rhythm: Normal rate.     Pulses: Normal pulses.  Pulmonary:     Effort: Pulmonary effort is normal.  Abdominal:     General: Bowel sounds are normal.     Palpations: Abdomen is soft.  Musculoskeletal:        General: Tenderness present. No signs of injury.     Thoracic back: Normal.     Lumbar back: Spasms and tenderness present. No bony tenderness. Decreased range of motion. Negative right straight leg raise test and negative left straight leg raise test.     Right hip: Normal.     Left hip: Normal.     Right upper leg: Normal.     Left upper leg: Normal.     Right knee: Normal.     Left knee: Normal.     Right lower leg: Normal. No edema.     Left lower leg: Normal. No edema.  Skin:    Findings: No rash.  Neurological:     Mental Status: She is alert and oriented to person, place, and time.     No results found for any visits on 10/28/22.    Assessment & Plan:    Problem List Items Addressed This Visit       Nervous and Auditory   Chronic left-sided low back  pain with left-sided sciatica - Primary    1st episode 29yr ago, improved with oral prednisone. Onset of current symptoms 2weeks ago. Denies nay back injury. Pain is worse with laying on left side and supine. No paresthesia or weakness or change in GU/G function.  Administered toradol IM Sent oral prednisone and robaxin Educated about Ed precautions      Relevant Medications   predniSONE (DELTASONE) 20 MG tablet   methocarbamol (ROBAXIN-750) 750 MG tablet   Return in about 4 weeks (around 11/25/2022) for CPE (fasting).     CWilfred Lacy NP

## 2022-10-28 NOTE — Assessment & Plan Note (Signed)
1st episode 23yr ago, improved with oral prednisone. Onset of current symptoms 2weeks ago. Denies nay back injury. Pain is worse with laying on left side and supine. No paresthesia or weakness or change in GU/G function.  Administered toradol IM Sent oral prednisone and robaxin Educated about Ed precautions

## 2023-01-06 ENCOUNTER — Ambulatory Visit (INDEPENDENT_AMBULATORY_CARE_PROVIDER_SITE_OTHER): Payer: 59 | Admitting: Nurse Practitioner

## 2023-01-06 ENCOUNTER — Encounter: Payer: Self-pay | Admitting: Nurse Practitioner

## 2023-01-06 VITALS — BP 118/80 | HR 76 | Temp 97.3°F | Ht 64.0 in | Wt 259.4 lb

## 2023-01-06 DIAGNOSIS — Z13220 Encounter for screening for lipoid disorders: Secondary | ICD-10-CM

## 2023-01-06 DIAGNOSIS — D500 Iron deficiency anemia secondary to blood loss (chronic): Secondary | ICD-10-CM

## 2023-01-06 DIAGNOSIS — Z136 Encounter for screening for cardiovascular disorders: Secondary | ICD-10-CM

## 2023-01-06 DIAGNOSIS — Z1231 Encounter for screening mammogram for malignant neoplasm of breast: Secondary | ICD-10-CM

## 2023-01-06 DIAGNOSIS — Z1159 Encounter for screening for other viral diseases: Secondary | ICD-10-CM

## 2023-01-06 DIAGNOSIS — E559 Vitamin D deficiency, unspecified: Secondary | ICD-10-CM | POA: Diagnosis not present

## 2023-01-06 DIAGNOSIS — Z0001 Encounter for general adult medical examination with abnormal findings: Secondary | ICD-10-CM | POA: Diagnosis not present

## 2023-01-06 NOTE — Patient Instructions (Signed)
Go to lab Maintain daily exercise and heart healthy diet  Preventive Care 45-45 Years Old, Female Preventive care refers to lifestyle choices and visits with your health care provider that can promote health and wellness. Preventive care visits are also called wellness exams. What can I expect for my preventive care visit? Counseling Your health care provider may ask you questions about your: Medical history, including: Past medical problems. Family medical history. Pregnancy history. Current health, including: Menstrual cycle. Method of birth control. Emotional well-being. Home life and relationship well-being. Sexual activity and sexual health. Lifestyle, including: Alcohol, nicotine or tobacco, and drug use. Access to firearms. Diet, exercise, and sleep habits. Work and work Statistician. Sunscreen use. Safety issues such as seatbelt and bike helmet use. Physical exam Your health care provider will check your: Height and weight. These may be used to calculate your BMI (body mass index). BMI is a measurement that tells if you are at a healthy weight. Waist circumference. This measures the distance around your waistline. This measurement also tells if you are at a healthy weight and may help predict your risk of certain diseases, such as type 2 diabetes and high blood pressure. Heart rate and blood pressure. Body temperature. Skin for abnormal spots. What immunizations do I need?  Vaccines are usually given at various ages, according to a schedule. Your health care provider will recommend vaccines for you based on your age, medical history, and lifestyle or other factors, such as travel or where you work. What tests do I need? Screening Your health care provider may recommend screening tests for certain conditions. This may include: Lipid and cholesterol levels. Diabetes screening. This is done by checking your blood sugar (glucose) after you have not eaten for a while  (fasting). Pelvic exam and Pap test. Hepatitis B test. Hepatitis C test. HIV (human immunodeficiency virus) test. STI (sexually transmitted infection) testing, if you are at risk. Lung cancer screening. Colorectal cancer screening. Mammogram. Talk with your health care provider about when you should start having regular mammograms. This may depend on whether you have a family history of breast cancer. BRCA-related cancer screening. This may be done if you have a family history of breast, ovarian, tubal, or peritoneal cancers. Bone density scan. This is done to screen for osteoporosis. Talk with your health care provider about your test results, treatment options, and if necessary, the need for more tests. Follow these instructions at home: Eating and drinking  Eat a diet that includes fresh fruits and vegetables, whole grains, lean protein, and low-fat dairy products. Take vitamin and mineral supplements as recommended by your health care provider. Do not drink alcohol if: Your health care provider tells you not to drink. You are pregnant, may be pregnant, or are planning to become pregnant. If you drink alcohol: Limit how much you have to 0-1 drink a day. Know how much alcohol is in your drink. In the [place] one drink equals one 12 oz bottle of beer (355 mL), one 5 oz glass of wine (148 mL), or one 1 oz glass of hard liquor (44 mL). Lifestyle Brush your teeth every morning and night with fluoride toothpaste. Floss one time each day. Exercise for at least 30 minutes 5 or more days each week. Do not use any products that contain nicotine or tobacco. These products include cigarettes, chewing tobacco, and vaping devices, such as e-cigarettes. If you need help quitting, ask your health care provider. Do not use drugs. If you are sexually active, practice  safe sex. Use a condom or other form of protection to prevent STIs. If you do not wish to become pregnant, use a form of birth control. If  you plan to become pregnant, see your health care provider for a prepregnancy visit. Take aspirin only as told by your health care provider. Make sure that you understand how much to take and what form to take. Work with your health care provider to find out whether it is safe and beneficial for you to take aspirin daily. Find healthy ways to manage stress, such as: Meditation, yoga, or listening to music. Journaling. Talking to a trusted person. Spending time with friends and family. Minimize exposure to UV radiation to reduce your risk of skin cancer. Safety Always wear your seat belt while driving or riding in a vehicle. Do not drive: If you have been drinking alcohol. Do not ride with someone who has been drinking. When you are tired or distracted. While texting. If you have been using any mind-altering substances or drugs. Wear a helmet and other protective equipment during sports activities. If you have firearms in your house, make sure you follow all gun safety procedures. Seek help if you have been physically or sexually abused. What's next? Visit your health care provider once a year for an annual wellness visit. Ask your health care provider how often you should have your eyes and teeth checked. Stay up to date on all vaccines. This information is not intended to replace advice given to you by your health care provider. Make sure you discuss any questions you have with your health care provider. Document Revised: 05/27/2021 Document Reviewed: 05/27/2021 Elsevier Patient Education  [place].

## 2023-01-06 NOTE — Progress Notes (Signed)
Complete physical exam  Patient: Kathy Werner   DOB: [birth year]   47 y.o. Female  MRN: [id number] Visit Date: 01/06/2023  Subjective:    Chief Complaint  Patient presents with   Annual Exam    CPE Pt not fasting  Dizziness/ lightheadedness      Kathy Werner is a 47 y.o. female who presents today for a complete physical exam. She reports consuming a general diet. Gym/ health club routine includes cardio. She generally feels well. She reports sleeping well. She does have additional problems to discuss today.  Vision:No Dental:Yes STD Screen:No  BP Readings from Last 3 Encounters:  01/06/23 118/80  10/28/22 126/89  03/22/22 122/82    Wt Readings from Last 3 Encounters:  01/06/23 259 lb 6.4 oz (117.7 kg)  10/28/22 259 lb (117.5 kg)  03/22/22 257 lb 3.2 oz (116.7 kg)   Most recent fall risk assessment:    11/10/2021   10:39 AM  Fall Risk   Falls in the past year? 0  Number falls in past yr: 0  Injury with Fall? 0  Risk for fall due to : No Fall Risks  Follow up Falls evaluation completed   Depression screen:Yes - No Depression  Most recent depression screenings:    01/06/2023    2:53 PM 10/28/2022   12:03 PM  PHQ 2/9 Scores  PHQ - 2 Score 0 2  PHQ- 9 Score 2 4   HPI  No problem-specific Assessment & Plan notes found for this encounter.   Past Medical History:  Diagnosis Date   Anemia    receiving iron infusion 01/07/2017   Fibroid    Menorrhagia 03/2014   Migraines    Vaginal Pap smear, abnormal    now nl since [id number]   Past Surgical History:  Procedure Laterality Date   DILATATION & CURETTAGE/HYSTEROSCOPY WITH MYOSURE N/A 02/17/2015   Procedure: [place];  Surgeon: Eldred Manges, MD;  Location: [facility];  Service: Gynecology;  Laterality: N/A;   DILATATION & CURETTAGE/HYSTEROSCOPY WITH MYOSURE N/A 01/11/2017   Procedure: DILATATION & CURETTAGE/HYSTEROSCOPY WITH MYOSURE, 2.9 RESECTION OF  FIBROID;   Surgeon: Eldred Manges, MD;  Location: [facility];  Service: Gynecology;  Laterality: N/A;   DILITATION & CURRETTAGE/HYSTROSCOPY WITH HYDROTHERMAL ABLATION N/A 01/11/2017   Procedure: DILATATION & CURETTAGE/HYSTEROSCOPY WITH HYDROTHERMAL ABLATION;  Surgeon: Eldred Manges, MD;  Location: [facility];  Service: Gynecology;  Laterality: N/A;  Dr. Leo Grosser is asking that the HTA be completed before the resection  [place] HTA rep will be here    IR ANGIOGRAM PELVIS SELECTIVE OR SUPRASELECTIVE  01/03/2018   IR ANGIOGRAM PELVIS SELECTIVE OR SUPRASELECTIVE  01/03/2018   IR ANGIOGRAM SELECTIVE EACH ADDITIONAL VESSEL  01/03/2018   IR ANGIOGRAM SELECTIVE EACH ADDITIONAL VESSEL  01/03/2018   IR EMBO TUMOR ORGAN ISCHEMIA INFARCT INC GUIDE ROADMAPPING  01/03/2018   IR RADIOLOGIST EVAL & MGMT  12/15/2017   IR RADIOLOGIST EVAL & MGMT  02/15/2018   IR RADIOLOGIST EVAL & MGMT  07/19/2018   IR US GUIDE VASC ACCESS RIGHT  01/03/2018   IUD REMOVAL  06/2013   due to worsening dysmenorrhea   MIRENA     Inserted 01-24-13   WISDOM TOOTH EXTRACTION     Social History   Socioeconomic History   Marital status: Single    Spouse name: Not on file   Number of children: Not on file   Years of education:  Not on file   Highest education level: Not on file  Occupational History   Not on file  Tobacco Use   Smoking status: Never   Smokeless tobacco: Never  Vaping Use   Vaping Use: Never used  Substance and Sexual Activity   Alcohol use: Yes    Comment: occ   Drug use: No   Sexual activity: Yes    Partners: Male    Birth control/protection: Condom, Pill    Comment: Mirena inserted 01-24-13 removed 2014  Other Topics Concern   Not on file  Social History Narrative   Not on file   Social Determinants of Health   Financial Resource Strain: Not on file  Food Insecurity: Not on file  Transportation Needs: Not on file  Physical Activity: Not on file  Stress: Not on file  Social Connections:  Not on file  Intimate Partner Violence: Not on file   Family Status  Relation Name Status   PGM  (Not Specified)   Neg Hx  (Not Specified)   Family History  Problem Relation Age of Onset   Hypertension Paternal Grandmother    Sudden death Neg Hx    Hyperlipidemia Neg Hx    Heart attack Neg Hx    Diabetes Neg Hx    No Known Allergies  Patient Care Team: Henrine Hayter, Charlene Brooke, NP as PCP - General (Internal Medicine) Ob/Gyn, [place] (Obstetrics and Gynecology)   Medications: Outpatient Medications Prior to Visit  Medication Sig   aspirin-acetaminophen-caffeine (EXCEDRIN EXTRA STRENGTH) [phone] MG tablet Take 1-2 tablets by mouth every 8 (eight) hours as needed for headache.   cetirizine (ZYRTEC) 10 MG tablet Take 1 tablet (10 mg total) by mouth at bedtime.   fluticasone (FLONASE) 50 MCG/ACT nasal spray Place 2 sprays into both nostrils daily.   methocarbamol (ROBAXIN-750) 750 MG tablet Take 1 tablet (750 mg total) by mouth every 8 (eight) hours as needed for muscle spasms.   omeprazole (PRILOSEC) 20 MG capsule TAKE 1 CAPSULE BY MOUTH TWICE A DAY FOR X7 DAYS, THEN TAKE 1 CAPSULE DAILY (Patient not taking: Reported on 01/06/2023)   No facility-administered medications prior to visit.    Review of Systems  Constitutional:  Negative for fever.  HENT:  Negative for congestion and sore throat.   Eyes:        Negative for visual changes  Respiratory:  Negative for cough and shortness of breath.   Cardiovascular:  Negative for chest pain, palpitations and leg swelling.  Gastrointestinal:  Negative for blood in stool, constipation and diarrhea.  Genitourinary:  Negative for dysuria, frequency and urgency.  Musculoskeletal:  Negative for myalgias.  Skin:  Negative for rash.  Neurological:  Negative for dizziness and headaches.  Hematological:  Does not bruise/bleed easily.  Psychiatric/Behavioral:  Negative for suicidal ideas. The patient is not nervous/anxious.         Objective:  BP 118/80 (BP Location: Right Arm, Patient Position: Sitting, Cuff Size: Normal)   Pulse 76   Temp (!) 97.3 F (36.3 C) (Temporal)   Ht '5\' 4"'$  (1.626 m)   Wt 259 lb 6.4 oz (117.7 kg)   SpO2 97%   BMI 44.53 kg/m     Physical Exam Vitals and nursing note reviewed.  Constitutional:      General: She is not in acute distress.    Appearance: She is well-developed. She is obese.  HENT:     Right Ear: Tympanic membrane, ear canal and external ear normal.  Left Ear: Tympanic membrane, ear canal and external ear normal.     Nose: Nose normal.     Mouth/Throat:     Pharynx: No oropharyngeal exudate.  Eyes:     Extraocular Movements: Extraocular movements intact.     Conjunctiva/sclera: Conjunctivae normal.     Pupils: Pupils are equal, round, and reactive to light.  Cardiovascular:     Rate and Rhythm: Normal rate and regular rhythm.     Pulses: Normal pulses.     Heart sounds: Normal heart sounds.  Pulmonary:     Effort: Pulmonary effort is normal. No respiratory distress.     Breath sounds: Normal breath sounds.  Chest:     Chest wall: No tenderness.  Breasts:    Breasts are symmetrical.     Right: Normal.     Left: Normal.  Abdominal:     General: Bowel sounds are normal.     Palpations: Abdomen is soft.  Genitourinary:    Comments: Deferred to GYN Musculoskeletal:        General: Normal range of motion.     Cervical back: Normal range of motion and neck supple.     Right lower leg: No edema.     Left lower leg: No edema.  Lymphadenopathy:     Cervical: No cervical adenopathy.     Upper Body:     Right upper body: No supraclavicular or axillary adenopathy.     Left upper body: No supraclavicular, axillary or pectoral adenopathy.  Skin:    General: Skin is warm and dry.  Neurological:     Mental Status: She is alert and oriented to person, place, and time.     Deep Tendon Reflexes: Reflexes are normal and symmetric.  Psychiatric:        Mood and  Affect: Mood normal.        Behavior: Behavior normal.        Thought Content: Thought content normal.     No results found for any visits on 01/06/23.    Assessment & Plan:    Routine Health Maintenance and Physical Exam  Immunization History  Administered Date(s) Administered   PFIZER(Purple Top)SARS-COV-2 Vaccination 12/21/2020, 01/12/2021   Td 08/09/2014   Tdap 11/10/2021    Health Maintenance  Topic Date Due   Hepatitis C Screening  Never done   COVID-19 Vaccine (3 - 2023-24 season) 01/22/2023 (Originally 08/13/2022)   INFLUENZA VACCINE  03/13/2023 (Originally 07/13/2022)   PAP SMEAR-Modifier  02/28/2024   DTaP/Tdap/Td (3 - Td or Tdap) 11/11/2031   HIV Screening  Completed   HPV VACCINES  Aged Out    Discussed health benefits of physical activity, and encouraged her to engage in regular exercise appropriate for her age and condition.  Problem List Items Addressed This Visit       Other   Iron deficiency anemia   Relevant Orders   CBC with Differential/Platelet   Iron, TIBC and Ferritin Panel   Morbid obesity ([place])   Vitamin D deficiency   Relevant Orders   VITAMIN D 25 Hydroxy (Vit-D Deficiency, Fractures)   Other Visit Diagnoses     Encounter for preventative adult health care exam with abnormal findings    -  Primary   Relevant Orders   MM 3D SCREEN BREAST BILATERAL   Comprehensive metabolic panel   TSH   Lipid panel   Encounter for lipid screening for cardiovascular disease       Relevant Orders   Lipid panel  Breast cancer screening by mammogram       Relevant Orders   MM 3D SCREEN BREAST BILATERAL   Encounter for hepatitis C screening test for low risk patient       Relevant Orders   Hepatitis C Antibody      Return in about 1 year (around 01/07/2024) for CPE (fasting).     Wilfred Lacy, NP

## 2023-01-07 LAB — COMPREHENSIVE METABOLIC PANEL
ALT: 17 U/L (ref 0–35)
AST: 21 U/L (ref 0–37)
Albumin: 4.3 g/dL (ref 3.5–5.2)
Alkaline Phosphatase: 69 U/L (ref 39–117)
BUN: 16 mg/dL (ref 6–23)
CO2: 25 mEq/L (ref 19–32)
Calcium: 9.1 mg/dL (ref 8.4–10.5)
Chloride: 106 mEq/L (ref 96–112)
Creatinine, Ser: 0.84 mg/dL (ref 0.40–1.20)
GFR: 84.67 mL/min (ref >60.00)
Glucose, Bld: 103 mg/dL — ABNORMAL HIGH (ref 70–99)
Potassium: 3.9 mEq/L (ref 3.5–5.1)
Sodium: 138 mEq/L (ref 135–145)
Total Bilirubin: 0.9 mg/dL (ref 0.2–1.2)
Total Protein: 7.1 g/dL (ref 6.0–8.3)

## 2023-01-07 LAB — CBC WITH DIFFERENTIAL/PLATELET
Basophils Absolute: 0 10*3/uL (ref 0.0–0.1)
Basophils Relative: 0.5 % (ref 0.0–3.0)
Eosinophils Absolute: 0.1 10*3/uL (ref 0.0–0.7)
Eosinophils Relative: 0.8 % (ref 0.0–5.0)
HCT: 39.7 % (ref 36.0–46.0)
Hemoglobin: 13 g/dL (ref 12.0–15.0)
Lymphocytes Relative: 43.1 % (ref 12.0–46.0)
Lymphs Abs: 3.2 10*3/uL (ref 0.7–4.0)
MCHC: 32.7 g/dL (ref 30.0–36.0)
MCV: 94.8 fl (ref 78.0–100.0)
Monocytes Absolute: 0.4 10*3/uL (ref 0.1–1.0)
Monocytes Relative: 5.8 % (ref 3.0–12.0)
Neutro Abs: 3.7 10*3/uL (ref 1.4–7.7)
Neutrophils Relative %: 49.8 % (ref 43.0–77.0)
Platelets: 213 10*3/uL (ref 150.0–400.0)
RBC: 4.18 Mil/uL (ref 3.87–5.11)
RDW: 13 % (ref 11.5–15.5)
WBC: 7.3 10*3/uL (ref 4.0–10.5)

## 2023-01-07 LAB — LIPID PANEL
Cholesterol: 191 mg/dL (ref 0–200)
HDL: 53.9 mg/dL (ref >39.00)
LDL Cholesterol: 110 mg/dL — ABNORMAL HIGH (ref 0–99)
NonHDL: 137.33
Total CHOL/HDL Ratio: 4
Triglycerides: 135 mg/dL (ref 0.0–149.0)
VLDL: 27 mg/dL (ref 0.0–40.0)

## 2023-01-07 LAB — IRON,TIBC AND FERRITIN PANEL
%SAT: 32 % (calc) (ref 16–45)
Ferritin: 110 ng/mL (ref 16–232)
Iron: 95 ug/dL (ref 40–190)
TIBC: 300 mcg/dL (calc) (ref 250–450)

## 2023-01-07 LAB — VITAMIN D 25 HYDROXY (VIT D DEFICIENCY, FRACTURES): VITD: 23.4 ng/mL — ABNORMAL LOW (ref 30.00–100.00)

## 2023-01-07 LAB — TSH: TSH: 2.43 u[IU]/mL (ref 0.35–5.50)

## 2023-01-07 LAB — HEPATITIS C ANTIBODY: Hepatitis C Ab: NONREACTIVE

## 2023-03-07 ENCOUNTER — Ambulatory Visit
Admission: RE | Admit: 2023-03-07 | Discharge: 2023-03-07 | Disposition: A | Discharge status: Home / Self Care | Payer: 59 | Source: Ambulatory Visit | Attending: Nurse Practitioner | Admitting: Nurse Practitioner

## 2023-04-22 ENCOUNTER — Ambulatory Visit: Payer: 59 | Admitting: Nurse Practitioner

## 2023-04-25 ENCOUNTER — Ambulatory Visit: Payer: 59 | Admitting: Nurse Practitioner

## 2023-04-25 VITALS — BP 120/87 | HR 65 | Temp 97.9°F | Resp 16 | Ht 64.0 in | Wt 255.0 lb

## 2023-04-25 DIAGNOSIS — H16203 Unspecified keratoconjunctivitis, bilateral: Secondary | ICD-10-CM | POA: Diagnosis not present

## 2023-04-25 MED ORDER — OLOPATADINE HCL 0.1 % OP SOLN: 1.0000 [drp] | Freq: Every day | OPHTHALMIC | 0 refills | Status: AC

## 2023-04-25 MED ORDER — SYSTANE 0.4-0.3 % OP GEL: 1.0000 | OPHTHALMIC | 0 refills | Status: DC | PRN

## 2023-04-25 NOTE — Patient Instructions (Signed)
Dry Eye  Dry eye, also called keratoconjunctivitis sicca, is a condition caused by dryness of the membranes surrounding the eye. It happens when there are not enough healthy, natural tears in the eyes. The eyes must remain moist at all times for good comfort and vision. A small amount of tears is constantly produced by the tear glands (lacrimal glands). These glands are mainly located under the outside part of the upper eyelids. The eyelids produce oils that coat the tears to keep them from evaporating quickly. If the eyelids are inflamed (blepharitis), the lack of healthy oils can make the dry eye worse. Dry eye can happen on its own or be a symptom of several conditions, such as rheumatoid arthritis, lupus, or Sjgren's syndrome. Dry eye may be mild to severe. What are the causes? This condition may be caused by: Not making enough tears (aqueous tear-deficient dry eyes). Tears evaporating from the eyes too quickly (evaporative dry eyes). This is when there is an abnormality in the quality of your tears, especially the oils. This abnormality causes your tears to evaporate so quickly that the eyes cannot be kept moist. What increases the risk? You are more likely to develop this condition if you: Are a woman, especially if you have gone through menopause. Are older. Live in a dry climate. Live or work in a dusty or smoky area. Take certain medicines, such as: Anti-allergy medicines (antihistamines). Blood pressure medicines (antihypertensives), especially "water pills" (diuretics). Birth control pills (oral contraceptives). Laxatives. Tranquilizers. Have eyelid inflammation (blepharitis). Have a history of refractive eye surgery, such as LASIK. Have a history of long-term contact lens use. What are the signs or symptoms? Symptoms of this condition include: Irritation. You may feel: Itchiness. Burning. A feeling as though something is stuck in the eye. Redness. Inflammation of the  eyelids. Light sensitivity. Increased sensitivity and discomfort when wearing contact lenses. Vision that varies throughout the day. Occasional excessive tearing. How is this diagnosed? This condition is diagnosed based on your symptoms, your medical history, and an eye exam. Your health care provider may look at your eye using a microscope and may put dyes in your eye to check the health of the surface of your eye. You may have tests, such as a test to evaluate your tear production (Schirmer test). During this test: A small strip of special paper is gently pressed partly under your lower eyelid. Your tear production is measured by how much of the paper is moistened by your tears during a set amount of time. You may be referred to a health care provider who specializes in medical and surgical eye care (ophthalmologist). How is this treated? Treatment for this condition depends on the type and severity of the dry eyes. To help relieve your symptoms, your health care provider may recommend over-the-counter artificial tears. Artificial tears either come in bottles that have mild preservatives or in small vials or bottles without preservatives. Patients with mild dry eye may do well with tears that have preservatives, while those with more severe dry eye should just use tears without preservatives. Note that one vial may be used several times a day, but should be discarded at the end of the day. If your condition is severe, treatment may also include: Prescription eye drops. Over-the-counter or prescription gels or ointments to moisten your eyes. A prescription nasal spray that increases tear production. Minor surgery to place plugs into the tear drainage ducts. This keep tears from exiting the eye so that tears can stay   on the surface of the eye longer. Medicines to reduce inflammation of the eyelids. Taking an omega-3 fatty acid nutritional supplement. Other treatments include making tears from  your own blood (autologous serum tears), wearing special contact lenses, and even having minor surgery to partially close the outer parts of your eyelids to decrease evaporation. Follow these instructions at home: Take or apply over-the-counter and prescription medicines only as told by your health care provider. This includes eye drops. If directed, apply a warm compress to your eyes to help reduce eyelid inflammation. Place a towel over your eyes and gently press the warm compress over your eyes for about 5 minutes, or as long as told by your health care provider. Drink plenty of fluids to stay well hydrated. If possible, avoid dry, drafty environments. Wear sunglasses when outdoors to protect your eyes from the sun and wind. Use a humidifier at home to increase moisture in the air. Remember to blink often when reading or using the computer for long periods. If you wear contact lenses, remove them regularly to give your eyes a break. Always remove your contact lenses before sleeping. Have a yearly eye exam and vision test. Keep all follow-up visits. This is important. Contact a health care provider if: You have eye pain. You have pus-like fluid coming from your eye. Your symptoms get worse or do not improve with treatment. Get help right away if: Your vision suddenly changes. Summary Dry eye is dryness of the membranes surrounding the eye. Dry eye can happen on its own or be a symptom of several conditions, such as rheumatoid arthritis, lupus, or Sjgren's syndrome. This condition is diagnosed based on your symptoms, your medical history, and an eye exam. Treatment for this condition depends on the type and severity of the dry eye. To help relieve your symptoms, your health care provider may recommend over-the-counter artificial tears. This information is not intended to replace advice given to you by your health care provider. Make sure you discuss any questions you have with your health  care provider. Document Revised: 04/21/2021 Document Reviewed: 04/21/2021 Elsevier Patient Education  2023 [employer]  

## 2023-04-25 NOTE — Progress Notes (Signed)
Acute Office Visit  Subjective:    Patient ID: Kathy Werner, female    DOB: 16-Jan-1976, 47 y.o.   MRN: [id number]  Chief Complaint  Patient presents with   Eye Problem    Pt states" She woke up Wednesday and Thursday with eye redness and eyes closed.  The redness will go away during the day.  Denies any drainage, pain  or eyes crusted over    Eye Problem  Both eyes are affected. This is a recurrent problem. The current episode started 1 to 4 weeks ago. The problem occurs intermittently. The problem has been waxing and waning. There was no injury mechanism. The pain is at a severity of 0/10. The patient is experiencing no pain. There is No known exposure to pink eye. She Does not wear contacts. Associated symptoms include blurred vision, an eye discharge, eye redness and a foreign body sensation. Pertinent negatives include no double vision, fever, itching, nausea, photophobia, recent URI or vomiting. She has tried nothing for the symptoms.   Outpatient Medications Prior to Visit  Medication Sig   aspirin-acetaminophen-caffeine (EXCEDRIN EXTRA STRENGTH) 250-250-65 MG tablet Take 1-2 tablets by mouth every 8 (eight) hours as needed for headache.   cetirizine (ZYRTEC) 10 MG tablet Take 1 tablet (10 mg total) by mouth at bedtime.   fluticasone (FLONASE) 50 MCG/ACT nasal spray Place 2 sprays into both nostrils daily.   methocarbamol (ROBAXIN-750) 750 MG tablet Take 1 tablet (750 mg total) by mouth every 8 (eight) hours as needed for muscle spasms.   No facility-administered medications prior to visit.    Reviewed past medical and social history.  Review of Systems  Constitutional:  Negative for fever.  Eyes:  Positive for blurred vision, discharge and redness. Negative for double vision, photophobia and itching.  Gastrointestinal:  Negative for nausea and vomiting.   Per HPI     Objective:    Physical Exam Vitals and nursing note reviewed.  Eyes:     General: Lids are normal. No  allergic shiner, visual field deficit or scleral icterus.       Right eye: No foreign body, discharge or hordeolum.        Left eye: No foreign body, discharge or hordeolum.     Extraocular Movements:     Right eye: Normal extraocular motion and no nystagmus.     Left eye: Normal extraocular motion and no nystagmus.     Conjunctiva/sclera:     Right eye: Right conjunctiva is not injected. Chemosis present. No exudate or hemorrhage.    Left eye: Left conjunctiva is not injected. Chemosis present. No exudate or hemorrhage.    Pupils: Pupils are equal, round, and reactive to light.  Neurological:     Mental Status: She is alert.     BP 120/87 (BP Location: Right Arm, Patient Position: Sitting, Cuff Size: Large)   Pulse 65   Temp 97.9 F (36.6 C) (Temporal)   Resp 16   Ht 5\' 4"  (1.626 m)   Wt 255 lb (115.7 kg)   LMP 04/22/2023   SpO2 99%   BMI 43.77 kg/m    No results found for any visits on 04/25/23.     Assessment & Plan:   Problem List Items Addressed This Visit   None Visit Diagnoses     Keratoconjunctivitis of both eyes    -  Primary   Relevant Medications   olopatadine (PATANOL) 0.1 % ophthalmic solution   Polyethyl Glycol-Propyl Glycol (SYSTANE) 0.4-0.3 % GEL  ophthalmic gel        Meds ordered this encounter  Medications   olopatadine (PATANOL) 0.1 % ophthalmic solution    Sig: Place 1 drop into both eyes daily at 12 noon.    Dispense:  5 mL    Refill:  0    Order Specific Question:   Supervising Provider    Answer:   Nadene Rubins ALFRED [[id number]]   Polyethyl Glycol-Propyl Glycol (SYSTANE) 0.4-0.3 % GEL ophthalmic gel    Sig: Place 1 Application into both eyes as needed.    Refill:  0    Order Specific Question:   Supervising Provider    Answer:   Mliss Sax [[id number]]   Return if symptoms worsen or fail to improve.    Alysia Penna, NP

## 2023-06-27 ENCOUNTER — Encounter (HOSPITAL_COMMUNITY): Payer: Self-pay

## 2023-06-27 ENCOUNTER — Ambulatory Visit (HOSPITAL_COMMUNITY)
Admission: EM | Admit: 2023-06-27 | Discharge: 2023-06-27 | Disposition: A | Discharge status: Home / Self Care | Payer: 59 | Attending: Internal Medicine | Admitting: Internal Medicine

## 2023-06-27 DIAGNOSIS — S61211A Laceration without foreign body of left index finger without damage to nail, initial encounter: Secondary | ICD-10-CM | POA: Diagnosis not present

## 2023-06-27 MED ORDER — TETANUS-DIPHTH-ACELL PERTUSSIS 5-2.5-18.5 LF-MCG/0.5 IM SUSY: 0.5000 mL | PREFILLED_SYRINGE | Freq: Once | INTRAMUSCULAR | Status: DC

## 2023-06-27 MED ORDER — LIDOCAINE HCL (PF) 2 % IJ SOLN
INTRAMUSCULAR | Status: AC
  Filled 2023-06-27: qty 5

## 2023-06-27 MED ORDER — TETANUS-DIPHTH-ACELL PERTUSSIS 5-2.5-18.5 LF-MCG/0.5 IM SUSY
PREFILLED_SYRINGE | INTRAMUSCULAR | Status: AC
  Filled 2023-06-27: qty 0.5

## 2023-06-27 NOTE — ED Triage Notes (Signed)
Pt states cut her lt 2 fingers on a can when trying open it.

## 2023-06-27 NOTE — Discharge Instructions (Signed)

## 2023-06-27 NOTE — ED Provider Notes (Signed)
[facility]    CSN: [id number] Arrival date & time: 06/27/23  1849      History   Chief Complaint Chief Complaint  Patient presents with   Laceration    HPI Kathy Werner is a 47 y.o. female.   Patient presents to urgent care for evaluation of laceration to the pad of the distal left pointer finger and small abrasion to the pad of the left middle finger. Bleeding controlled to laceration with pressure. Does not take blood thinners. No numbness/tingling distally to injuries. Full ROM to the affected digits. Last tetanus injection was in 2022.      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   Gastroesophageal reflux disease with esophagitis without hemorrhage 11/10/2021   Vitamin D deficiency 10/04/2019   Endometritis 02/08/2019   Anemia 01/09/2018   Dysmenorrhea 01/09/2018   Hypersensitivity reaction 09/08/2016   Menorrhagia 02/16/2015   Iron deficiency anemia 08/23/2014   Uterine leiomyoma 07/02/2013   Migraine    Heel pain 10/03/2011   Morbid obesity (HCC) 02/09/2007   TENSION HEADACHE 02/09/2007    Past Surgical History:  Procedure Laterality Date   DILATATION & CURETTAGE/HYSTEROSCOPY WITH MYOSURE N/A 02/17/2015   Procedure: DILATATION & CURETTAGE/HYSTEROSCOPY WITH MYOSURE;  Surgeon: Hal Morales, MD;  Location: [facility];  Service: Gynecology;  Laterality: N/A;   DILATATION & CURETTAGE/HYSTEROSCOPY WITH MYOSURE N/A 01/11/2017   Procedure: DILATATION & CURETTAGE/HYSTEROSCOPY WITH MYOSURE, 2.9 RESECTION OF  FIBROID;  Surgeon: Hal Morales, MD;  Location: [facility];  Service: Gynecology;  Laterality: N/A;   DILITATION & CURRETTAGE/HYSTROSCOPY WITH  HYDROTHERMAL ABLATION N/A 01/11/2017   Procedure: DILATATION & CURETTAGE/HYSTEROSCOPY WITH HYDROTHERMAL ABLATION;  Surgeon: Hal Morales, MD;  Location: [facility];  Service: Gynecology;  Laterality: N/A;  Dr. Pennie Rushing is asking that the HTA be completed before the resection  PAUL WILL BE HERE FOR THIS CASE STACY HTA rep will be here    IR ANGIOGRAM PELVIS SELECTIVE OR SUPRASELECTIVE  01/03/2018   IR ANGIOGRAM PELVIS SELECTIVE OR SUPRASELECTIVE  01/03/2018   IR ANGIOGRAM SELECTIVE EACH ADDITIONAL VESSEL  01/03/2018   IR ANGIOGRAM SELECTIVE EACH ADDITIONAL VESSEL  01/03/2018   IR EMBO TUMOR ORGAN ISCHEMIA INFARCT INC GUIDE ROADMAPPING  01/03/2018   IR RADIOLOGIST EVAL & MGMT  12/15/2017   IR RADIOLOGIST EVAL & MGMT  02/15/2018   IR RADIOLOGIST EVAL & MGMT  07/19/2018   IR US GUIDE VASC ACCESS RIGHT  01/03/2018   IUD REMOVAL  06/2013   due to worsening dysmenorrhea   MIRENA     Inserted 01-24-13   WISDOM TOOTH EXTRACTION      OB History     Gravida  3   Para      Term      Preterm      AB  1   Living  2      SAB  1   IAB      Ectopic      Multiple  1   Live Births               Home Medications  Prior to Admission medications   Medication Sig Start Date End Date Taking? Authorizing Provider  aspirin-acetaminophen-caffeine (EXCEDRIN EXTRA STRENGTH) [phone] MG tablet Take 1-2 tablets by mouth every 8 (eight) hours as needed for headache. 11/10/21   Nche, Bonna Gains, NP  cetirizine (ZYRTEC) 10 MG tablet Take 1 tablet (10 mg total) by mouth at bedtime. 11/10/21   Nche, Bonna Gains, NP  fluticasone (FLONASE) 50 MCG/ACT nasal spray Place 2 sprays into both nostrils daily. 11/19/21   Eden Emms, NP  methocarbamol (ROBAXIN-750) 750 MG tablet Take 1 tablet (750 mg total) by mouth every 8 (eight) hours as needed for muscle spasms. 10/28/22   Nche, Bonna Gains, NP  olopatadine (PATANOL) 0.1 % ophthalmic solution Place 1 drop into both eyes daily at 12 noon. 04/25/23    Nche, Bonna Gains, NP  Polyethyl Glycol-Propyl Glycol (SYSTANE) 0.4-0.3 % GEL ophthalmic gel Place 1 Application into both eyes as needed. 04/25/23   Nche, Bonna Gains, NP    Family History Family History  Problem Relation Age of Onset   Hypertension Paternal Grandmother    Sudden death Neg Hx    Hyperlipidemia Neg Hx    Heart attack Neg Hx    Diabetes Neg Hx     Social History Social History   Tobacco Use   Smoking status: Never   Smokeless tobacco: Never  Vaping Use   Vaping status: Never Used  Substance Use Topics   Alcohol use: Yes    Comment: occ   Drug use: No     Allergies   Patient has no known allergies.   Review of Systems Review of Systems Per HPI  Physical Exam Triage Vital Signs ED Triage Vitals  Encounter Vitals Group     BP 06/27/23 1937 121/85     Systolic BP Percentile --      Diastolic BP Percentile --      Pulse Rate 06/27/23 1937 65     Resp 06/27/23 1937 18     Temp 06/27/23 1937 98.5 F (36.9 C)     Temp Source 06/27/23 1937 Oral     SpO2 06/27/23 1937 98 %     Weight --      Height --      Head Circumference --      Peak Flow --      Pain Score 06/27/23 1938 7     Pain Loc --      Pain Education --      Exclude from Growth Chart --    No data found.  Updated Vital Signs BP 121/85 (BP Location: Right Arm)   Pulse 65   Temp 98.5 F (36.9 C) (Oral)   Resp 18   SpO2 98%   Visual Acuity Right Eye Distance:   Left Eye Distance:   Bilateral Distance:    Right Eye Near:   Left Eye Near:    Bilateral Near:     Physical Exam Vitals and nursing note reviewed.  Constitutional:      Appearance: She is not ill-appearing or toxic-appearing.  HENT:     Head: Normocephalic and atraumatic.     Right Ear: Hearing and external ear normal.     Left Ear: Hearing and external ear normal.     Nose: Nose normal.     Mouth/Throat:     Lips: Pink.  Eyes:     General: Lids are normal. Vision grossly intact. Gaze aligned  appropriately.     Extraocular Movements: Extraocular movements  intact.     Conjunctiva/sclera: Conjunctivae normal.  Pulmonary:     Effort: Pulmonary effort is normal.  Musculoskeletal:     Right hand: Normal.     Left hand: Laceration (laceration to the pad of the distal left pointer finger as seen in images below) and tenderness (secondary to laceration) present. No swelling, deformity or bony tenderness. Normal range of motion. Normal strength. Normal sensation. There is no disruption of two-point discrimination. Normal capillary refill (less than 2 to affected digit). Normal pulse (strong left radial pulse).     Cervical back: Neck supple.  Skin:    General: Skin is warm and dry.     Capillary Refill: Capillary refill takes less than 2 seconds.     Findings: Laceration (1cm laceration to the pad of the distal left pointer finger) present. No rash.  Neurological:     General: No focal deficit present.     Mental Status: She is alert and oriented to person, place, and time. Mental status is at baseline.     Cranial Nerves: No dysarthria or facial asymmetry.  Psychiatric:        Mood and Affect: Mood normal.        Speech: Speech normal.        Behavior: Behavior normal.        Thought Content: Thought content normal.        Judgment: Judgment normal.           UC Treatments / Results  Labs (all labs ordered are listed, but only abnormal results are displayed) Labs Reviewed - No data to display  EKG   Radiology No results found.  Procedures Laceration Repair  Date/Time: 06/27/2023 8:29 PM  Performed by: Carlisle Beers, FNP Authorized by: Carlisle Beers, FNP   Consent:    Consent obtained:  Verbal   Consent given by:  Patient   Risks, benefits, and alternatives were discussed: yes     Risks discussed:  Infection, nerve damage, need for additional repair, pain, poor cosmetic result, poor wound healing, tendon damage, retained foreign body and  vascular damage   Alternatives discussed:  No treatment Universal protocol:    Procedure explained and questions answered to patient or proxy's satisfaction: yes     Patient identity confirmed:  Verbally with patient Anesthesia:    Anesthesia method:  Local infiltration   Local anesthetic:  Lidocaine 1% w/o epi Laceration details:    Location:  Finger   Finger location:  L index finger   Length (cm):  1   Depth (mm):  5 Exploration:    Wound exploration: entire depth of wound visualized     Wound extent: no foreign bodies/material noted and no tendon damage noted   Treatment:    Area cleansed with:  Povidone-iodine, soap and water and Shur-Clens   Amount of cleaning:  Standard   Debridement:  None Skin repair:    Repair method:  Sutures   Suture size:  4-0   Suture material:  Nylon   Suture technique:  Simple interrupted   Number of sutures:  4 Approximation:    Approximation:  Close Repair type:    Repair type:  Simple Post-procedure details:    Dressing:  Non-adherent dressing   Procedure completion:  Tolerated well, no immediate complications  (including critical care time)  Medications Ordered in UC Medications - No data to display  Initial Impression / Assessment and Plan / UC Course  I have reviewed the triage vital  signs and the nursing notes.  Pertinent labs & imaging results that were available during my care of the patient were reviewed by me and considered in my medical decision making (see chart for details).   1. Laceration of left index finger without foreign body without damage to nail Laceration repaired, see procedure note above for details. Discussed wound care and cleaning at home.  Imaging: deferred based on stable MSK findings. Infection return precautions discussed.  Suture removal in 10 days.  Tdap up to date.  Tylenol as needed for pain at home.  Advised to rest and avoid activities that may increase tension to wound/sutures or expose wound to  infection. Excuse note given.   Counseled patient on potential for adverse effects with medications prescribed/recommended today, strict ER and return-to-clinic precautions discussed, patient verbalized understanding.    Final Clinical Impressions(s) / UC Diagnoses   Final diagnoses:  Laceration of left index finger without foreign body without damage to nail, initial encounter     Discharge Instructions      Wound care: Please keep the area surrounding the wound/sutures clean and dry for the next 24 hours. After 24 hours, you may get the wound wet. Gently clean wound with antibacterial soap. Do not scrub wound. Cover the area with a nonstick bandage and change the bandage 2 times a day.   You should have the sutures removed in 10 days by your primary care provider or at urgent care. Return sooner than 10 days if you experience discharge from your laceration, redness around your laceration, warmth around your laceration, or fever.   You may take over the counter medicines as needed for aches and pains once the numbing wears off.   Thanks for letting me fix your cut today!     ED Prescriptions   None    PDMP not reviewed this encounter.   Carlisle Beers, [place] 06/28/23 2131

## 2023-07-07 ENCOUNTER — Ambulatory Visit: Payer: 59 | Admitting: Nurse Practitioner

## 2023-07-07 ENCOUNTER — Encounter: Payer: Self-pay | Admitting: Nurse Practitioner

## 2023-07-07 VITALS — BP 126/75 | HR 73 | Temp 98.0°F | Ht 64.0 in | Wt 259.0 lb

## 2023-07-07 DIAGNOSIS — L03012 Cellulitis of left finger: Secondary | ICD-10-CM

## 2023-07-07 DIAGNOSIS — S61211D Laceration without foreign body of left index finger without damage to nail, subsequent encounter: Secondary | ICD-10-CM

## 2023-07-07 MED ORDER — MUPIROCIN 2 % EX OINT: 1.0000 | TOPICAL_OINTMENT | Freq: Two times a day (BID) | CUTANEOUS | 0 refills | Status: DC

## 2023-07-07 MED ORDER — CEPHALEXIN 500 MG PO CAPS: 500.0000 mg | ORAL_CAPSULE | Freq: Four times a day (QID) | ORAL | 0 refills | Status: DC

## 2023-07-07 NOTE — Patient Instructions (Signed)
Wound Infection A wound infection happens when germs start to grow in a wound. Infection can cause the wound to break open or get worse. Wound infections need treatment. If a wound infection is not treated, serious problems can happen. These problems could include getting an infection in your blood (septicemia) or bones (osteomyelitis). What are the causes? A wound infection is most often caused by bacteria growing in a wound. Other germs, like yeast and fungi, can also cause wound infections. What increases the risk? The following factors may make you more likely to develop a wound infection: A weak body defense system (immune system). Diabetes. Taking steroid medicines for a long time (chronic use). Smoking. Being an older adult. Obesity. Taking chemotherapy medicines. Poor nutrition. What are the signs or symptoms? Symptoms of a wound infection include: More redness, swelling, or pain at the wound site. More blood or fluid at the wound site. A bad smell coming from a wound or bandage (dressing). Fever. Feeling tired (fatigued). Warmth at or around the wound. Pus at the wound site. How is this diagnosed? A wound infection is diagnosed based on your symptoms, medical history, and physical exam. You may also have a wound culture, blood tests, or both. How is this treated? This condition is usually treated with antibiotics and medicines that lower inflammation. The infection should get better in 24-48 hours after starting antibiotics. After 24-48 hours, redness around the wound should stop spreading. The wound should also be less painful. If the condition is severe, you may need to stay at the hospital and get antibiotics through an IV. Follow these instructions at home: Medicines Take or apply over-the-counter and prescription medicines only as told by your health care provider. If you were prescribed antibiotics, take or apply them as told by your provider. Do not stop using the  antibiotic even if you start to feel better. Wound care  Clean the wound each day or as told by your provider. Wash the wound with mild soap and water. Rinse the wound with water to remove all soap. Pat the wound dry with a clean towel. Do not rub it. Follow instructions from your provider about how to take care of your wound. Make sure you: Wash your hands with soap and water for at least 20 seconds before and after you change your dressing. If soap and water are not available, use hand sanitizer. Change your dressing as told by your provider. Leave stitches (sutures), skin glue, or tape strips in place. These skin closures may need to stay in place for 2 weeks or longer. If tape strip edges start to loosen and curl up, you may trim the loose edges. Do not remove tape strips completely unless your provider tells you to do that. Check your wound every day for signs of infection. Check for: More redness, swelling, or pain. More fluid or blood. Warmth. Pus or a bad smell. General instructions Drink enough fluid to keep your pee (urine) pale yellow. Do not take baths, swim, or use a hot tub until your provider approves. Ask your provider if you may take showers. You may only be allowed to take sponge baths. Raise (elevate) the wound area above the level of your heart while you are sitting or lying down. Do not scratch or pick at the wound. Keep all follow-up visits. These visits help your provider make sure a more serious infection is not developing. Contact a health care provider if: Your infection does not get better in 24-48 hours.  You have signs of infection. You have a fever. Your wound gets larger, turns dark in color, or becomes more painful. You feel generally sick (malaise) with muscle aches and weakness. Your symptoms get worse. You have vomiting or diarrhea that does not stop. Get help right away if: Your wound that was closed breaks open. You see red streaks coming from the  infected area. This information is not intended to replace advice given to you by your health care provider. Make sure you discuss any questions you have with your health care provider. Document Revised: 09/02/2022 Document Reviewed: 09/02/2022 Elsevier Patient Education  2024 [employer].

## 2023-07-07 NOTE — Progress Notes (Signed)
Acute Office Visit  Subjective:    Patient ID: Kathy Werner, female    DOB: December 18, 1975, 47 y.o.   MRN: [id number]  Chief Complaint  Patient presents with   Suture / Staple Removal    Put in at Urgent care across from [facility] 06/27/23 Noticed Yellow pus and bleeding this morning. Finger is very painful    She cut index finger on can of green beans while cooking. Sutures placed by ED provider on 06/27/2023 Suture / Staple Removal The sutures were placed 7 to 10 days ago. She tried nothing since the wound repair. The treatment provided no relief. Her temperature was unmeasured prior to arrival. There has been colored discharge from the wound. The redness has not changed. The swelling has worsened. The pain has worsened. There is difficulty moving the extremity or digit due to pain.  UTD with TDAP.  Outpatient Medications Prior to Visit  Medication Sig   aspirin-acetaminophen-caffeine (EXCEDRIN EXTRA STRENGTH) 250-250-65 MG tablet Take 1-2 tablets by mouth every 8 (eight) hours as needed for headache.   cetirizine (ZYRTEC) 10 MG tablet Take 1 tablet (10 mg total) by mouth at bedtime.   fluticasone (FLONASE) 50 MCG/ACT nasal spray Place 2 sprays into both nostrils daily.   methocarbamol (ROBAXIN-750) 750 MG tablet Take 1 tablet (750 mg total) by mouth every 8 (eight) hours as needed for muscle spasms.   olopatadine (PATANOL) 0.1 % ophthalmic solution Place 1 drop into both eyes daily at 12 noon.   Polyethyl Glycol-Propyl Glycol (SYSTANE) 0.4-0.3 % GEL ophthalmic gel Place 1 Application into both eyes as needed.   No facility-administered medications prior to visit.   Reviewed past medical and social history.  Review of Systems Per HPI     Objective:    Physical Exam Vitals and nursing note reviewed.  Musculoskeletal:     Left hand: Swelling, laceration and tenderness present. No deformity. Normal range of motion.       Hands:     Comments: Finger nail intact. Proximal Index finger  swelling Wound edges still open with serosanguineous drainage. 4 sutures removed. Cleaned wound, applied topical antibiotic and covered with guaze dressing. Provided wound care instruction  Skin:    Findings: Erythema present.    BP 126/75 (BP Location: Left Arm, Patient Position: Sitting, Cuff Size: Large)   Pulse 73   Temp 98 F (36.7 C)   Ht 5\' 4"  (1.626 m)   Wt 259 lb (117.5 kg)   SpO2 100%   BMI 44.46 kg/m    No results found for any visits on 07/07/23.     Assessment & Plan:   Problem List Items Addressed This Visit   None Visit Diagnoses     Laceration of left index finger without foreign body without damage to nail, subsequent encounter    -  Primary   Relevant Medications   mupirocin ointment (BACTROBAN) 2 %   cephALEXin (KEFLEX) 500 MG capsule   Cellulitis of finger of left hand       Relevant Medications   mupirocin ointment (BACTROBAN) 2 %   cephALEXin (KEFLEX) 500 MG capsule      Meds ordered this encounter  Medications   mupirocin ointment (BACTROBAN) 2 %    Sig: Apply 1 Application topically 2 (two) times daily. Apply with dressing change    Dispense:  15 g    Refill:  0    Order Specific Question:   Supervising Provider    Answer:   Nadene Rubins  ALFRED [[id number]]   cephALEXin (KEFLEX) 500 MG capsule    Sig: Take 1 capsule (500 mg total) by mouth 4 (four) times daily.    Dispense:  20 capsule    Refill:  0    Order Specific Question:   Supervising Provider    Answer:   Mliss Sax [[id number]]   Return in about 1 week (around 07/14/2023) for f/up on finger laceration.    Alysia Penna, NP

## 2023-07-08 ENCOUNTER — Ambulatory Visit: Payer: 59 | Admitting: Nurse Practitioner

## 2023-07-14 ENCOUNTER — Ambulatory Visit: Payer: 59 | Admitting: Nurse Practitioner

## 2023-07-14 ENCOUNTER — Encounter: Payer: Self-pay | Admitting: Nurse Practitioner

## 2023-07-14 VITALS — BP 120/80 | HR 61 | Temp 98.0°F | Resp 16 | Ht 64.0 in | Wt 259.0 lb

## 2023-07-14 DIAGNOSIS — S61211D Laceration without foreign body of left index finger without damage to nail, subsequent encounter: Secondary | ICD-10-CM

## 2023-07-14 DIAGNOSIS — L03012 Cellulitis of left finger: Secondary | ICD-10-CM

## 2023-07-14 NOTE — Progress Notes (Signed)
   Acute Office Visit  Subjective:    Patient ID: FANISHA PERSLEY, female    DOB: 08/12/1976, 47 y.o.   MRN: [id number]  Chief Complaint  Patient presents with   Follow-up    Laceration of left index finger -  Pt is doing much better   HPI Patient is in today for re eval of left index finger laceration. She has completed oral abx as prescribed. She reports improved swelling and pain. Denies any fever.  Outpatient Medications Prior to Visit  Medication Sig   aspirin-acetaminophen-caffeine (EXCEDRIN EXTRA STRENGTH) 250-250-65 MG tablet Take 1-2 tablets by mouth every 8 (eight) hours as needed for headache.   cephALEXin (KEFLEX) 500 MG capsule Take 1 capsule (500 mg total) by mouth 4 (four) times daily.   cetirizine (ZYRTEC) 10 MG tablet Take 1 tablet (10 mg total) by mouth at bedtime.   fluticasone (FLONASE) 50 MCG/ACT nasal spray Place 2 sprays into both nostrils daily.   methocarbamol (ROBAXIN-750) 750 MG tablet Take 1 tablet (750 mg total) by mouth every 8 (eight) hours as needed for muscle spasms.   mupirocin ointment (BACTROBAN) 2 % Apply 1 Application topically 2 (two) times daily. Apply with dressing change   norethindrone-ethinyl estradiol-FE (JUNEL FE 1/20) 1-20 MG-MCG tablet Take 1 tablet by mouth daily.   olopatadine (PATANOL) 0.1 % ophthalmic solution Place 1 drop into both eyes daily at 12 noon.   Polyethyl Glycol-Propyl Glycol (SYSTANE) 0.4-0.3 % GEL ophthalmic gel Place 1 Application into both eyes as needed.   No facility-administered medications prior to visit.    Reviewed past medical and social history.  Review of Systems Per HPI     Objective:    Physical Exam Vitals and nursing note reviewed.  Musculoskeletal:     Right hand: Laceration and tenderness present. No deformity. Normal range of motion. Normal strength.       Hands:     Comments: Healing laceration, no drainage, no erythema, no induration or fluctuance  Neurological:     Mental Status: She is  alert.    BP 120/80 (BP Location: Left Arm, Patient Position: Sitting, Cuff Size: Large)   Pulse 61   Temp 98 F (36.7 C)   Resp 16   Ht 5\' 4"  (1.626 m)   Wt 259 lb (117.5 kg)   SpO2 98%   BMI 44.46 kg/m    No results found for any visits on 07/14/23.      Assessment & Plan:   Problem List Items Addressed This Visit   None Visit Diagnoses     Laceration of left index finger without foreign body without damage to nail, subsequent encounter    -  Primary     Continue topical antibiotic ointment until wound heals. Clean with soap and water Leave wound open when at home, cover when doing dishes or cleaning  No orders of the defined types were placed in this encounter.  Return if symptoms worsen or fail to improve.    Alysia Penna, NP

## 2023-07-14 NOTE — Patient Instructions (Signed)
Continue topical antibiotic ointment until wound heals. Clean with soap and water Leave wound open when at home, cover when doing dishes or cleaning

## 2023-08-09 ENCOUNTER — Encounter: Payer: Self-pay | Admitting: Nurse Practitioner

## 2024-02-07 ENCOUNTER — Encounter: Payer: Self-pay | Admitting: Oncology

## 2024-02-07 ENCOUNTER — Ambulatory Visit: Payer: Self-pay | Admitting: Nurse Practitioner

## 2024-02-07 NOTE — Telephone Encounter (Signed)
 Patient was sent to nurse triage and made an appointment for tomorrow, 02/08/24 at 10:20am

## 2024-02-07 NOTE — Telephone Encounter (Signed)
 Copied from CRM [id number]. Topic: Clinical - Red Word Triage >> Feb 07, 2024  8:45 AM Truddie Crumble wrote: Patient/patient representative is calling to schedule an appointment. Refer to attachments for appointment information. Shooting pain in her head on the right side   Chief Complaint: Pain in back of neck and head Frequency: Ongoing since yesterday Pertinent Negatives: Patient denies head injury Disposition: [] ED /[] Urgent Care (no appt availability in office) / [x] Appointment(In office/virtual)/ []  [facility]/ [] Home Care/ [] Refused Recommended Disposition /[]  Mobile Bus/ []  Follow-up with PCP Additional Notes: Patient stated that she has been having pain in her neck and back of her head since yesterday. Advil eases the pain but hasn't fully resolved. Appointment scheduled for tomorrow.   Reason for Disposition  [1] MODERATE headache (e.g., interferes with normal activities) AND [2] present > 24 hours AND [3] unexplained  (Exceptions: analgesics not tried, typical migraine, or headache part of viral illness)  Answer Assessment - Initial Assessment Questions 1. LOCATION: "Where does it hurt?"   Back of neck and back of head      2. ONSET: "When did the headache start?" (Minutes, hours or days)      Yesterday  3. PATTERN: "Does the pain come and go, or has it been constant since it started?"     Comes and goes  4. SEVERITY: "How bad is the pain?" and "What does it keep you from doing?"  (e.g., Scale 1-10; mild, moderate, or severe)   - MILD (1-3): doesn't interfere with normal activities    - MODERATE (4-7): interferes with normal activities or awakens from sleep    - SEVERE (8-10): excruciating pain, unable to do any normal activities        6 or 7 Shooting pain   5. CAUSE: "What do you think is causing the headache?"     Maybe sleeping the wrong  6. MIGRAINE: "Have you been diagnosed with migraine headaches?" If Yes, ask: "Is this headache similar?"      Use  to have migraines years ago  7. HEAD INJURY: "Has there been any recent injury to the head?"      No  8. OTHER SYMPTOMS: "Do you have any other symptoms?" (fever, stiff neck, eye pain, sore throat, cold symptoms)     Neck is stiff but patient is still able to move it, drainage in nose and throat  Protocols used: Headache-A-AH

## 2024-02-08 ENCOUNTER — Encounter: Payer: Self-pay | Admitting: Nurse Practitioner

## 2024-02-08 ENCOUNTER — Ambulatory Visit: Payer: BC Managed Care – PPO | Admitting: Nurse Practitioner

## 2024-02-08 VITALS — BP 128/78 | HR 68 | Temp 98.2°F | Ht 65.0 in | Wt 206.0 lb

## 2024-02-08 DIAGNOSIS — S161XXA Strain of muscle, fascia and tendon at neck level, initial encounter: Secondary | ICD-10-CM

## 2024-02-08 MED ORDER — CYCLOBENZAPRINE HCL 5 MG PO TABS: 5.0000 mg | ORAL_TABLET | Freq: Two times a day (BID) | ORAL | 0 refills | Status: DC

## 2024-02-08 MED ORDER — ACETAMINOPHEN ER 650 MG PO TBCR
650.0000 mg | EXTENDED_RELEASE_TABLET | Freq: Three times a day (TID) | ORAL | Status: AC | PRN
Start: 2024-02-08 — End: ?

## 2024-02-08 MED ORDER — PREDNISONE 20 MG PO TABS
ORAL_TABLET | ORAL | 0 refills | Status: AC
Start: 2024-02-08 — End: 2024-02-13

## 2024-02-08 MED ORDER — TRAMADOL HCL 50 MG PO TABS
50.0000 mg | ORAL_TABLET | Freq: Three times a day (TID) | ORAL | 0 refills | Status: AC | PRN
Start: 2024-02-08 — End: 2024-02-11

## 2024-02-08 MED ORDER — KETOROLAC TROMETHAMINE 30 MG/ML IJ SOLN
30.0000 mg | Freq: Once | INTRAMUSCULAR | Status: AC
Start: 2024-02-08 — End: 2024-02-08
  Administered 2024-02-08: 30 mg via INTRAMUSCULAR

## 2024-02-08 NOTE — Progress Notes (Signed)
 Acute Office Visit  Subjective:    Patient ID: Kathy Werner, female    DOB: [birth year], 48 y.o.   MRN: [id number]  Chief Complaint  Patient presents with   Neck Pain    Shooting pain on right side back of head to ear lobe     Neck Pain  This is a new problem. The current episode started in the past 7 days. The problem occurs constantly. The problem has been unchanged. The pain is associated with a twisting injury. The pain is present in the right side and occipital region. The quality of the pain is described as aching. The pain is Same all the time. Stiffness is present All day. Associated symptoms include headaches. Pertinent negatives include no chest pain, fever, leg pain, numbness, pain with swallowing, paresis, photophobia, syncope, tingling, trouble swallowing, visual change, weakness or weight loss. She has tried NSAIDs for the symptoms. The treatment provided no relief.   Outpatient Medications Prior to Visit  Medication Sig   cetirizine (ZYRTEC) 10 MG tablet Take 1 tablet (10 mg total) by mouth at bedtime.   fluticasone (FLONASE) 50 MCG/ACT nasal spray Place 2 sprays into both nostrils daily.   methocarbamol (ROBAXIN-750) 750 MG tablet Take 1 tablet (750 mg total) by mouth every 8 (eight) hours as needed for muscle spasms.   mupirocin ointment (BACTROBAN) 2 % Apply 1 Application topically 2 (two) times daily. Apply with dressing change   norethindrone-ethinyl estradiol-FE (JUNEL FE 1/20) 1-20 MG-MCG tablet Take 1 tablet by mouth daily.   olopatadine (PATANOL) 0.1 % ophthalmic solution Place 1 drop into both eyes daily at 12 noon.   Polyethyl Glycol-Propyl Glycol (SYSTANE) 0.4-0.3 % GEL ophthalmic gel Place 1 Application into both eyes as needed.   VITAMIN D, CHOLECALCIFEROL, PO Take 1 tablet by mouth daily.   [DISCONTINUED] aspirin-acetaminophen-caffeine (EXCEDRIN EXTRA STRENGTH) 250-250-65 MG tablet Take 1-2 tablets by mouth every 8 (eight) hours as needed for headache.    No facility-administered medications prior to visit.    Reviewed past medical and social history.  Review of Systems  Constitutional:  Negative for fever and weight loss.  HENT:  Negative for trouble swallowing.   Eyes:  Negative for photophobia.  Cardiovascular:  Negative for chest pain and syncope.  Musculoskeletal:  Positive for neck pain.  Neurological:  Positive for headaches. Negative for tingling, weakness and numbness.   Per HPI     Objective:    Physical Exam Vitals and nursing note reviewed.  HENT:     Head:     Jaw: There is normal jaw occlusion.     Right Ear: Tympanic membrane, ear canal and external ear normal.     Left Ear: Tympanic membrane, ear canal and external ear normal.     Mouth/Throat:     Mouth: Mucous membranes are moist.     Tongue: No lesions. Tongue does not deviate from midline.     Palate: No mass and lesions.     Pharynx: Oropharynx is clear. Uvula midline.     Tonsils: No tonsillar exudate.  Neck:     Thyroid: No thyroid mass, thyromegaly or thyroid tenderness.  Cardiovascular:     Rate and Rhythm: Normal rate.     Pulses: Normal pulses.  Pulmonary:     Effort: Pulmonary effort is normal.  Musculoskeletal:     Cervical back: Tenderness present. No torticollis. Pain with movement and muscular tenderness present. No spinous process tenderness. Decreased range of motion.  Lymphadenopathy:  Cervical: No cervical adenopathy.  Skin:    Findings: No rash.  Neurological:     Mental Status: She is alert and oriented to person, place, and time.    BP 128/78 (BP Location: Right Arm, Patient Position: Sitting, Cuff Size: Large)   Pulse 68   Temp 98.2 F (36.8 C) (Temporal)   Ht 5\' 5"  (1.651 m)   Wt 206 lb (93.4 kg)   LMP 01/19/2024   SpO2 98%   BMI 34.28 kg/m    No results found for any visits on 02/08/24.     Assessment & Plan:   Problem List Items Addressed This Visit   None Visit Diagnoses       Posterolateral cervical  muscle strain, initial encounter    -  Primary   Relevant Medications   ketorolac (TORADOL) 30 MG/ML injection 30 mg (Completed)   acetaminophen (TYLENOL 8 HOUR) 650 MG CR tablet   cyclobenzaprine (FLEXERIL) 5 MG tablet   predniSONE (DELTASONE) 20 MG tablet   traMADol (ULTRAM) 50 MG tablet      Meds ordered this encounter  Medications   ketorolac (TORADOL) 30 MG/ML injection 30 mg   acetaminophen (TYLENOL 8 HOUR) 650 MG CR tablet    Sig: Take 1 tablet (650 mg total) by mouth every 8 (eight) hours as needed for pain.    Supervising Provider:   Nadene Rubins ALFRED [[id number]]   cyclobenzaprine (FLEXERIL) 5 MG tablet    Sig: Take 1 tablet (5 mg total) by mouth 2 (two) times daily.    Dispense:  14 tablet    Refill:  0    Supervising Provider:   Nadene Rubins ALFRED [[id number]]   predniSONE (DELTASONE) 20 MG tablet    Sig: Take 3 tablets (60 mg total) by mouth daily with breakfast for 1 day, THEN 2.5 tablets (50 mg total) daily with breakfast for 1 day, THEN 2 tablets (40 mg total) daily with breakfast for 1 day, THEN 1.5 tablets (30 mg total) daily with breakfast for 1 day, THEN 1 tablet (20 mg total) daily with breakfast for 1 day.    Dispense:  10 tablet    Refill:  0    Supervising Provider:   Nadene Rubins ALFRED [[id number]]   traMADol (ULTRAM) 50 MG tablet    Sig: Take 1 tablet (50 mg total) by mouth every 8 (eight) hours as needed for up to 3 days for severe pain (pain score 7-10).    Dispense:  6 tablet    Refill:  0    Supervising Provider:   Nadene Rubins ALFRED [[id number]]   Use cold compress 2-3x/day x 3days, then alternate between warm and cold compress as needed. Flexeril and tramadol may cause drowsiness. Use tramadol for severe pain only. Call office if no improvement in 10days.  Return if symptoms worsen or fail to improve.    Alysia Penna, NP

## 2024-02-08 NOTE — Patient Instructions (Addendum)
 Use cold compress 2-3x/day x 3days, then alternate between warm and cold compress as needed. Flexeril and tramadol may cause drowsiness. Use tramadol for severe pain only. Call office if no improvement in 10days.  Spasticity Spasticity is a condition in which your muscles contract suddenly and unpredictably (spasm). Spasticity usually affects your arms, legs, or back. It can also affect the way you walk. Spasticity can range from mild muscle stiffness and tightness to severe, uncontrollable muscle spasms. Severe spasticity can be painful and can freeze your muscles in an uncomfortable position. Follow these instructions at home: Managing muscle stiffness and spasms     Wear a brace as told by your health care provider to prevent muscle contractions. Have the affected muscles massaged. If directed, apply heat to the affected muscle area. Use the heat source that your health care provider recommends, such as a moist heat pack or heating pad. Place a towel between your skin and the heat source. Leave the heat on for 20-30 minutes. Remove the heat if your skin turns bright red. This is especially important if you are unable to feel pain, heat, or cold. You may have a greater risk of getting burned. If directed, apply ice to the affected muscle area: Put ice in a plastic bag. Place a towel between your skin and the bag or between your brace and the bag. Leave the ice on for 20 minutes, 2?3 times a day. Activity Stay active as directed by your health care provider. Find a safe exercise program that fits your needs and ability. Maintain good posture when walking and sitting. Work with a physical therapist to learn exercises that will stretch and strengthen your muscles. Do stretching and range-of-motion exercises at home as told by a physical therapist. Work with an occupational therapist. This type of health care provider can help you function better at home and at work. If you have severe  spasticity, use mobility aids, such as a walker or cane, as told by your health care provider. General instructions Watch your condition for any changes. Wear loose, comfortable clothing that does not restrict your movement. Wear closed-toe shoes that fit well and support your feet. Wear shoes that have rubber soles or low heels. Take over-the-counter and prescription medicine only as told by your health care provider. Keep all follow-up visits as told by your health care provider. This is important. Contact a health care provider if you: Have worsening muscle spasms. Develop other symptoms along with spasticity. Have a fever or chills. Experience a burning feeling when you pass urine. Become constipated. Need more support at home. Get help right away if you: Have trouble breathing. Have a muscle spasm that freezes you into a painful position. Cannot walk. Cannot care for yourself at home. Have trouble passing urine or have urinary incontinence. Summary Spasticity is a condition in which your muscles contract suddenly and unpredictably (spasm). Spasticity usually affects your arms, legs, or back. Spasticity can range from mild muscle stiffness and tightness to severe, uncontrollable muscle spasms. Do stretching and range-of-motion exercises at home as told by a physical therapist. Take over-the-counter and prescription medicine only as told by your health care provider. This information is not intended to replace advice given to you by your health care provider. Make sure you discuss any questions you have with your health care provider. Document Revised: 11/22/2022 Document Reviewed: 11/22/2022 Elsevier Patient Education  2024 [employer].

## 2024-02-09 ENCOUNTER — Encounter: Payer: Self-pay | Admitting: Nurse Practitioner

## 2024-03-22 ENCOUNTER — Encounter: Payer: Self-pay | Admitting: Internal Medicine

## 2024-03-22 ENCOUNTER — Ambulatory Visit: Admitting: Internal Medicine

## 2024-03-22 VITALS — BP 110/74 | HR 76 | Temp 98.2°F | Ht 65.0 in | Wt 266.4 lb

## 2024-03-22 DIAGNOSIS — J0140 Acute pansinusitis, unspecified: Secondary | ICD-10-CM

## 2024-03-22 MED ORDER — TRIAMCINOLONE ACETONIDE 40 MG/ML IJ SUSP
40.0000 mg | Freq: Once | INTRAMUSCULAR | Status: AC
Start: 2024-03-22 — End: 2024-03-22
  Administered 2024-03-22: 40 mg via INTRAMUSCULAR

## 2024-03-22 MED ORDER — AMOXICILLIN-POT CLAVULANATE 875-125 MG PO TABS: 1.0000 | ORAL_TABLET | Freq: Two times a day (BID) | ORAL | 0 refills | Status: AC

## 2024-03-22 MED ORDER — FLUTICASONE PROPIONATE 50 MCG/ACT NA SUSP
2.0000 | Freq: Every day | NASAL | 1 refills | Status: AC
Start: 2024-03-22 — End: ?

## 2024-03-22 NOTE — Patient Instructions (Addendum)
 Do sinus rinses daily  Continue Zyrtec 10mg  once daily  Tylenol/ibuprofen if needed for pain

## 2024-03-22 NOTE — Progress Notes (Signed)
 [facility] [address] [place] [address] Dept: [phone] Dept Fax: [phone]  Acute Care Office Visit  Subjective:   Kathy Werner Dec 25, 1975 03/22/2024  Chief Complaint  Patient presents with   Sinus Problem    Started on Monday     HPI: Kathy Werner is a 48 yo F who complains of URI symptoms onset 4 days ago. Patient states the initially got better, but then worsened , especially today.   Fever: no Runny nose: yes Nasal congestion: yes  Sinus pressure: yes -frontal and maxillary Sneezing: yes Cough: yes Ear pain: yes - fullness Sore throat: yes - 2 days ago , has resolved  Treatments tried: Zyrtec, Xyzal with no relief.   Recent sick contacts: no      The following portions of the patient's history were reviewed and updated as appropriate: past medical history, past surgical history, family history, social history, allergies, medications, and problem list.   Patient Active Problem List   Diagnosis Date Noted   Low back pain 10/28/2022   PND (post-nasal drip) 11/19/2021   Gastroesophageal reflux disease with esophagitis without hemorrhage 11/10/2021   Vitamin D deficiency 10/04/2019   Endometritis 02/08/2019   Anemia 01/09/2018   Dysmenorrhea 01/09/2018   Hypersensitivity reaction 09/08/2016   Menorrhagia 02/16/2015   Iron deficiency anemia 08/23/2014   Uterine leiomyoma 07/02/2013   Migraine    Morbid obesity (HCC) 02/09/2007   TENSION HEADACHE 02/09/2007   Past Medical History:  Diagnosis Date   Anemia    receiving iron infusion 01/07/2017   Fibroid    Heel pain 10/03/2011   Menorrhagia 03/2014   Migraines    Vaginal Pap smear, abnormal    now nl since [id number]   Past Surgical History:  Procedure Laterality Date   DILATATION & CURETTAGE/HYSTEROSCOPY WITH MYOSURE N/A 02/17/2015   Procedure: DILATATION & CURETTAGE/HYSTEROSCOPY WITH MYOSURE;  Surgeon: Hal Morales, MD;   Location: [facility];  Service: Gynecology;  Laterality: N/A;   DILATATION & CURETTAGE/HYSTEROSCOPY WITH MYOSURE N/A 01/11/2017   Procedure: DILATATION & CURETTAGE/HYSTEROSCOPY WITH MYOSURE, 2.9 RESECTION OF  FIBROID;  Surgeon: Hal Morales, MD;  Location: [facility];  Service: Gynecology;  Laterality: N/A;   DILITATION & CURRETTAGE/HYSTROSCOPY WITH HYDROTHERMAL ABLATION N/A 01/11/2017   Procedure: DILATATION & CURETTAGE/HYSTEROSCOPY WITH HYDROTHERMAL ABLATION;  Surgeon: Hal Morales, MD;  Location: [facility];  Service: Gynecology;  Laterality: N/A;  Dr. Pennie Rushing is asking that the HTA be completed before the resection  PAUL WILL BE HERE FOR THIS CASE STACY HTA rep will be here    IR ANGIOGRAM PELVIS SELECTIVE OR SUPRASELECTIVE  01/03/2018   IR ANGIOGRAM PELVIS SELECTIVE OR SUPRASELECTIVE  01/03/2018   IR ANGIOGRAM SELECTIVE EACH ADDITIONAL VESSEL  01/03/2018   IR ANGIOGRAM SELECTIVE EACH ADDITIONAL VESSEL  01/03/2018   IR EMBO TUMOR ORGAN ISCHEMIA INFARCT INC GUIDE ROADMAPPING  01/03/2018   IR RADIOLOGIST EVAL & MGMT  12/15/2017   IR RADIOLOGIST EVAL & MGMT  02/15/2018   IR RADIOLOGIST EVAL & MGMT  07/19/2018   IR US GUIDE VASC ACCESS RIGHT  01/03/2018   IUD REMOVAL  06/2013   due to worsening dysmenorrhea   MIRENA     Inserted 01-24-13   WISDOM TOOTH EXTRACTION     Family History  Problem Relation Age of Onset   Hypertension Paternal Grandmother    Sudden death Neg Hx    Hyperlipidemia Neg Hx    Heart attack Neg Hx  Diabetes Neg Hx     Current Outpatient Medications:    acetaminophen (TYLENOL 8 HOUR) 650 MG CR tablet, Take 1 tablet (650 mg total) by mouth every 8 (eight) hours as needed for pain., Disp: , Rfl:    amoxicillin-clavulanate (AUGMENTIN) 875-125 MG tablet, Take 1 tablet by mouth 2 (two) times daily for 7 days., Disp: 14 tablet, Rfl: 0   olopatadine (PATANOL) 0.1 % ophthalmic solution, Place 1 drop into both eyes daily at 12 noon., Disp: 5 mL, Rfl: 0   Polyethyl Glycol-Propyl Glycol  (SYSTANE) 0.4-0.3 % GEL ophthalmic gel, Place 1 Application into both eyes as needed., Disp: , Rfl: 0   VITAMIN D, CHOLECALCIFEROL, PO, Take 1 tablet by mouth daily., Disp: , Rfl:    cetirizine (ZYRTEC) 10 MG tablet, Take 1 tablet (10 mg total) by mouth at bedtime. (Patient not taking: Reported on 03/22/2024), Disp: 30 tablet, Rfl: 0   cyclobenzaprine (FLEXERIL) 5 MG tablet, Take 1 tablet (5 mg total) by mouth 2 (two) times daily. (Patient not taking: Reported on 03/22/2024), Disp: 14 tablet, Rfl: 0   fluticasone (FLONASE) 50 MCG/ACT nasal spray, Place 2 sprays into both nostrils daily., Disp: 16 g, Rfl: 1   methocarbamol (ROBAXIN-750) 750 MG tablet, Take 1 tablet (750 mg total) by mouth every 8 (eight) hours as needed for muscle spasms. (Patient not taking: Reported on 03/22/2024), Disp: 21 tablet, Rfl: 0   mupirocin ointment (BACTROBAN) 2 %, Apply 1 Application topically 2 (two) times daily. Apply with dressing change (Patient not taking: Reported on 03/22/2024), Disp: 15 g, Rfl: 0   norethindrone-ethinyl estradiol-FE (JUNEL FE 1/20) 1-20 MG-MCG tablet, Take 1 tablet by mouth daily. (Patient not taking: Reported on 03/22/2024), Disp: , Rfl:   Current Facility-Administered Medications:    triamcinolone acetonide (KENALOG-40) injection 40 mg, 40 mg, Intramuscular, Once,  No Known Allergies   ROS: A complete ROS was performed with pertinent positives/negatives noted in the HPI. The remainder of the ROS are negative.    Objective:   Today's Vitals   03/22/24 1309  BP: 110/74  Pulse: 76  Temp: 98.2 F (36.8 C)  TempSrc: Temporal  SpO2: 98%  Weight: 266 lb 6.4 oz (120.8 kg)  Height: 5\' 5"  (1.651 m)    GENERAL: Well-appearing, in NAD. Well nourished.  SKIN: Pink, warm and dry. No rash.  HEENT:    HEAD: Normocephalic, non-traumatic.  EYES: Conjunctive pink without exudate. PERRL.  EARS: External ear w/o redness, swelling, masses, or lesions. EAC clear. TM's intact, translucent w/o bulging,  appropriate landmarks visualized.  NOSE: Septum midline w/o deformity. Nares patent, mucosa pink and inflamed with drainage. Frontal and maxillary sinus tenderness.  THROAT: Uvula midline. Oropharynx erythematous with cobblestoning. Tonsils non-inflamed w/o exudate. Mucus membranes pink and moist.  NECK: Trachea midline. Full ROM w/o pain or tenderness. No lymphadenopathy.  RESPIRATORY: Chest wall symmetrical. Respirations even and non-labored. Breath sounds clear to auscultation bilaterally.  CARDIAC: S1, S2 present, regular rate and rhythm. Peripheral pulses 2+ bilaterally.  EXTREMITIES: Without clubbing, cyanosis, or edema.  NEUROLOGIC: Steady, even gait.  PSYCH/MENTAL STATUS: Alert, oriented x 3. Cooperative, appropriate mood and affect.    No results found for any visits on 03/22/24.    Assessment & Plan:  1. Acute non-recurrent pansinusitis (Primary) - amoxicillin-clavulanate (AUGMENTIN) 875-125 MG tablet; Take 1 tablet by mouth 2 (two) times daily for 7 days.  Dispense: 14 tablet; Refill: 0 - triamcinolone acetonide (KENALOG-40) injection 40 mg - fluticasone (FLONASE) 50 MCG/ACT nasal spray; Place 2  sprays into both nostrils daily.  Dispense: 16 g; Refill: 1 - OTC sinus rinse (nedipot or neilmed)  - continue Zyrtec  - can take ibuprofen or tylenol if needed for pain    Meds ordered this encounter  Medications   amoxicillin-clavulanate (AUGMENTIN) 875-125 MG tablet    Sig: Take 1 tablet by mouth 2 (two) times daily for 7 days.    Dispense:  14 tablet    Refill:  0    Supervising Provider:   Garnette Gunner [[id number]]   triamcinolone acetonide (KENALOG-40) injection 40 mg   fluticasone (FLONASE) 50 MCG/ACT nasal spray    Sig: Place 2 sprays into both nostrils daily.    Dispense:  16 g    Refill:  1    Supervising Provider:   Garnette Gunner [[id number]]   No orders of the defined types were placed in this encounter.  Lab Orders  No laboratory test(s) ordered today   No  images are attached to the encounter or orders placed in the encounter.  Return if symptoms worsen or fail to improve.   Salvatore Decent, FNP

## 2024-03-26 ENCOUNTER — Ambulatory Visit: Admitting: Physician Assistant

## 2024-04-10 DIAGNOSIS — E8889 Other specified metabolic disorders: Secondary | ICD-10-CM | POA: Diagnosis not present

## 2024-04-10 DIAGNOSIS — E611 Iron deficiency: Secondary | ICD-10-CM | POA: Diagnosis not present

## 2024-04-10 DIAGNOSIS — Z1331 Encounter for screening for depression: Secondary | ICD-10-CM | POA: Diagnosis not present

## 2024-04-10 DIAGNOSIS — E782 Mixed hyperlipidemia: Secondary | ICD-10-CM | POA: Diagnosis not present

## 2024-04-10 DIAGNOSIS — E559 Vitamin D deficiency, unspecified: Secondary | ICD-10-CM | POA: Diagnosis not present

## 2024-04-10 DIAGNOSIS — M2550 Pain in unspecified joint: Secondary | ICD-10-CM | POA: Diagnosis not present

## 2024-04-24 DIAGNOSIS — E559 Vitamin D deficiency, unspecified: Secondary | ICD-10-CM | POA: Diagnosis not present

## 2024-04-24 DIAGNOSIS — E782 Mixed hyperlipidemia: Secondary | ICD-10-CM | POA: Diagnosis not present

## 2024-04-24 DIAGNOSIS — M2550 Pain in unspecified joint: Secondary | ICD-10-CM | POA: Diagnosis not present

## 2024-04-24 DIAGNOSIS — E611 Iron deficiency: Secondary | ICD-10-CM | POA: Diagnosis not present

## 2024-05-08 DIAGNOSIS — E782 Mixed hyperlipidemia: Secondary | ICD-10-CM | POA: Diagnosis not present

## 2024-05-08 DIAGNOSIS — E559 Vitamin D deficiency, unspecified: Secondary | ICD-10-CM | POA: Diagnosis not present

## 2024-05-08 DIAGNOSIS — M2550 Pain in unspecified joint: Secondary | ICD-10-CM | POA: Diagnosis not present

## 2024-05-08 DIAGNOSIS — E611 Iron deficiency: Secondary | ICD-10-CM | POA: Diagnosis not present

## 2024-05-29 DIAGNOSIS — E782 Mixed hyperlipidemia: Secondary | ICD-10-CM | POA: Diagnosis not present

## 2024-05-29 DIAGNOSIS — E559 Vitamin D deficiency, unspecified: Secondary | ICD-10-CM | POA: Diagnosis not present

## 2024-05-29 DIAGNOSIS — E611 Iron deficiency: Secondary | ICD-10-CM | POA: Diagnosis not present

## 2024-05-29 DIAGNOSIS — M2550 Pain in unspecified joint: Secondary | ICD-10-CM | POA: Diagnosis not present

## 2024-06-26 DIAGNOSIS — M2550 Pain in unspecified joint: Secondary | ICD-10-CM | POA: Diagnosis not present

## 2024-06-26 DIAGNOSIS — E782 Mixed hyperlipidemia: Secondary | ICD-10-CM | POA: Diagnosis not present

## 2024-06-26 DIAGNOSIS — E611 Iron deficiency: Secondary | ICD-10-CM | POA: Diagnosis not present

## 2024-06-26 DIAGNOSIS — E559 Vitamin D deficiency, unspecified: Secondary | ICD-10-CM | POA: Diagnosis not present

## 2024-07-09 DIAGNOSIS — J343 Hypertrophy of nasal turbinates: Secondary | ICD-10-CM | POA: Diagnosis not present

## 2024-07-09 DIAGNOSIS — J309 Allergic rhinitis, unspecified: Secondary | ICD-10-CM | POA: Diagnosis not present

## 2024-07-09 DIAGNOSIS — H9191 Unspecified hearing loss, right ear: Secondary | ICD-10-CM | POA: Diagnosis not present

## 2024-07-09 DIAGNOSIS — H6991 Unspecified Eustachian tube disorder, right ear: Secondary | ICD-10-CM | POA: Diagnosis not present

## 2024-07-24 DIAGNOSIS — D72820 Lymphocytosis (symptomatic): Secondary | ICD-10-CM | POA: Diagnosis not present

## 2024-07-24 DIAGNOSIS — E611 Iron deficiency: Secondary | ICD-10-CM | POA: Diagnosis not present

## 2024-07-24 DIAGNOSIS — E559 Vitamin D deficiency, unspecified: Secondary | ICD-10-CM | POA: Diagnosis not present

## 2024-07-24 DIAGNOSIS — M2550 Pain in unspecified joint: Secondary | ICD-10-CM | POA: Diagnosis not present

## 2024-07-24 DIAGNOSIS — E782 Mixed hyperlipidemia: Secondary | ICD-10-CM | POA: Diagnosis not present

## 2024-09-03 DIAGNOSIS — R09A2 Foreign body sensation, throat: Secondary | ICD-10-CM | POA: Diagnosis not present

## 2024-09-03 DIAGNOSIS — J309 Allergic rhinitis, unspecified: Secondary | ICD-10-CM | POA: Diagnosis not present

## 2024-09-03 DIAGNOSIS — J384 Edema of larynx: Secondary | ICD-10-CM | POA: Diagnosis not present

## 2024-09-03 DIAGNOSIS — R0982 Postnasal drip: Secondary | ICD-10-CM | POA: Diagnosis not present

## 2024-09-05 DIAGNOSIS — E782 Mixed hyperlipidemia: Secondary | ICD-10-CM | POA: Diagnosis not present

## 2024-09-05 DIAGNOSIS — E559 Vitamin D deficiency, unspecified: Secondary | ICD-10-CM | POA: Diagnosis not present

## 2024-09-05 DIAGNOSIS — E611 Iron deficiency: Secondary | ICD-10-CM | POA: Diagnosis not present

## 2024-09-05 DIAGNOSIS — M2550 Pain in unspecified joint: Secondary | ICD-10-CM | POA: Diagnosis not present

## 2024-10-17 DIAGNOSIS — E559 Vitamin D deficiency, unspecified: Secondary | ICD-10-CM | POA: Diagnosis not present

## 2024-10-17 DIAGNOSIS — M2550 Pain in unspecified joint: Secondary | ICD-10-CM | POA: Diagnosis not present

## 2024-10-17 DIAGNOSIS — E782 Mixed hyperlipidemia: Secondary | ICD-10-CM | POA: Diagnosis not present

## 2024-10-17 DIAGNOSIS — E611 Iron deficiency: Secondary | ICD-10-CM | POA: Diagnosis not present

## 2025-01-01 ENCOUNTER — Encounter: Payer: Self-pay | Admitting: Nurse Practitioner

## 2025-01-01 ENCOUNTER — Ambulatory Visit

## 2025-01-01 ENCOUNTER — Ambulatory Visit: Admitting: Nurse Practitioner

## 2025-01-01 VITALS — BP 130/72 | HR 82 | Temp 98.7°F | Ht 63.0 in | Wt 255.0 lb

## 2025-01-01 DIAGNOSIS — R300 Dysuria: Secondary | ICD-10-CM

## 2025-01-01 DIAGNOSIS — M545 Low back pain, unspecified: Secondary | ICD-10-CM | POA: Diagnosis not present

## 2025-01-01 DIAGNOSIS — R10A2 Flank pain, left side: Secondary | ICD-10-CM | POA: Diagnosis not present

## 2025-01-01 LAB — POCT URINALYSIS DIPSTICK
Bilirubin, UA: NEGATIVE
Glucose, UA: NEGATIVE
Ketones, UA: NEGATIVE
Nitrite, UA: POSITIVE
Protein, UA: POSITIVE — AB
Spec Grav, UA: 1.015
Urobilinogen, UA: NEGATIVE U/dL — AB
pH, UA: 6

## 2025-01-01 LAB — POCT URINE PREGNANCY: Preg Test, Ur: NEGATIVE

## 2025-01-01 MED ORDER — NITROFURANTOIN MONOHYD MACRO 100 MG PO CAPS: 100.0000 mg | ORAL_CAPSULE | Freq: Two times a day (BID) | ORAL | 0 refills | Status: DC

## 2025-01-01 NOTE — Patient Instructions (Addendum)
 Go to lab Negative pregnancy You will be contacted to schedule appointment for CT Start macrobid . Maintain adequate oral hydration

## 2025-01-01 NOTE — Progress Notes (Signed)
 "  Acute Office Visit  Subjective:    Patient ID: Kathy Werner, female    DOB: 09-Sep-1976, 49 y.o.   MRN: [id number]  Chief Complaint  Patient presents with   Back Pain    Started 4- days ago sharp pain radiates from side to side worse breathing feels like it is in her ribs, having chills and dry mouth as of yesterday    Back Pain This is a new problem. The current episode started in the past 7 days. The problem occurs constantly. The problem has been gradually worsening since onset. The pain is present in the lumbar spine. The quality of the pain is described as aching. The pain does not radiate. The pain is severe. The pain is The same all the time. Exacerbated by: palpation. Stiffness is present All day. Associated symptoms include dysuria. Pertinent negatives include no abdominal pain, bladder incontinence, bowel incontinence, chest pain, fever, headaches, leg pain, numbness, paresis, paresthesias, pelvic pain, perianal numbness, tingling, weakness or weight loss. Risk factors include lack of exercise, obesity, poor posture and sedentary lifestyle. She has tried NSAIDs for the symptoms. The treatment provided mild relief.  No injury, Right side to mid back to left side lower back. Worse on left side No diarrhea or constipation No SOB, no change   Show/hide medication list[1]  Reviewed past medical and social history.  Review of Systems  Constitutional:  Negative for fever and weight loss.  Cardiovascular:  Negative for chest pain.  Gastrointestinal:  Negative for abdominal pain and bowel incontinence.  Genitourinary:  Positive for dysuria. Negative for bladder incontinence and pelvic pain.  Musculoskeletal:  Positive for back pain.  Neurological:  Negative for tingling, weakness, numbness, headaches and paresthesias.   Per HPI     Objective:    Physical Exam Vitals and nursing note reviewed.  Cardiovascular:     Pulses: Normal pulses.     Heart sounds: Normal heart sounds.   Pulmonary:     Effort: Pulmonary effort is normal.     Breath sounds: Normal breath sounds.  Abdominal:     General: There is no distension.     Tenderness: There is no abdominal tenderness. There is left CVA tenderness. There is no right CVA tenderness or guarding.  Skin:    Findings: No erythema or rash.  Neurological:     Mental Status: She is alert and oriented to person, place, and time.     BP 130/72 (BP Location: Left Arm, Patient Position: Sitting, Cuff Size: Large)   Pulse 82   Temp 98.7 F (37.1 C) (Oral)   Ht 5' 3 (1.6 m)   Wt 255 lb (115.7 kg)   LMP 11/28/2024   SpO2 97%   BMI 45.17 kg/m    Results for orders placed or performed in visit on 01/01/25  POCT urinalysis dipstick  Result Value Ref Range   Color, UA Dark Yellow    Clarity, UA Cloudy    Glucose, UA Negative Negative   Bilirubin, UA Negative    Ketones, UA Negative    Spec Grav, UA 1.015 1.010 - 1.025   Blood, UA 3+    pH, UA 6.0 5.0 - 8.0   Protein, UA Positive (A) Negative   Urobilinogen, UA negative (A) 0.2 or 1.0 E.U./dL   Nitrite, UA Positive    Leukocytes, UA Moderate (2+) (A) Negative   Appearance     Odor Yes   POCT urine pregnancy  Result Value Ref Range   Preg  Test, Ur Negative Negative       Assessment & Plan:   Problem List Items Addressed This Visit   None Visit Diagnoses       Left flank pain    -  Primary   Relevant Medications   nitrofurantoin , macrocrystal-monohydrate, (MACROBID ) 100 MG capsule   Other Relevant Orders   POCT urinalysis dipstick (Completed)   Basic metabolic panel with GFR   CBC with Differential/Platelet   CT RENAL STONE STUDY   POCT urine pregnancy (Completed)     Dysuria       Relevant Medications   nitrofurantoin , macrocrystal-monohydrate, (MACROBID ) 100 MG capsule   Other Relevant Orders   POCT urinalysis dipstick (Completed)   Basic metabolic panel with GFR   CBC with Differential/Platelet   CT RENAL STONE STUDY   POCT urine pregnancy  (Completed)      Meds ordered this encounter  Medications   nitrofurantoin , macrocrystal-monohydrate, (MACROBID ) 100 MG capsule    Sig: Take 1 capsule (100 mg total) by mouth 2 (two) times daily.    Dispense:  14 capsule    Refill:  0    Supervising Provider:   BERNETA ELSIE SAYRE [[id number]]   Return if symptoms worsen or fail to improve.    Roselie Mood, NP    [1]  Outpatient Medications Prior to Visit  Medication Sig   acetaminophen  (TYLENOL  8 HOUR) 650 MG CR tablet Take 1 tablet (650 mg total) by mouth every 8 (eight) hours as needed for pain.   ibuprofen  (ADVIL ) 200 MG tablet Take 200 mg by mouth every 6 (six) hours as needed for mild pain (pain score 1-3) or moderate pain (pain score 4-6).   olopatadine  (PATANOL) 0.1 % ophthalmic solution Place 1 drop into both eyes daily at 12 noon.   VITAMIN D , CHOLECALCIFEROL, PO Take 1 tablet by mouth daily.   fluticasone  (FLONASE ) 50 MCG/ACT nasal spray Place 2 sprays into both nostrils daily.   [DISCONTINUED] cetirizine  (ZYRTEC ) 10 MG tablet Take 1 tablet (10 mg total) by mouth at bedtime. (Patient not taking: Reported on 01/01/2025)   [DISCONTINUED] cyclobenzaprine  (FLEXERIL ) 5 MG tablet Take 1 tablet (5 mg total) by mouth 2 (two) times daily. (Patient not taking: Reported on 01/01/2025)   [DISCONTINUED] methocarbamol  (ROBAXIN -750) 750 MG tablet Take 1 tablet (750 mg total) by mouth every 8 (eight) hours as needed for muscle spasms. (Patient not taking: Reported on 03/22/2024)   [DISCONTINUED] mupirocin  ointment (BACTROBAN ) 2 % Apply 1 Application topically 2 (two) times daily. Apply with dressing change (Patient not taking: Reported on 03/22/2024)   [DISCONTINUED] norethindrone-ethinyl estradiol-FE (JUNEL FE 1/20) 1-20 MG-MCG tablet Take 1 tablet by mouth daily. (Patient not taking: Reported on 03/22/2024)   [DISCONTINUED] Polyethyl Glycol-Propyl Glycol (SYSTANE) 0.4-0.3 % GEL ophthalmic gel Place 1 Application into both eyes as needed.  (Patient not taking: Reported on 01/01/2025)   No facility-administered medications prior to visit.   "

## 2025-01-02 ENCOUNTER — Ambulatory Visit: Payer: Self-pay | Admitting: Nurse Practitioner

## 2025-01-02 DIAGNOSIS — N10 Acute tubulo-interstitial nephritis: Secondary | ICD-10-CM

## 2025-01-02 MED ORDER — CIPROFLOXACIN HCL 500 MG PO TABS: 500.0000 mg | ORAL_TABLET | Freq: Two times a day (BID) | ORAL | 0 refills | Status: AC

## 2025-01-02 NOTE — Addendum Note (Signed)
 Addended by: KATHEEN ROSELIE CROME on: 01/02/2025 08:57 AM   Modules accepted: Orders

## 2025-01-04 ENCOUNTER — Other Ambulatory Visit

## 2025-01-04 DIAGNOSIS — R300 Dysuria: Secondary | ICD-10-CM

## 2025-01-04 DIAGNOSIS — R10A2 Flank pain, left side: Secondary | ICD-10-CM

## 2025-01-04 LAB — CBC WITH DIFFERENTIAL/PLATELET
Absolute Lymphocytes: 2803 {cells}/uL (ref 850–3900)
Absolute Monocytes: 1053 {cells}/uL — ABNORMAL HIGH (ref 200–950)
Basophils Absolute: 32 {cells}/uL (ref 0–200)
Basophils Relative: 0.4 %
Eosinophils Absolute: 81 {cells}/uL (ref 15–500)
Eosinophils Relative: 1 %
HCT: 33.8 % — ABNORMAL LOW (ref 35.9–46.0)
Hemoglobin: 11.4 g/dL — ABNORMAL LOW (ref 11.7–15.5)
MCH: 30.6 pg (ref 27.0–33.0)
MCHC: 33.7 g/dL (ref 31.6–35.4)
MCV: 90.9 fL (ref 81.4–101.7)
MPV: 10.6 fL (ref 7.5–12.5)
Monocytes Relative: 13 %
Neutro Abs: 4131 {cells}/uL (ref 1500–7800)
Neutrophils Relative %: 51 %
Platelets: 293 Thousand/uL (ref 140–400)
RBC: 3.72 Million/uL — ABNORMAL LOW (ref 3.80–5.10)
RDW: 11.8 % (ref 11.0–15.0)
Total Lymphocyte: 34.6 %
WBC: 8.1 Thousand/uL (ref 3.8–10.8)

## 2025-01-04 LAB — BASIC METABOLIC PANEL WITH GFR
BUN/Creatinine Ratio: 13 (calc) (ref 6–22)
BUN: 13 mg/dL (ref 7–25)
CO2: 30 mmol/L (ref 20–32)
Calcium: 9.1 mg/dL (ref 8.6–10.2)
Chloride: 106 mmol/L (ref 98–110)
Creat: 1.04 mg/dL — ABNORMAL HIGH (ref 0.50–0.99)
Glucose, Bld: 108 mg/dL — ABNORMAL HIGH (ref 65–99)
Potassium: 3.9 mmol/L (ref 3.5–5.3)
Sodium: 144 mmol/L (ref 135–146)
eGFR: 67 mL/min/1.73m2

## 2025-01-04 NOTE — Addendum Note (Signed)
 Addended by: JENEL SMALLER D on: 01/04/2025 04:27 PM   Modules accepted: Orders

## 2025-01-07 NOTE — Addendum Note (Signed)
 Addended by: KATHEEN GARDEN L on: 01/07/2025 02:51 PM   Modules accepted: Orders

## 2025-01-11 ENCOUNTER — Other Ambulatory Visit (INDEPENDENT_AMBULATORY_CARE_PROVIDER_SITE_OTHER)

## 2025-01-11 DIAGNOSIS — N10 Acute tubulo-interstitial nephritis: Secondary | ICD-10-CM

## 2025-01-12 LAB — URINALYSIS W MICROSCOPIC + REFLEX CULTURE
Bacteria, UA: NONE SEEN /HPF
Bilirubin Urine: NEGATIVE
Glucose, UA: NEGATIVE
Hyaline Cast: NONE SEEN /LPF
Ketones, ur: NEGATIVE
Leukocyte Esterase: NEGATIVE
Nitrites, Initial: NEGATIVE
Protein, ur: NEGATIVE
RBC / HPF: NONE SEEN /HPF (ref 0–2)
Specific Gravity, Urine: 1.017 (ref 1.001–1.035)
WBC, UA: NONE SEEN /HPF (ref 0–5)
pH: 7.5 (ref 5.0–8.0)

## 2025-01-12 LAB — NO CULTURE INDICATED

## 2025-01-15 ENCOUNTER — Ambulatory Visit: Payer: Self-pay | Admitting: Nurse Practitioner
